# Patient Record
Sex: Male | Born: 1960
Health system: Southern US, Community
[De-identification: ages and names within clinical notes are randomized; demographics above are authoritative.]

## PROBLEM LIST (undated history)

## (undated) DIAGNOSIS — Z95820 Peripheral vascular angioplasty status with implants and grafts: Secondary | ICD-10-CM

## (undated) DIAGNOSIS — M069 Rheumatoid arthritis, unspecified: Secondary | ICD-10-CM

## (undated) DIAGNOSIS — E785 Hyperlipidemia, unspecified: Secondary | ICD-10-CM

## (undated) DIAGNOSIS — I314 Cardiac tamponade: Secondary | ICD-10-CM

## (undated) DIAGNOSIS — I313 Pericardial effusion (noninflammatory): Secondary | ICD-10-CM

## (undated) DIAGNOSIS — I2510 Atherosclerotic heart disease of native coronary artery without angina pectoris: Secondary | ICD-10-CM

## (undated) DIAGNOSIS — Z720 Tobacco use: Secondary | ICD-10-CM

## (undated) DIAGNOSIS — I2102 ST elevation (STEMI) myocardial infarction involving left anterior descending coronary artery: Secondary | ICD-10-CM

## (undated) DIAGNOSIS — I255 Ischemic cardiomyopathy: Secondary | ICD-10-CM

## (undated) HISTORY — DX: Rheumatoid arthritis, unspecified: M06.9

## (undated) HISTORY — PX: SHOULDER SURGERY: SHX246

---

## 2015-04-27 DIAGNOSIS — S46211A Strain of muscle, fascia and tendon of other parts of biceps, right arm, initial encounter: Secondary | ICD-10-CM | POA: Insufficient documentation

## 2015-06-12 ENCOUNTER — Encounter (HOSPITAL_COMMUNITY): Payer: Self-pay | Admitting: Family Medicine

## 2015-06-12 ENCOUNTER — Inpatient Hospital Stay (HOSPITAL_COMMUNITY)
Admission: EM | Admit: 2015-06-12 | Discharge: 2015-06-14 | DRG: 247 | Disposition: A | Discharge status: Home / Self Care | Payer: Medicaid Other | Attending: Cardiovascular Disease | Admitting: Cardiovascular Disease

## 2015-06-12 ENCOUNTER — Encounter (HOSPITAL_COMMUNITY)
Admission: EM | Disposition: A | Discharge status: Home / Self Care | Payer: Medicaid Other | Source: Home / Self Care | Attending: Cardiovascular Disease

## 2015-06-12 DIAGNOSIS — I2510 Atherosclerotic heart disease of native coronary artery without angina pectoris: Secondary | ICD-10-CM | POA: Diagnosis present

## 2015-06-12 DIAGNOSIS — I255 Ischemic cardiomyopathy: Secondary | ICD-10-CM | POA: Diagnosis present

## 2015-06-12 DIAGNOSIS — I219 Acute myocardial infarction, unspecified: Secondary | ICD-10-CM

## 2015-06-12 DIAGNOSIS — I2102 ST elevation (STEMI) myocardial infarction involving left anterior descending coronary artery: Principal | ICD-10-CM | POA: Diagnosis present

## 2015-06-12 DIAGNOSIS — Z791 Long term (current) use of non-steroidal anti-inflammatories (NSAID): Secondary | ICD-10-CM

## 2015-06-12 DIAGNOSIS — I213 ST elevation (STEMI) myocardial infarction of unspecified site: Secondary | ICD-10-CM

## 2015-06-12 DIAGNOSIS — E785 Hyperlipidemia, unspecified: Secondary | ICD-10-CM | POA: Diagnosis present

## 2015-06-12 DIAGNOSIS — Z7952 Long term (current) use of systemic steroids: Secondary | ICD-10-CM

## 2015-06-12 DIAGNOSIS — F17210 Nicotine dependence, cigarettes, uncomplicated: Secondary | ICD-10-CM | POA: Diagnosis present

## 2015-06-12 DIAGNOSIS — Z95820 Peripheral vascular angioplasty status with implants and grafts: Secondary | ICD-10-CM

## 2015-06-12 DIAGNOSIS — I2109 ST elevation (STEMI) myocardial infarction involving other coronary artery of anterior wall: Secondary | ICD-10-CM | POA: Diagnosis not present

## 2015-06-12 DIAGNOSIS — R079 Chest pain, unspecified: Secondary | ICD-10-CM | POA: Diagnosis not present

## 2015-06-12 HISTORY — DX: ST elevation (STEMI) myocardial infarction involving left anterior descending coronary artery: I21.02

## 2015-06-12 HISTORY — DX: Tobacco use: Z72.0

## 2015-06-12 HISTORY — PX: CARDIAC CATHETERIZATION: SHX172

## 2015-06-12 HISTORY — DX: Peripheral vascular angioplasty status with implants and grafts: Z95.820

## 2015-06-12 HISTORY — DX: Ischemic cardiomyopathy: I25.5

## 2015-06-12 HISTORY — DX: Hyperlipidemia, unspecified: E78.5

## 2015-06-12 HISTORY — DX: Atherosclerotic heart disease of native coronary artery without angina pectoris: I25.10

## 2015-06-12 LAB — COMPREHENSIVE METABOLIC PANEL
ALT: 17 U/L (ref 17–63)
ANION GAP: 13 (ref 5–15)
AST: 25 U/L (ref 15–41)
Albumin: 4.3 g/dL (ref 3.5–5.0)
Alkaline Phosphatase: 86 U/L (ref 38–126)
BILIRUBIN TOTAL: 0.3 mg/dL (ref 0.3–1.2)
BUN: 20 mg/dL (ref 6–20)
CALCIUM: 9.2 mg/dL (ref 8.9–10.3)
CO2: 23 mmol/L (ref 22–32)
CREATININE: 0.82 mg/dL (ref 0.61–1.24)
Chloride: 102 mmol/L (ref 101–111)
GFR calc non Af Amer: >60 mL/min (ref >60)
Glucose, Bld: 161 mg/dL — ABNORMAL HIGH (ref 65–99)
POTASSIUM: 3.5 mmol/L (ref 3.5–5.1)
Sodium: 138 mmol/L (ref 135–145)
TOTAL PROTEIN: 7 g/dL (ref 6.5–8.1)

## 2015-06-12 LAB — CBC
HCT: 47.4 % (ref 39.0–52.0)
Hemoglobin: 16.5 g/dL (ref 13.0–17.0)
MCH: 34.5 pg — AB (ref 26.0–34.0)
MCHC: 34.8 g/dL (ref 30.0–36.0)
MCV: 99.2 fL (ref 78.0–100.0)
PLATELETS: 255 10*3/uL (ref 150–400)
RBC: 4.78 MIL/uL (ref 4.22–5.81)
RDW: 12.6 % (ref 11.5–15.5)
WBC: 19.6 10*3/uL — ABNORMAL HIGH (ref 4.0–10.5)

## 2015-06-12 LAB — PROTIME-INR
INR: 1.08 (ref 0.00–1.49)
PROTHROMBIN TIME: 14.2 s (ref 11.6–15.2)

## 2015-06-12 LAB — I-STAT TROPONIN, ED: TROPONIN I, POC: 0.67 ng/mL — AB (ref 0.00–0.08)

## 2015-06-12 LAB — TROPONIN I

## 2015-06-12 LAB — APTT: APTT: 33 s (ref 24–37)

## 2015-06-12 SURGERY — LEFT HEART CATH AND CORONARY ANGIOGRAPHY
Anesthesia: LOCAL

## 2015-06-12 MED ORDER — MORPHINE SULFATE 2 MG/ML IJ SOLN: 2.0000 mg | INTRAMUSCULAR | Status: DC | PRN

## 2015-06-12 MED ORDER — IOHEXOL 350 MG/ML SOLN
INTRAVENOUS | Status: DC | PRN
  Administered 2015-06-12: 135 mL via INTRA_ARTERIAL

## 2015-06-12 MED ORDER — ASPIRIN 81 MG PO CHEW
CHEWABLE_TABLET | ORAL | Status: AC
  Administered 2015-06-12: 324 mg via ORAL
  Filled 2015-06-12: qty 4

## 2015-06-12 MED ORDER — BIVALIRUDIN 250 MG IV SOLR
INTRAVENOUS | Status: AC
  Filled 2015-06-12: qty 250

## 2015-06-12 MED ORDER — SODIUM CHLORIDE 0.9 % IJ SOLN
3.0000 mL | Freq: Two times a day (BID) | INTRAMUSCULAR | Status: DC
Start: 2015-06-12 — End: 2015-06-14
  Administered 2015-06-12 – 2015-06-13 (×3): 3 mL via INTRAVENOUS

## 2015-06-12 MED ORDER — MORPHINE SULFATE 4 MG/ML IJ SOLN
4.0000 mg | INTRAMUSCULAR | Status: DC | PRN
  Administered 2015-06-12: 4 mg via INTRAVENOUS

## 2015-06-12 MED ORDER — ONDANSETRON HCL 4 MG/2ML IJ SOLN: 4.0000 mg | Freq: Four times a day (QID) | INTRAMUSCULAR | Status: DC | PRN

## 2015-06-12 MED ORDER — FENTANYL CITRATE (PF) 100 MCG/2ML IJ SOLN
INTRAMUSCULAR | Status: DC | PRN
  Administered 2015-06-12: 50 ug via INTRAVENOUS

## 2015-06-12 MED ORDER — MORPHINE SULFATE 2 MG/ML IJ SOLN
INTRAMUSCULAR | Status: AC
  Filled 2015-06-12: qty 2

## 2015-06-12 MED ORDER — TICAGRELOR 90 MG PO TABS
ORAL_TABLET | ORAL | Status: AC
  Filled 2015-06-12: qty 2

## 2015-06-12 MED ORDER — SODIUM CHLORIDE 0.9 % IV SOLN: 250.0000 mL | INTRAVENOUS | Status: DC | PRN

## 2015-06-12 MED ORDER — VERAPAMIL HCL 2.5 MG/ML IV SOLN
INTRAVENOUS | Status: AC
  Filled 2015-06-12: qty 2

## 2015-06-12 MED ORDER — NITROGLYCERIN IN D5W 200-5 MCG/ML-% IV SOLN
5.0000 ug/min | INTRAVENOUS | Status: DC
  Administered 2015-06-12: 5 ug/min via INTRAVENOUS

## 2015-06-12 MED ORDER — RADIAL COCKTAIL (HEPARIN/VERAPAMIL/LIDOCAINE/NITRO)
Status: DC | PRN
  Administered 2015-06-12: 1 via INTRA_ARTERIAL

## 2015-06-12 MED ORDER — ACETAMINOPHEN 325 MG PO TABS: 650.0000 mg | ORAL_TABLET | ORAL | Status: DC | PRN

## 2015-06-12 MED ORDER — SODIUM CHLORIDE 0.9 % IV SOLN
250.0000 mg | INTRAVENOUS | Status: DC | PRN
  Administered 2015-06-12: 1.75 mg/kg/h via INTRAVENOUS
  Administered 2015-06-12: 250 mg

## 2015-06-12 MED ORDER — ASPIRIN 81 MG PO CHEW
81.0000 mg | CHEWABLE_TABLET | Freq: Every day | ORAL | Status: DC
  Administered 2015-06-13 – 2015-06-14 (×2): 81 mg via ORAL
  Filled 2015-06-12 (×2): qty 1

## 2015-06-12 MED ORDER — SODIUM CHLORIDE 0.9 % WEIGHT BASED INFUSION: 1.0000 mL/kg/h | INTRAVENOUS | Status: AC

## 2015-06-12 MED ORDER — NITROGLYCERIN IN D5W 200-5 MCG/ML-% IV SOLN
INTRAVENOUS | Status: AC
  Administered 2015-06-12: 5 ug/min via INTRAVENOUS
  Filled 2015-06-12: qty 250

## 2015-06-12 MED ORDER — TICAGRELOR 90 MG PO TABS
ORAL_TABLET | ORAL | Status: DC | PRN
  Administered 2015-06-12: 180 mg via ORAL

## 2015-06-12 MED ORDER — BIVALIRUDIN BOLUS VIA INFUSION - CUPID
INTRAVENOUS | Status: DC | PRN
  Administered 2015-06-12: 57.825 mg via INTRAVENOUS

## 2015-06-12 MED ORDER — HEPARIN SODIUM (PORCINE) 5000 UNIT/ML IJ SOLN
INTRAMUSCULAR | Status: AC
  Administered 2015-06-12: 4000 [IU] via INTRAVENOUS
  Filled 2015-06-12: qty 1

## 2015-06-12 MED ORDER — ATORVASTATIN CALCIUM 80 MG PO TABS
80.0000 mg | ORAL_TABLET | Freq: Every day | ORAL | Status: DC
Start: 2015-06-12 — End: 2015-06-14
  Administered 2015-06-13: 80 mg via ORAL
  Filled 2015-06-12 (×2): qty 1

## 2015-06-12 MED ORDER — ONDANSETRON HCL 4 MG/2ML IJ SOLN
4.0000 mg | Freq: Once | INTRAMUSCULAR | Status: AC
  Administered 2015-06-12: 4 mg via INTRAVENOUS

## 2015-06-12 MED ORDER — SODIUM CHLORIDE 0.9 % IV SOLN
INTRAVENOUS | Status: DC
  Administered 2015-06-12: 20 mL/h via INTRAVENOUS

## 2015-06-12 MED ORDER — FENTANYL CITRATE (PF) 100 MCG/2ML IJ SOLN
INTRAMUSCULAR | Status: AC
  Filled 2015-06-12: qty 4

## 2015-06-12 MED ORDER — SODIUM CHLORIDE 0.9 % IJ SOLN: 3.0000 mL | INTRAMUSCULAR | Status: DC | PRN

## 2015-06-12 MED ORDER — PNEUMOCOCCAL VAC POLYVALENT 25 MCG/0.5ML IJ INJ
0.5000 mL | INJECTION | INTRAMUSCULAR | Status: AC
  Administered 2015-06-13: 0.5 mL via INTRAMUSCULAR
  Filled 2015-06-12: qty 0.5

## 2015-06-12 MED ORDER — LIDOCAINE HCL (PF) 1 % IJ SOLN
INTRAMUSCULAR | Status: AC
  Filled 2015-06-12: qty 30

## 2015-06-12 MED ORDER — HEPARIN SODIUM (PORCINE) 5000 UNIT/ML IJ SOLN
4000.0000 [IU] | INTRAMUSCULAR | Status: AC
  Administered 2015-06-12: 4000 [IU] via INTRAVENOUS

## 2015-06-12 MED ORDER — SODIUM CHLORIDE 0.9 % IV SOLN
1.0000 mg/kg/h | INTRAVENOUS | Status: AC
  Filled 2015-06-12: qty 250

## 2015-06-12 MED ORDER — ASPIRIN 81 MG PO CHEW
324.0000 mg | CHEWABLE_TABLET | Freq: Once | ORAL | Status: AC
  Administered 2015-06-12: 324 mg via ORAL

## 2015-06-12 MED ORDER — ONDANSETRON HCL 4 MG/2ML IJ SOLN
INTRAMUSCULAR | Status: AC
  Filled 2015-06-12: qty 2

## 2015-06-12 MED ORDER — TICAGRELOR 90 MG PO TABS
90.0000 mg | ORAL_TABLET | Freq: Two times a day (BID) | ORAL | Status: DC
  Administered 2015-06-12 – 2015-06-14 (×4): 90 mg via ORAL
  Filled 2015-06-12 (×5): qty 1

## 2015-06-12 MED ORDER — HEPARIN (PORCINE) IN NACL 2-0.9 UNIT/ML-% IJ SOLN
INTRAMUSCULAR | Status: AC
  Filled 2015-06-12: qty 1000

## 2015-06-12 SURGICAL SUPPLY — 20 items
BALLN MINITREK RX 2.0X12 (BALLOONS) ×2
BALLN ~~LOC~~ EUPHORA RX 2.75X12 (BALLOONS) ×2
BALLOON MINITREK RX 2.0X12 (BALLOONS) ×1 IMPLANT
BALLOON ~~LOC~~ EUPHORA RX 2.75X12 (BALLOONS) ×1 IMPLANT
CATH INFINITI 5FR ANG PIGTAIL (CATHETERS) ×2 IMPLANT
CATH INFINITI 5FR MULTPACK ANG (CATHETERS) ×2 IMPLANT
CATH OPTITORQUE TIG 4.0 5F (CATHETERS) ×2 IMPLANT
CATH VISTA GUIDE 6FR XBLAD3.5 (CATHETERS) ×2 IMPLANT
DEVICE RAD COMP TR BAND LRG (VASCULAR PRODUCTS) ×2 IMPLANT
GLIDESHEATH SLEND A-KIT 6F 22G (SHEATH) ×2 IMPLANT
KIT ENCORE 26 ADVANTAGE (KITS) ×2 IMPLANT
KIT HEART LEFT (KITS) ×2 IMPLANT
PACK CARDIAC CATHETERIZATION (CUSTOM PROCEDURE TRAY) ×2 IMPLANT
STENT XIENCE ALPINE RX 2.5X15 (Permanent Stent) ×2 IMPLANT
SYR MEDRAD MARK V 150ML (SYRINGE) ×2 IMPLANT
TRANSDUCER W/STOPCOCK (MISCELLANEOUS) ×2 IMPLANT
TUBING CIL FLEX 10 FLL-RA (TUBING) ×2 IMPLANT
WIRE ASAHI PROWATER 180CM (WIRE) ×2 IMPLANT
WIRE HI TORQ VERSACORE-J 145CM (WIRE) ×2 IMPLANT
WIRE SAFE-T 1.5MM-J .035X260CM (WIRE) ×2 IMPLANT

## 2015-06-12 NOTE — H&P (Signed)
CARDIOLOGY ADMISSION NOTE  Patient ID: Devin Ellis MRN: [id number] DOB/AGE: 12-04-1960 54 y.o.  Admit date: 06/12/2015 Primary Physician   Dennie Fetters Primary Cardiologist   New Chief Complaint    Chest pain  HPI:  The patient presents with two hours of chest pain after moving a bed.  He and his wife are moving to a new apartment.  He has no past medical history other than tobacco.  He reports 10/10 chest and left arm pain.  He denies an associated N/V or diaphoresis.  He came to the ED and is noted to have ST elevation V2 - V4 of 4 mm with 5 - 10 mm inferior ST depression.  Treated with ASA and heparin.  Code STEMI activated.   Past Medical History  Diagnosis Date  . Tobacco abuse     Past Surgical History  Procedure Laterality Date  . Shoulder surgery Right     Torn bicep    No Known Allergies No current facility-administered medications on file prior to encounter.   MEDS:  Occasional pain med for arm   Social History   Social History  . Marital Status: N/A    Spouse Name: N/A  . Number of Children: 5  . Years of Education: N/A   Occupational History  . Not on file.   Social History Main Topics  . Smoking status: Current Every Day Smoker -- 1.50 packs/day for 30 years    Types: Cigarettes  . Smokeless tobacco: Not on file  . Alcohol Use: Not on file  . Drug Use: Not on file  . Sexual Activity: Not on file   Other Topics Concern  . Not on file   Social History Narrative   Married.  Five children total between him and his wife.  He works for [employer] in [employer].      FH:  No early CAD   ROS:  As stated in the HPI and negative for all other systems.  Physical Exam: Blood pressure 157/91, pulse 80, resp. rate 18, height 6\' 1"  (1.854 m), weight 170 lb (77.111 kg), SpO2 97 %.  GENERAL:  Well appearing HEENT:  Pupils equal round and reactive, fundi not visualized, oral mucosa unremarkable NECK:  No jugular venous distention, waveform within normal  limits, carotid upstroke brisk and symmetric, no bruits, no thyromegaly LYMPHATICS:  No cervical, inguinal adenopathy LUNGS:  Clear to auscultation bilaterally BACK:  No CVA tenderness CHEST:  Unremarkable HEART:  PMI not displaced or sustained,S1 and S2 within normal limits, no S3, no S4, no clicks, no rubs, no murmurs ABD:  Flat, positive bowel sounds normal in frequency in pitch, no bruits, no rebound, no guarding, no midline pulsatile mass, no hepatomegaly, no splenomegaly EXT:  2 plus pulses throughout, no edema, no cyanosis no clubbing SKIN:  No rashes no nodules NEURO:  Cranial nerves II through XII grossly intact, motor grossly intact throughout PSYCH:  Cognitively intact, oriented to person place and time    Labs: No results found for: BUN No results found for: CREATININE No results found for: NA, K, CL, CO2 No results found for: TROPONINI Lab Results  Component Value Date   WBC 19.6* 06/12/2015   HGB 16.5 06/12/2015   HCT 47.4 06/12/2015   MCV 99.2 06/12/2015   PLT 255 06/12/2015   No results found for: CHOL, HDL, LDLCALC, LDLDIRECT, TRIG, CHOLHDL No results found for: ALT, AST, GGT, ALKPHOS, BILITOT   EKG:  NSR, rate 88, axis WNL, intervals WNL,  acute anterior ST elevation MI with reciprocal inferior ST depression  ASSESSMENT AND PLAN:    Acute anterior MI:  The patient is to be taken urgently to the cath lab.  The patient understands that risks included but are not limited to stroke (1 in 1000), death (1 in 1000), kidney failure [usually temporary] (1 in 500), bleeding (1 in 200), allergic reaction [possibly serious] (1 in 200).  The patient understands and agrees to proceed.   Tobacco abuse:  He will be educated.  Risk reduction:  Check lipid profile and start statin.    SignedRollene Rotunda 06/12/2015, 4:36 PM

## 2015-06-12 NOTE — ED Notes (Signed)
[facility] contacted to call Code Stemi 

## 2015-06-12 NOTE — ED Notes (Signed)
Pt here for chest pain that started this am. Pt code stemi.

## 2015-06-12 NOTE — ED Notes (Signed)
Cardiologist at bedside.  

## 2015-06-12 NOTE — Progress Notes (Signed)
Chaplain offered support and orientation to [employer] process and facilitated storytelling.  Accompanied wife to waiting area and returned to escort wife's daughter ([redacted] weeks pregnant) to waiting area.  Patient and wife were in the middle of moving to another apartment within the same complex when patient's pain started about 4 hours ago.  Wife is anxious and teary. She reports concern about getting moved out of the old apartment in time so they don't get charged for two places, about financial and employment status of patient and over her children.  In addition to wife's daughter, wife has a son (32) who is very close to his stepfather (patient) and another son (73, assumed to be biological son of patient bc patient and wife have been married for 12 years).  This youngest son has not been told what is happening, but he is with his 49 year old step-brother who knows situation.  Youngest son is also very close to his father. Chaplain encouraged mom to communicate some basic information to younger son, for example that dad had to come to the hospital to be checked out and is getting taken care, because children will usually fill in information gaps with worst-case scenarios. Chaplain explained that our services are available at any time, day or night.  Please contact for f/u care as needed.   Theodoro Parma [phone]   06/12/15 1700  Clinical Encounter Type  Visited With Patient and family together  Visit Type Initial;Psychological support (STEMI)  Stress Factors  Patient Stress Factors Health changes  Family Stress Factors Lack of knowledge;Exhausted

## 2015-06-12 NOTE — ED Provider Notes (Signed)
CSN: [id number]     Arrival date & time 06/12/15  1555 History   First MD Initiated Contact with Patient 06/12/15 1627     Chief Complaint  Patient presents with  . Code STEMI     HPI  Patient presents for evaluation she complained of chest pain. Patient presents via triage and was brought emergently to room after triage with complaint of chest pain. Symptoms started 2 hours ago while at home at rest. Pressure in his anterior chest rating to his left arm associated shortness of breath. No syncope. No nausea. No cough fever or other recent illnesses.  History of heart disease. Denies history of hypertension or diabetes. No family history of heart disease. [name] a 1.5 pack per day smoker for over 30 years.  Past Medical History  Diagnosis Date  . Tobacco abuse    Past Surgical History  Procedure Laterality Date  . Shoulder surgery Right     Torn bicep   History reviewed. No pertinent family history. Social History  Substance Use Topics  . Smoking status: Current Every Day Smoker -- 1.50 packs/day for 30 years    Types: Cigarettes  . Smokeless tobacco: None  . Alcohol Use: None    Review of Systems  Constitutional: Negative for fever, chills, diaphoresis, appetite change and fatigue.  HENT: Negative for mouth sores, sore throat and trouble swallowing.   Eyes: Negative for visual disturbance.  Respiratory: Positive for chest tightness and shortness of breath. Negative for cough and wheezing.   Cardiovascular: Positive for chest pain.  Gastrointestinal: Positive for nausea. Negative for vomiting, abdominal pain, diarrhea and abdominal distention.  Endocrine: Negative for polydipsia, polyphagia and polyuria.  Genitourinary: Negative for dysuria, frequency and hematuria.  Musculoskeletal: Negative for gait problem.  Skin: Negative for color change, pallor and rash.  Neurological: Negative for dizziness, syncope, light-headedness and headaches.  Hematological: Does not  bruise/bleed easily.  Psychiatric/Behavioral: Negative for behavioral problems and confusion.      Allergies  Review of patient's allergies indicates no known allergies.  Home Medications   Prior to Admission medications   Medication Sig Start Date End Date Taking? Authorizing Provider  meloxicam (MOBIC) 15 MG tablet Take 15 mg by mouth at bedtime.  04/27/15  Yes Historical Provider, MD  naproxen sodium (ANAPROX) 220 MG tablet Take 440 mg by mouth 3 (three) times daily as needed (for pain).   Yes Historical Provider, MD  predniSONE (DELTASONE) 10 MG tablet Take 5 mg by mouth every other day. 04/27/15  Yes Historical Provider, MD   BP 137/89 mmHg  Pulse 80  Temp(Src) 98.6 F (37 C) (Oral)  Resp 17  Ht 6' (1.829 m)  Wt 169 lb 15.6 oz (77.1 kg)  BMI 23.05 kg/m2  SpO2 98% Physical Exam  Constitutional: [name] oriented to person, place, and time. He appears well-developed and well-nourished. No distress.  Appears uncomfortable. Appears acutely distressed. [name] diaphoretic.  HENT:  Head: Normocephalic.  Eyes: Conjunctivae are normal. Pupils are equal, round, and reactive to light. No scleral icterus.  Neck: Normal range of motion. Neck supple. No thyromegaly present.  Cardiovascular: Normal rate, regular rhythm, S1 normal and S2 normal.  Exam reveals no gallop and no friction rub.   No murmur heard. No murmurs. Sinus rhythm. Regular.  Pulmonary/Chest: Effort normal and breath sounds normal. No respiratory distress. He has no wheezes. He has no rales.  Clear bilateral breath sounds.  Abdominal: Soft. Bowel sounds are normal. He exhibits no distension. There  is no tenderness. There is no rebound.  Musculoskeletal: Normal range of motion.  Neurological: [name] alert and oriented to person, place, and time.  Skin: Skin is warm and dry. No rash noted.  Psychiatric: He has a normal mood and affect. His behavior is normal.    ED Course  Procedures (including critical care time) Labs  Review Labs Reviewed  CBC - Abnormal; Notable for the following:    WBC 19.6 (*)    MCH 34.5 (*)    All other components within normal limits  COMPREHENSIVE METABOLIC PANEL - Abnormal; Notable for the following:    Glucose, Bld 161 (*)    All other components within normal limits  TROPONIN I - Abnormal; Notable for the following:    Troponin I >65.00 (*)    All other components within normal limits  I-STAT TROPOININ, ED - Abnormal; Notable for the following:    Troponin i, poc 0.67 (*)    All other components within normal limits  MRSA PCR SCREENING  APTT  PROTIME-INR  BASIC METABOLIC PANEL  CBC  TROPONIN I  TROPONIN I    Imaging Review No results found. Suan Halter, [employer], personally reviewed and evaluated these images and lab results as part of my medical decision-making. EKG: normal EKG, normal sinus rhythm, unchanged from previous tracings, ST elevation in anterior leads greater than 37mm.   MDM   Final diagnoses:  ST elevation myocardial infarction (STEMI), unspecified artery    Immediately upon being shown the EKG when directly patient's room. [name] complaining of chest pain. Immediately requested that a code STEMI be placed. Dr. Gery Pray did return my call. However, Dr. Antoine Poche was immediately available in the emergency room, and has "a consultation while awaiting Dr. Benay Spice arrival.   After my initial evaluation and history the patient was given heparin bolus. Was given morphine and Zofran IV. Was given aspirin by mouth.  CRITICAL CARE Performed by: Rolland Porter JOSEPH   Total critical care time: 30 minutes.  Initial evaluation. Activation of code STEMI. Heparin bolus.  Critical care time was exclusive of separately billable procedures and treating other patients.  Critical care was necessary to treat or prevent imminent or life-threatening deterioration.  Critical care was time spent personally by me on the following activities: development of treatment  plan with patient and/or surrogate as well as nursing, discussions with consultants, evaluation of patient's response to treatment, examination of patient, obtaining history from patient or surrogate, ordering and performing treatments and interventions, ordering and review of laboratory studies, ordering and review of radiographic studies, pulse oximetry and re-evaluation of patient's condition.    Rolland Porter, MD 06/13/15 [phone]

## 2015-06-13 ENCOUNTER — Inpatient Hospital Stay (HOSPITAL_COMMUNITY): Payer: Medicaid Other

## 2015-06-13 DIAGNOSIS — I219 Acute myocardial infarction, unspecified: Secondary | ICD-10-CM

## 2015-06-13 DIAGNOSIS — I213 ST elevation (STEMI) myocardial infarction of unspecified site: Secondary | ICD-10-CM

## 2015-06-13 LAB — CBC
HEMATOCRIT: 43.4 % (ref 39.0–52.0)
HEMOGLOBIN: 14.9 g/dL (ref 13.0–17.0)
MCH: 34.3 pg — ABNORMAL HIGH (ref 26.0–34.0)
MCHC: 34.3 g/dL (ref 30.0–36.0)
MCV: 99.8 fL (ref 78.0–100.0)
PLATELETS: 212 10*3/uL (ref 150–400)
RBC: 4.35 MIL/uL (ref 4.22–5.81)
RDW: 13 % (ref 11.5–15.5)
WBC: 14.1 10*3/uL — AB (ref 4.0–10.5)

## 2015-06-13 LAB — BASIC METABOLIC PANEL
Anion gap: 8 (ref 5–15)
BUN: 16 mg/dL (ref 6–20)
CALCIUM: 8.8 mg/dL — AB (ref 8.9–10.3)
CO2: 23 mmol/L (ref 22–32)
CREATININE: 0.61 mg/dL (ref 0.61–1.24)
Chloride: 106 mmol/L (ref 101–111)
GFR calc Af Amer: >60 mL/min (ref >60)
GFR calc non Af Amer: >60 mL/min (ref >60)
Glucose, Bld: 115 mg/dL — ABNORMAL HIGH (ref 65–99)
POTASSIUM: 4 mmol/L (ref 3.5–5.1)
Sodium: 137 mmol/L (ref 135–145)

## 2015-06-13 LAB — MRSA PCR SCREENING: MRSA by PCR: NEGATIVE

## 2015-06-13 LAB — TROPONIN I: TROPONIN I: 26.93 ng/mL — AB (ref <0.031)

## 2015-06-13 MED ORDER — LISINOPRIL 2.5 MG PO TABS
2.5000 mg | ORAL_TABLET | Freq: Every day | ORAL | Status: DC
  Administered 2015-06-13 – 2015-06-14 (×2): 2.5 mg via ORAL
  Filled 2015-06-13 (×2): qty 1

## 2015-06-13 MED ORDER — CARVEDILOL 3.125 MG PO TABS
3.1250 mg | ORAL_TABLET | Freq: Two times a day (BID) | ORAL | Status: DC
  Administered 2015-06-13 – 2015-06-14 (×2): 3.125 mg via ORAL
  Filled 2015-06-13 (×2): qty 1

## 2015-06-13 NOTE — Progress Notes (Signed)
SUBJECTIVE:  No further chest pain.  Breathing OK.     PHYSICAL EXAM Filed Vitals:   06/13/15 0500 06/13/15 0600 06/13/15 0700 06/13/15 0800  BP: 141/90 132/92 136/90 146/77  Pulse: 82 87 93 97  Temp:    98.6 F (37 C)  TempSrc:    Oral  Resp: 19 14 18 21   Height:      Weight:      SpO2: 95% 95% 97% 96%   General:  No distress Lungs:  Clear Heart:  RRR Abdomen:  Positive bowel sounds, no rebound no guarding Extremities:  No edema, right wrist without bruising.  LABS: Lab Results  Component Value Date   TROPONINI >65.00* 06/13/2015   Results for orders placed or performed during the hospital encounter of 06/12/15 (from the past 24 hour(s))  APTT     Status: None   Collection Time: 06/12/15  4:14 PM  Result Value Ref Range   aPTT 33 24 - 37 seconds  CBC     Status: Abnormal   Collection Time: 06/12/15  4:14 PM  Result Value Ref Range   WBC 19.6 (H) 4.0 - 10.5 K/uL   RBC 4.78 4.22 - 5.81 MIL/uL   Hemoglobin 16.5 13.0 - 17.0 g/dL   HCT 06/14/15 76.1 - 95.0 %   MCV 99.2 78.0 - 100.0 fL   MCH 34.5 (H) 26.0 - 34.0 pg   MCHC 34.8 30.0 - 36.0 g/dL   RDW 93.2 67.1 - 24.5 %   Platelets 255 150 - 400 K/uL  Comprehensive metabolic panel     Status: Abnormal   Collection Time: 06/12/15  4:14 PM  Result Value Ref Range   Sodium 138 135 - 145 mmol/L   Potassium 3.5 3.5 - 5.1 mmol/L   Chloride 102 101 - 111 mmol/L   CO2 23 22 - 32 mmol/L   Glucose, Bld 161 (H) 65 - 99 mg/dL   BUN 20 6 - 20 mg/dL   Creatinine, Ser 06/14/15 0.61 - 1.24 mg/dL   Calcium 9.2 8.9 - 9.83 mg/dL   Total Protein 7.0 6.5 - 8.1 g/dL   Albumin 4.3 3.5 - 5.0 g/dL   AST 25 15 - 41 U/L   ALT 17 17 - 63 U/L   Alkaline Phosphatase 86 38 - 126 U/L   Total Bilirubin 0.3 0.3 - 1.2 mg/dL   GFR calc non Af Amer >60 >60 mL/min   GFR calc Af Amer >60 >60 mL/min   Anion gap 13 5 - 15  Protime-INR     Status: None   Collection Time: 06/12/15  4:14 PM  Result Value Ref Range   Prothrombin Time 14.2 11.6 - 15.2  seconds   INR 1.08 0.00 - 1.49  I-Stat Troponin, ED (not at [facility], [facility])     Status: Abnormal   Collection Time: 06/12/15  4:19 PM  Result Value Ref Range   Troponin i, poc 0.67 (HH) 0.00 - 0.08 ng/mL   Comment NOTIFIED PHYSICIAN    Comment 3          Troponin I (serum)     Status: Abnormal   Collection Time: 06/12/15  7:44 PM  Result Value Ref Range   Troponin I >65.00 (HH) <0.031 ng/mL  MRSA PCR Screening     Status: None   Collection Time: 06/12/15  7:55 PM  Result Value Ref Range   MRSA by PCR NEGATIVE NEGATIVE  Troponin I (q 6hr x 3)     Status:  Abnormal   Collection Time: 06/13/15  1:00 AM  Result Value Ref Range   Troponin I >65.00 (HH) <0.031 ng/mL  CBC     Status: Abnormal   Collection Time: 06/13/15  8:25 AM  Result Value Ref Range   WBC 14.1 (H) 4.0 - 10.5 K/uL   RBC 4.35 4.22 - 5.81 MIL/uL   Hemoglobin 14.9 13.0 - 17.0 g/dL   HCT 10.2 72.5 - 36.6 %   MCV 99.8 78.0 - 100.0 fL   MCH 34.3 (H) 26.0 - 34.0 pg   MCHC 34.3 30.0 - 36.0 g/dL   RDW 44.0 34.7 - 42.5 %   Platelets 212 150 - 400 K/uL    Intake/Output Summary (Last 24 hours) at 06/13/15 9563 Last data filed at 06/13/15 0854  Gross per 24 hour  Intake 820.28 ml  Output    800 ml  Net  20.28 ml    EKG:    NSR, rate 84, axis WNL, resolution of ST elevation, lateral and septal Q waves.  06/13/2015  ASSESSMENT AND PLAN:  ACUTE ANTERIOR MI:  PCI/DES:  Resolution of ST changes and pain.   Cardiac rehab to see.   Start low dose beta blocker and low dose ACE inhibitor.    ISCHEMIC CARDIOMYOPATHY:  Meds as above.  Discussed with the patient.    TOBACCO ABUSE:  Educated.  He thinks that he is done smoking.  RISK REDUCTION:  Needs lipid profile.   Devin Ellis 06/13/2015 9:22 AM

## 2015-06-13 NOTE — Progress Notes (Signed)
  Echocardiogram 2D Echocardiogram has been performed.  Delcie Roch 06/13/2015, 2:07 PM

## 2015-06-13 NOTE — Progress Notes (Signed)
CRITICAL VALUE ALERT  Critical value received: trop. >65  Date of notification:  06/12/15  Time of notification: 2000  Critical value read back:yes Nurse who received alert:  Paulita Fujita   MD notified (1st page):  Sullivan Lone  Time of first page:2000  MD notified (2nd page):  Time of second page:  Responding MD:    Time MD responded:  2000

## 2015-06-14 ENCOUNTER — Encounter (HOSPITAL_COMMUNITY): Payer: Self-pay | Admitting: Cardiovascular Disease

## 2015-06-14 ENCOUNTER — Telehealth: Payer: Self-pay | Admitting: Physician Assistant

## 2015-06-14 ENCOUNTER — Other Ambulatory Visit: Payer: Self-pay | Admitting: Cardiology

## 2015-06-14 DIAGNOSIS — E785 Hyperlipidemia, unspecified: Secondary | ICD-10-CM

## 2015-06-14 DIAGNOSIS — I2102 ST elevation (STEMI) myocardial infarction involving left anterior descending coronary artery: Secondary | ICD-10-CM

## 2015-06-14 DIAGNOSIS — Z720 Tobacco use: Secondary | ICD-10-CM

## 2015-06-14 DIAGNOSIS — I10 Essential (primary) hypertension: Secondary | ICD-10-CM

## 2015-06-14 DIAGNOSIS — Z95820 Peripheral vascular angioplasty status with implants and grafts: Secondary | ICD-10-CM

## 2015-06-14 DIAGNOSIS — F17210 Nicotine dependence, cigarettes, uncomplicated: Secondary | ICD-10-CM | POA: Diagnosis present

## 2015-06-14 DIAGNOSIS — R82998 Other abnormal findings in urine: Secondary | ICD-10-CM

## 2015-06-14 DIAGNOSIS — I2510 Atherosclerotic heart disease of native coronary artery without angina pectoris: Secondary | ICD-10-CM

## 2015-06-14 DIAGNOSIS — I255 Ischemic cardiomyopathy: Secondary | ICD-10-CM

## 2015-06-14 HISTORY — DX: Ischemic cardiomyopathy: I25.5

## 2015-06-14 HISTORY — DX: Hyperlipidemia, unspecified: E78.5

## 2015-06-14 HISTORY — DX: Peripheral vascular angioplasty status with implants and grafts: Z95.820

## 2015-06-14 HISTORY — DX: Atherosclerotic heart disease of native coronary artery without angina pectoris: I25.10

## 2015-06-14 LAB — LIPID PANEL
CHOL/HDL RATIO: 3 ratio
Cholesterol: 125 mg/dL (ref 0–200)
HDL: 42 mg/dL (ref >40)
LDL Cholesterol: 59 mg/dL (ref 0–99)
Triglycerides: 119 mg/dL (ref <150)
VLDL: 24 mg/dL (ref 0–40)

## 2015-06-14 LAB — POCT ACTIVATED CLOTTING TIME: ACTIVATED CLOTTING TIME: 361 s

## 2015-06-14 MED ORDER — ATORVASTATIN CALCIUM 10 MG PO TABS: 10.0000 mg | ORAL_TABLET | Freq: Every day | ORAL | Status: DC

## 2015-06-14 MED ORDER — TICAGRELOR 90 MG PO TABS: 90.0000 mg | ORAL_TABLET | Freq: Two times a day (BID) | ORAL | Status: DC

## 2015-06-14 MED ORDER — ASPIRIN 81 MG PO CHEW: 81.0000 mg | CHEWABLE_TABLET | Freq: Every day | ORAL | Status: DC

## 2015-06-14 MED ORDER — ACETAMINOPHEN 325 MG PO TABS: 650.0000 mg | ORAL_TABLET | ORAL | Status: AC | PRN

## 2015-06-14 MED ORDER — LISINOPRIL 2.5 MG PO TABS: 2.5000 mg | ORAL_TABLET | Freq: Every day | ORAL | Status: DC

## 2015-06-14 MED ORDER — NITROGLYCERIN 0.4 MG SL SUBL: 0.4000 mg | SUBLINGUAL_TABLET | SUBLINGUAL | Status: DC | PRN

## 2015-06-14 MED ORDER — CARVEDILOL 3.125 MG PO TABS: 3.1250 mg | ORAL_TABLET | Freq: Two times a day (BID) | ORAL | Status: DC

## 2015-06-14 MED FILL — Lidocaine HCl Local Preservative Free (PF) Inj 1%: INTRAMUSCULAR | Qty: 30 | Status: AC

## 2015-06-14 NOTE — Progress Notes (Signed)
UR Completed Ishmel Acevedo Graves-Bigelow, RN,BSN [phone]  

## 2015-06-14 NOTE — Telephone Encounter (Signed)
New message      TCM appt on 06-22-15 with Wilburt Finlay per Vernona Rieger

## 2015-06-14 NOTE — Discharge Instructions (Signed)
Call [facility] at [phone] if any bleeding, swelling or drainage at cath site.  May shower, no tub baths for 48 hours for groin sticks. No lifting over 5 pounds for 7 days.  No Driving for 7 days  Take 1 NTG, under your tongue, while sitting.  If no relief of pain may repeat NTG, one tab every 5 minutes up to 3 tablets total over 15 minutes.  If no relief CALL 911.  If you have dizziness/lightheadness  while taking NTG, stop taking and call 911.        No work until seen back in the office.  Heart Healthy Diet.  DO NOT stop Brilinta and asprin.   Stopping could cause a heart attack.     Bring meds with you to appointment.  Call if any questions or concerns.   Our office will arrange a chest x ray the day you come to the office.

## 2015-06-14 NOTE — Care Management Note (Addendum)
Case Management Note  Patient Details  Name: Devin Ellis MRN: [id number] Date of Birth: May 07, 1961  Subjective/Objective: Pt admitted for cp-Stemi- post cath.                    Action/Plan: No needs from CM at this time. Pt has Medicaid and medications will cost $3.00.   Gala Lewandowsky, RN 06/14/2015, 8:47 AM

## 2015-06-14 NOTE — Discharge Summary (Signed)
Physician Discharge Summary       Patient ID: Devin Ellis MRN: [id number] DOB/AGE: 1960-12-15 54 y.o.  Admit date: 06/12/2015 Discharge date: 06/14/2015 Primary Cardiologist:Dr. Gwenlyn Found   Discharge Diagnoses:  Principal Problem:   ST elevation (STEMI) myocardial infarction involving left anterior descending coronary artery Active Problems:   Hyperlipidemia LDL goal <70   Tobacco abuse   S/P angioplasty with stent, to LAD with Xience DES 06/12/15    Cardiomyopathy, ischemic, EF by Echo 40-45%   CAD in native artery, residual 60 LCX   Discharged Condition: good  Procedures: emergent cardiac cath 06/12/15 by Dr. Adora Fridge followed by  PCI with Xience DES to 100% LAD lesion.   Hospital Course:   Mr. Pingree is a 54 year old thin-appearing married Caucasian male without prior cardiac history. He does have a history of tobacco abuse. He developed substernal chest pain at approximately 2:00 this afternoon. He ultimately was brought to [facility] emergency room where an EKG showed anterolateral ST segment elevation V2 - V4 of 4 mm with 5 - 10 mm inferior ST depression-. Code STEMI was activated and he was taken to the cath lab urgently for intervention.  He has 60% LCX treating medically but 100% LAD undergoing PCI and Xience DES.  Pk troponin > 65.    Pt is on BB, ASA, Brilinta, statin and ACE.  His EF at cath 25-35% by echo 40-45%.  [name] on ACE.  Meds to be titrated up as outpt as BP allows.  Also on BB.  On statin, now at 10 mg- decreased due to low LDL.  [name] on ASA and brilinta and given 30 day free card.  On day of discharge he was seen and evaluated by Dr. Johnsie Cancel and found stable for discharge.  He walked with cardiac rehab as well. We recommended phase 2 cardiac rehab.   Continued medical therapy for his 60% LCX stenosis.  Also discussed importance of stopping tobacco.   He has short burst of NSVT on early AM of 06/13/15.  None noted since. On BB.  Will need outpt CXR.      Consults: cardiology  Significant Diagnostic Studies:  BMP Latest Ref Rng 06/13/2015 06/12/2015  Glucose 65 - 99 mg/dL 115(H) 161(H)  BUN 6 - 20 mg/dL 16 20  Creatinine 0.61 - 1.24 mg/dL 0.61 0.82  Sodium 135 - 145 mmol/L 137 138  Potassium 3.5 - 5.1 mmol/L 4.0 3.5  Chloride 101 - 111 mmol/L 106 102  CO2 22 - 32 mmol/L 23 23  Calcium 8.9 - 10.3 mg/dL 8.8(L) 9.2   CBC Latest Ref Rng 06/13/2015 06/12/2015  WBC 4.0 - 10.5 K/uL 14.1(H) 19.6(H)  Hemoglobin 13.0 - 17.0 g/dL 14.9 16.5  Hematocrit 39.0 - 52.0 % 43.4 47.4  Platelets 150 - 400 K/uL 212 255   Tropnin pk > 65 with decline to 26.9 by discharge  Lipid Panel     Component Value Date/Time   CHOL 125 06/14/2015 0406   TRIG 119 06/14/2015 0406   HDL 42 06/14/2015 0406   CHOLHDL 3.0 06/14/2015 0406   VLDL 24 06/14/2015 0406   LDLCALC 59 06/14/2015 0406   EKG prior to discharge: Normal sinus rhythm Septal infarct , age undetermined Lateral infarct , age undetermined Abnormal ECG   ECHO: Study Conclusions  - Left ventricle: The cavity size was normal. Wall thickness was normal. Systolic function was mildly to moderately reduced. The estimated ejection fraction was in the range of 40% to 45%. There is  severe hypokinesis of the mid-apicalanteroseptal, anterior, anterolateral, inferior, and apical myocardium. Doppler parameters are consistent with abnormal left ventricular relaxation (grade 1 diastolic dysfunction). - Mitral valve: There was trivial regurgitation. - Right atrium: Central venous pressure (est): 8 mm Hg. - Tricuspid valve: There was physiologic regurgitation. - Pulmonary arteries: Systolic pressure could not be accurately estimated. - Pericardium, extracardiac: A small pericardial effusion was identified anterior to the heart.  Impressions:  - Normal LV wall thickness with LVEF 40-45%, severe hypokinesis of the mid to apical anterolateral, inferoapical, and  periapical myocardium. No obvious LV mural thormbus noted. Grade 1 diastolic dysfunction. Trivial mitral regurgitation. Small anterior pericardial effusion. Echodensity noted base of right atrium likely trabeculation of wall versus Eustachian valve.  CARDIAC CATH:  Prox Cx to Mid Cx lesion, 60% stenosed.  Mid LAD to Dist LAD lesion, 100% stenosed. There is a 0% residual stenosis post intervention. There is moderate to severe left ventricular systolic dysfunction Pt had an acute anterior wall myocardial infarction treated with PCI and stent using a drug-eluting stent. The procedure was performed temporally. He received weight based bivalirudin and dual and Therapy including Brilenta. Radial cocktail was administered. A total of 135 mL of contrast was administered to the patient. The ACT was measured at 361. Lesion was crossed with a prolonged or guidewire, dilated with a 2 mm balloon and stented with a 2.5 x 15 mm long Xience drug-eluting stent. The stent was postdilated with a 2.75 x 12 mm balloon at 14-16 atm (2.8 mm) resulting in reduction of a total occlusion to 0% residual with TIMI-3 flow. Ejection fraction was measured at approximately 35% with anteroapical akinesia. The door to balloon time was measured at 28 minutes.    Discharge Exam: Blood pressure 111/68, pulse 116, temperature 99.2 F (37.3 C), temperature source Oral, resp. rate 16, height 6' (1.829 m), weight 169 lb 3.2 oz (76.749 kg), SpO2 97 %.  Disposition: HOME      Discharge Instructions    Amb Referral to Cardiac Rehabilitation    Complete by:  As directed   Congestive Heart Failure: If diagnosis is Heart Failure, patient MUST meet each of the CMS criteria: 1. Left Ventricular Ejection Fraction </= 35% 2. NYHA class II-IV symptoms despite being on optimal heart failure therapy for at least 6 weeks. 3. Stable = have not had a recent (<6 weeks) or planned (<6 months) major cardiovascular hospitalization or  procedure  Program Details: - Physician supervised classes - 1-3 classes per week over a 12-18 week period, generally for a total of 36 sessions  Physician Certification: I certify that the above Cardiac Rehabilitation treatment is medically necessary and is medically approved by me for treatment of this patient. The patient is willing and cooperative, able to ambulate and medically stable to participate in exercise rehabilitation. The participant's progress and Individualized Treatment Plan will be reviewed by the Medical Director, Cardiac Rehab staff and as indicated by the Referring/Ordering Physician.  Diagnosis:   Myocardial Infarction PCI              Medication List    STOP taking these medications        meloxicam 15 MG tablet  Commonly known as:  MOBIC     naproxen sodium 220 MG tablet  Commonly known as:  ANAPROX      TAKE these medications        acetaminophen 325 MG tablet  Commonly known as:  TYLENOL  Take 2 tablets (650 mg total) by  mouth every 4 (four) hours as needed for headache or mild pain.     aspirin 81 MG chewable tablet  Chew 1 tablet (81 mg total) by mouth daily.     atorvastatin 10 MG tablet  Commonly known as:  LIPITOR  Take 1 tablet (10 mg total) by mouth daily.     carvedilol 3.125 MG tablet  Commonly known as:  COREG  Take 1 tablet (3.125 mg total) by mouth 2 (two) times daily with a meal.     lisinopril 2.5 MG tablet  Commonly known as:  PRINIVIL,ZESTRIL  Take 1 tablet (2.5 mg total) by mouth daily.     nitroGLYCERIN 0.4 MG SL tablet  Commonly known as:  NITROSTAT  Place 1 tablet (0.4 mg total) under the tongue every 5 (five) minutes as needed for chest pain.     predniSONE 10 MG tablet  Commonly known as:  DELTASONE  Take 5 mg by mouth every other day.     ticagrelor 90 MG Tabs tablet  Commonly known as:  BRILINTA  Take 1 tablet (90 mg total) by mouth 2 (two) times daily.       Follow-up Information    Follow up with HAGER,  BRYAN, PA-C On 06/22/2015.   Specialties:  Physician Assistant, Radiology, Interventional Cardiology   Why:  at 8:30 am- first visit   Contact information:   [address] [place] [address] [phone]       Follow up with Quay Burow, MD On 08/03/2015.   Specialties:  Cardiology, Radiology   Why:  at 9:30 AM   Contact information:   [address] [place] [address] [phone]        Discharge Instructions: Call [facility] at [phone] if any bleeding, swelling or drainage at cath site.  May shower, no tub baths for 48 hours for groin sticks. No lifting over 5 pounds for 7 days.  No Driving for 7 days  Take 1 NTG, under your tongue, while sitting.  If no relief of pain may repeat NTG, one tab every 5 minutes up to 3 tablets total over 15 minutes.  If no relief CALL 911.  If you have dizziness/lightheadness  while taking NTG, stop taking and call 911.        No work until seen back in the office.  Heart Healthy Diet.  DO NOT stop Brilinta and asprin.   Stopping could cause a heart attack.     Bring meds with you to appointment.  Call if any questions or concerns.  Our office will arrange a chest x ray the day you come to the office  Signed: [id number] R Nurse Practitioner-Certified [facility]: [facility] 06/14/2015, 10:16 AM  Time spent on discharge : > 30  minutes.

## 2015-06-14 NOTE — Progress Notes (Signed)
Patient ID: Devin Ellis, male   DOB: May 22, 1961, 54 y.o.   MRN: [id number]    SUBJECTIVE:  No complaints ready to go home    PHYSICAL EXAM Filed Vitals:   06/13/15 1200 06/13/15 1519 06/13/15 2100 06/14/15 0420  BP: 129/82 130/83 115/79 108/69  Pulse:  106 100 106  Temp: 97.4 F (36.3 C) 98.1 F (36.7 C) 97.7 F (36.5 C) 99.2 F (37.3 C)  TempSrc: Oral Oral Oral Oral  Resp: 18 16 18 16   Height:      Weight:    76.749 kg (169 lb 3.2 oz)  SpO2: 98% 98% 100% 97%   General:  No distress Lungs:  Clear Heart:  RRR Abdomen:  Positive bowel sounds, no rebound no guarding Extremities:  No edema, right wrist without bruising. Right radial cath sight A with good pulse   LABS: Lab Results  Component Value Date   TROPONINI 26.93* 06/13/2015   Results for orders placed or performed during the hospital encounter of 06/12/15 (from the past 24 hour(s))  Basic metabolic panel     Status: Abnormal   Collection Time: 06/13/15  8:25 AM  Result Value Ref Range   Sodium 137 135 - 145 mmol/L   Potassium 4.0 3.5 - 5.1 mmol/L   Chloride 106 101 - 111 mmol/L   CO2 23 22 - 32 mmol/L   Glucose, Bld 115 (H) 65 - 99 mg/dL   BUN 16 6 - 20 mg/dL   Creatinine, Ser 06/15/15 0.61 - 1.24 mg/dL   Calcium 8.8 (L) 8.9 - 10.3 mg/dL   GFR calc non Af Amer >60 >60 mL/min   GFR calc Af Amer >60 >60 mL/min   Anion gap 8 5 - 15  CBC     Status: Abnormal   Collection Time: 06/13/15  8:25 AM  Result Value Ref Range   WBC 14.1 (H) 4.0 - 10.5 K/uL   RBC 4.35 4.22 - 5.81 MIL/uL   Hemoglobin 14.9 13.0 - 17.0 g/dL   HCT 06/15/15 93.2 - 35.5 %   MCV 99.8 78.0 - 100.0 fL   MCH 34.3 (H) 26.0 - 34.0 pg   MCHC 34.3 30.0 - 36.0 g/dL   RDW 73.2 20.2 - 54.2 %   Platelets 212 150 - 400 K/uL  Troponin I (q 6hr x 3)     Status: Abnormal   Collection Time: 06/13/15  8:25 AM  Result Value Ref Range   Troponin I 26.93 (HH) <0.031 ng/mL  Lipid panel     Status: None   Collection Time: 06/14/15  4:06 AM  Result Value Ref  Range   Cholesterol 125 0 - 200 mg/dL   Triglycerides 06/16/15 237 mg/dL   HDL 42 <628 mg/dL   Total CHOL/HDL Ratio 3.0 RATIO   VLDL 24 0 - 40 mg/dL   LDL Cholesterol 59 0 - 99 mg/dL     Current facility-administered medications:  .  0.9 %  sodium chloride infusion, 250 mL, Intravenous, PRN, >31, MD .  acetaminophen (TYLENOL) tablet 650 mg, 650 mg, Oral, Q4H PRN, Runell Gess, MD .  aspirin chewable tablet 81 mg, 81 mg, Oral, Daily, Runell Gess, MD, 81 mg at 06/13/15 0919 .  atorvastatin (LIPITOR) tablet 80 mg, 80 mg, Oral, q1800, 06/15/15, MD, 80 mg at 06/13/15 1749 .  carvedilol (COREG) tablet 3.125 mg, 3.125 mg, Oral, BID WC, 06/15/15, MD, 3.125 mg at 06/13/15 1749 .  lisinopril (PRINIVIL,ZESTRIL) tablet 2.5 mg, 2.5  mg, Oral, Daily, Rollene Rotunda, MD, 2.5 mg at 06/13/15 0951 .  morphine 2 MG/ML injection 2 mg, 2 mg, Intravenous, Q1H PRN, Runell Gess, MD .  ondansetron [facility]) injection 4 mg, 4 mg, Intravenous, Q6H PRN, Runell Gess, MD .  sodium chloride 0.9 % injection 3 mL, 3 mL, Intravenous, Q12H, Runell Gess, MD, 3 mL at 06/13/15 2145 .  sodium chloride 0.9 % injection 3 mL, 3 mL, Intravenous, PRN, Runell Gess, MD .  ticagrelor [facility]) tablet 90 mg, 90 mg, Oral, BID, Runell Gess, MD, 90 mg at 06/13/15 2144  Intake/Output Summary (Last 24 hours) at 06/14/15 0814 Last data filed at 06/13/15 2145  Gross per 24 hour  Intake    963 ml  Output      0 ml  Net    963 ml    EKG:    NSR, rate 84, axis WNL, resolution of ST elevation, lateral and septal Q waves.  06/14/2015  ASSESSMENT AND PLAN:  ACUTE ANTERIOR MI:  PCI/DES:  Resolution of ST changes and pain.      On ACE and beta blocker   ISCHEMIC CARDIOMYOPATHY:  Meds as above.  Discussed with the patient.   EF 40-45% by echo   TOBACCO ABUSE:  Seems willing to stop smoking  Discussed risk of antother MI with 60% residual circumflex disease   RISK REDUCTION:  LDL 56   Will d/c on low dose lipitor instead of 80 mg  Lipitor 10 mg daily   Outpatient f/u with Dr Nydia Bouton [facility] 06/14/2015 8:14 AM

## 2015-06-14 NOTE — Progress Notes (Addendum)
CARDIAC REHAB PHASE I   PRE:  Rate/Rhythm: 108 ST  BP:  Sitting: 113/67        SaO2: 96 RA  MODE:  Ambulation: 550 ft   POST:  Rate/Rhythm: 124 sT  BP:  Sitting: 115/77         SaO2: 97 RA  Pt ambulated 550 ft on RA, independent, steady gait, tolerated well.  Pt denies cp, dizziness, DOE, declined rest stop. Pt HR elevated after walk, denies any symptoms. Completed MI/stent education.  Reviewed risk factors, tobacco cessation, anti-platelet therapy, stent card, activity restrictions, ntg, exercise, heart healthy diet, portion control, sodium restrictions and phase 2 cardiac rehab. Pt verbalized understanding. Pt agrees to phase 2 cardiac rehab. Will send referral to [facility]. Pt very receptive to education, states he has been under a lot of stress recently with his job, moving and his wife's daughter moving in, but he is ready to quit smoking and ready to make changes in his diet. Emotional support given to pt. Pt to chair after walk, call bell within reach.  [phone]   Joylene Grapes, RN, BSN 06/14/2015 9:22 AM

## 2015-06-15 NOTE — Telephone Encounter (Signed)
DC 8/15

## 2015-06-16 ENCOUNTER — Other Ambulatory Visit (INDEPENDENT_AMBULATORY_CARE_PROVIDER_SITE_OTHER): Payer: Medicaid Other | Admitting: *Deleted

## 2015-06-16 DIAGNOSIS — R82998 Other abnormal findings in urine: Secondary | ICD-10-CM

## 2015-06-16 DIAGNOSIS — R8299 Other abnormal findings in urine: Secondary | ICD-10-CM | POA: Diagnosis not present

## 2015-06-16 LAB — URINALYSIS
Bilirubin Urine: NEGATIVE
Hgb urine dipstick: NEGATIVE
Ketones, ur: NEGATIVE
LEUKOCYTES UA: NEGATIVE
NITRITE: NEGATIVE
PH: 6 (ref 5.0–8.0)
SPECIFIC GRAVITY, URINE: 1.025 (ref 1.000–1.030)
Urine Glucose: NEGATIVE
Urobilinogen, UA: 4 — AB (ref 0.0–1.0)

## 2015-06-16 NOTE — Addendum Note (Signed)
Addended by: Tonita Phoenix on: 06/16/2015 12:52 PM   Modules accepted: Orders

## 2015-06-16 NOTE — Addendum Note (Signed)
Addended by: Martine Bleecker K on: 06/16/2015 12:55 PM   Modules accepted: Orders  

## 2015-06-16 NOTE — Telephone Encounter (Signed)
New message     Pt has blood in urine Please call to discuss

## 2015-06-16 NOTE — Telephone Encounter (Signed)
Pt released from hospital 8/15 following STEMI w/ PCI intervention, Put on brilinta as well as other new meds - no history prior.  He notes darkening of urine last night and this morning, was informed to watch for this as a possible SE.  Notes no other symptoms, denies weakness, fatigue, dizziness.  Instructed if new/concerning symptoms to seek emergency evaluation- I would forward to [employer] to address current concerns, see what is advised. Pt voiced understanding.  Routed to Dr. [place] to address.

## 2015-06-16 NOTE — Addendum Note (Signed)
Addended by: Tonita Phoenix on: 06/16/2015 12:55 PM   Modules accepted: Orders

## 2015-06-16 NOTE — Addendum Note (Signed)
Addended by: Ladell Heads on: 06/16/2015 12:10 PM   Modules accepted: Orders

## 2015-06-16 NOTE — Addendum Note (Signed)
Addended by: BOWDEN, ROBIN K on: 06/16/2015 12:52 PM   Modules accepted: Orders  

## 2015-06-16 NOTE — Addendum Note (Signed)
Addended by: Ladell Heads on: 06/16/2015 11:43 AM   Modules accepted: Orders

## 2015-06-16 NOTE — Telephone Encounter (Signed)
Notification from [facility] - UA resulted - please advise.

## 2015-06-16 NOTE — Telephone Encounter (Signed)
Called pt, preference for lab location discussed, pt will get done at [facility]. UA ordered.

## 2015-06-16 NOTE — Telephone Encounter (Signed)
I would check a Urinalysis on him.  Eufelia Veno [place] MD, [facility]

## 2015-06-17 NOTE — Telephone Encounter (Signed)
Per Dr. Elvis Coil note on UA results - results OK, reassure patient.  Called patient, mobile went to VM - no message box set up. Home line also attempted, this is not valid number.

## 2015-06-17 NOTE — Telephone Encounter (Signed)
Spoke to patient. Advised UA results from yesterday OK. Asked how he was doing, if any problems. He reports no issues, his problem of dark urine resolved on its own.  Advised to continue Brilinta, other meds at recommended doses, and to call if further problems. Patient voiced understanding.

## 2015-06-18 LAB — URINE CULTURE
Colony Count: NO GROWTH
ORGANISM ID, BACTERIA: NO GROWTH

## 2015-06-19 ENCOUNTER — Telehealth: Payer: Self-pay | Admitting: Physician Assistant

## 2015-06-19 NOTE — Telephone Encounter (Signed)
Patient's wife called the after hr cardiology service as patient has been having N/V since yesterday morning.   She states he vomited both yesterday morning and this morning. He recently discharged after anterolateral MI. He denies any chest pain when asked by his wife. I reviewed his medication list, only medication which can cause N/V is prednisone which he is no longer taking. And the fact that his nausea/vomiting started 3 days after discharge make medication related side effect less likely.   I have advised her to keep him hydrated and if symptom persists or he develops any fever, he will need to be seen by Urgent Care. Note, he recently had urinalysis which was normal without any sign of UTI. His EF by cath was 25-35%, however by echo on 06/13/2015 was 40-45%. S/p stent to mid to distal LAD, still has 60% prox to mid LCx lesion, however again, he currently has no chest pain.   Ramond Dial PA Pager: [phone]

## 2015-06-22 ENCOUNTER — Ambulatory Visit (INDEPENDENT_AMBULATORY_CARE_PROVIDER_SITE_OTHER): Payer: Medicaid Other | Admitting: Physician Assistant

## 2015-06-22 ENCOUNTER — Telehealth: Payer: Self-pay

## 2015-06-22 ENCOUNTER — Encounter: Payer: Self-pay | Admitting: Physician Assistant

## 2015-06-22 VITALS — BP 102/68 | HR 101 | Ht 73.0 in | Wt 189.0 lb

## 2015-06-22 DIAGNOSIS — I2510 Atherosclerotic heart disease of native coronary artery without angina pectoris: Secondary | ICD-10-CM | POA: Diagnosis not present

## 2015-06-22 DIAGNOSIS — I5041 Acute combined systolic (congestive) and diastolic (congestive) heart failure: Secondary | ICD-10-CM | POA: Diagnosis not present

## 2015-06-22 DIAGNOSIS — Z9889 Other specified postprocedural states: Secondary | ICD-10-CM | POA: Diagnosis not present

## 2015-06-22 DIAGNOSIS — I5031 Acute diastolic (congestive) heart failure: Secondary | ICD-10-CM | POA: Diagnosis not present

## 2015-06-22 DIAGNOSIS — I255 Ischemic cardiomyopathy: Secondary | ICD-10-CM

## 2015-06-22 DIAGNOSIS — Z95820 Peripheral vascular angioplasty status with implants and grafts: Secondary | ICD-10-CM

## 2015-06-22 DIAGNOSIS — Z720 Tobacco use: Secondary | ICD-10-CM

## 2015-06-22 LAB — BRAIN NATRIURETIC PEPTIDE: PRO B NATRI PEPTIDE: 86 pg/mL (ref 0.0–100.0)

## 2015-06-22 MED ORDER — FUROSEMIDE 40 MG PO TABS: 40.0000 mg | ORAL_TABLET | Freq: Every day | ORAL | Status: DC

## 2015-06-22 MED ORDER — POTASSIUM CHLORIDE CRYS ER 20 MEQ PO TBCR: 20.0000 meq | EXTENDED_RELEASE_TABLET | Freq: Every day | ORAL | Status: DC

## 2015-06-22 MED ORDER — FUROSEMIDE 40 MG PO TABS: ORAL_TABLET | ORAL | Status: DC

## 2015-06-22 MED ORDER — POTASSIUM CHLORIDE ER 10 MEQ PO TBCR: EXTENDED_RELEASE_TABLET | ORAL | Status: DC

## 2015-06-22 NOTE — Assessment & Plan Note (Signed)
No complaints of angina. He has taken aspirin and Brilinta as directed. Is also on a low dose of Coreg which cannot be titrated at this time

## 2015-06-22 NOTE — Assessment & Plan Note (Signed)
[name] on 2.5 mg of lisinopril. No room for titration at this time.

## 2015-06-22 NOTE — Assessment & Plan Note (Signed)
Patient is clearly in acute heart failure. Weight is up 20 pounds. His neck veins are distended up to his ears when he sitting straight up. He has 1+ lower extremity edema and abdominal swelling. We'll start him on Lasix 40 mg daily and 10 MG Q potassium. We'll check a BNP and chest x-ray today. Check a basic metabolic panel next week at his next office visit.  We discussed low sodium diet daily weight management.  His weight gets down to about 172 pounds he is to stop taking the Lasix and potassium and use it as needed 3 pounds in 24 hours or 5 pounds in a week. He starts getting dizzy or lightheaded he's to stop taking the Lasix.

## 2015-06-22 NOTE — Telephone Encounter (Signed)
Faxed orders to cardiac rehab  

## 2015-06-22 NOTE — Patient Instructions (Addendum)
Medication Instructions:   START TAKING LASIX 40 MG ONCE A DAY    START TAKING POTASSIUM 10 MEQ ONCE A DAY  EVERY TIME YOU TAKE A LASIX    ONCE YOUR WEIGHT GETS DOWN TO 170 TO 172 LBS STOP TAKING LASIX AND POTASSIUM    AND ONLY TAKE AS NEEDED IF YOU GAIN 3LBS IN 24 HOURS OR UP TO 5LBS IN A WEEK    Labwork:  BNP TODAY  BMET NEXT WEEK  ON THE SAME DAY AS FOLLOW UP APPT  06/29/15   Testing/Procedures:  A chest x-ray takes a picture of the organs and structures inside the chest, including the heart, lungs, and blood vessels. [facility] [address] This test can show several things, including, whether the heart is enlarges; whether fluid is building up in the lungs; and whether pacemaker / defibrillator leads are still in place.    Follow-Up:   IN ONE WEEK WITH AN  APP ON FLEX SCHEDULE 06/29/15   Any Other Special Instructions Will Be Listed Below (If Applicable).

## 2015-06-22 NOTE — Progress Notes (Signed)
Patient ID: Devin Ellis, male   DOB: 07-27-1961, 54 y.o.   MRN: [id number]    Date:  06/22/2015   ID:  Devin Ellis, DOB Sep 20, 1961, MRN [id number]  PCP:  Horald Pollen., PA-C  Primary Cardiologist:  Cheron Schaumann chief complaint on file.    History of Present Illness: Devin Ellis is a 54 y.o. male  Devin Ellis is a 54 year old thin-appearing married Caucasian male without prior cardiac history. He does have a history of tobacco abuse. He developed substernal chest pain at approximately 2:00 this afternoon. He ultimately was brought to [facility] emergency room where an EKG showed anterolateral ST segment elevation V2 - V4 of 4 mm with 5 - 10 mm inferior ST depression-. Code STEMI was activated and he was taken to the cath lab urgently for intervention. He has 60% LCX treating medically but 100% LAD undergoing PCI and Xience DES. Pk troponin > 65.   Pt is on BB, ASA, Brilinta, statin and ACE. His EF at cath 25-35% by echo 40-45%. [name] on ACE.  Also on BB. On statin, now at 10 mg- decreased due to low LDL. We recommended phase 2 cardiac rehab. Continued medical therapy for his 60% LCX stenosis. Also discussed importance of stopping tobacco. He has short burst of NSVT on early AM of 06/13/15. None noted since.  Patient is for posthospital follow-up. [name] reporting shortness of breath with activity and when laying down. He says when he sits up he breathes better. He denies any lower extremity edema. He does not weigh himself on a daily basis but on our scale is 20 pounds more than he was on the 15th of the month.  He also reports some nausea particularly after taking the evening dose of Brilinta.  He denies  vomiting, fever, chest pain,  dizziness, cough, congestion, abdominal pain, hematochezia, melena, lower extremity edema, claudication.  Wt Readings from Last 3 Encounters:  06/22/15 189 lb (85.73 kg)  06/14/15 169 lb 3.2 oz (76.749 kg)     Past Medical History    Diagnosis Date  . Tobacco abuse   . Hyperlipidemia LDL goal <70 06/14/2015  . S/P angioplasty with stent, to LAD with Xience DES 06/12/15  06/14/2015  . ST elevation (STEMI) myocardial infarction involving left anterior descending coronary artery 06/12/2015  . Cardiomyopathy, ischemic, EF by Echo 40-45% 06/14/2015  . CAD in native artery, residual 60 LCX 06/14/2015    Current Outpatient Prescriptions  Medication Sig Dispense Refill  . acetaminophen (TYLENOL) 325 MG tablet Take 2 tablets (650 mg total) by mouth every 4 (four) hours as needed for headache or mild pain.    [name] aspirin 81 MG chewable tablet Chew 1 tablet (81 mg total) by mouth daily.    [name] atorvastatin (LIPITOR) 10 MG tablet Take 1 tablet (10 mg total) by mouth daily. 30 tablet 6  . carvedilol (COREG) 3.125 MG tablet Take 1 tablet (3.125 mg total) by mouth 2 (two) times daily with a meal. 60 tablet 6  . lisinopril (PRINIVIL,ZESTRIL) 2.5 MG tablet Take 1 tablet (2.5 mg total) by mouth daily. 30 tablet 6  . nitroGLYCERIN (NITROSTAT) 0.4 MG SL tablet Place 1 tablet (0.4 mg total) under the tongue every 5 (five) minutes as needed for chest pain. 25 tablet 4  . ticagrelor (BRILINTA) 90 MG TABS tablet Take 1 tablet (90 mg total) by mouth 2 (two) times daily. 60 tablet 11  . furosemide (LASIX) 40 MG tablet Take 1 tablet (40  mg total) by mouth daily. 30 tablet 6  . potassium chloride SA (KLOR-CON M20) 20 MEQ tablet Take 1 tablet (20 mEq total) by mouth daily. 30 tablet 6   No current facility-administered medications for this visit.    Allergies:   No Known Allergies  Social History:  The patient  reports that he has been smoking Cigarettes.  He has a 45 pack-year smoking history. He does not have any smokeless tobacco history on file.   Family history:  No family history on file.  ROS:  Please see the history of present illness.  All other systems reviewed and negative.   PHYSICAL EXAM: VS:  BP 102/68 mmHg  Pulse 101  Ht 6\' 1"   (1.854 m)  Wt 189 lb (85.73 kg)  BMI 24.94 kg/m2 Well nourished, well developed, in no acute distress HEENT: Pupils are equal round react to light accommodation extraocular movements are intact.  Neck:+JVD up to his earsNo cervical lymphadenopathy. Cardiac: Regular rate and rhythm without murmurs rubs or gallops. Lungs:  Decreased breath sounds on the right.  No wheeze or rales Abd: soft, nontender, positive bowel sounds all quadrants, positive distention  Ext: 1+ lower extremity edema.  2+ radial and dorsalis pedis pulses. Skin: warm and dry Neuro:  Grossly normal  EKG: Sinus tachycardia rate 101 bpm lateral and septal Q waves    ASSESSMENT AND PLAN:  Problem List Items Addressed This Visit    Tobacco abuse (Chronic)   S/P angioplasty with stent, to LAD with Xience DES 06/12/15     No complaints of angina. He has taken aspirin and Brilinta as directed. Is also on a low dose of Coreg which cannot be titrated at this time      Cardiomyopathy, ischemic, EF by Echo 40-45%    [name] on 2.5 mg of lisinopril. No room for titration at this time.      Relevant Medications   furosemide (LASIX) 40 MG tablet   CAD in native artery, residual 60 LCX (Chronic)   Relevant Medications   furosemide (LASIX) 40 MG tablet   Acute combined systolic and diastolic heart failure    Patient is clearly in acute heart failure. Weight is up 20 pounds. His neck veins are distended up to his ears when he sitting straight up. He has 1+ lower extremity edema and abdominal swelling. We'll start him on Lasix 40 mg daily and 10 MG Q potassium. We'll check a BNP and chest x-ray today. Check a basic metabolic panel next week at his next office visit.  We discussed low sodium diet daily weight management.  His weight gets down to about 172 pounds he is to stop taking the Lasix and potassium and use it as needed 3 pounds in 24 hours or 5 pounds in a week. He starts getting dizzy or lightheaded he's to stop taking the  Lasix.      Relevant Medications   furosemide (LASIX) 40 MG tablet    Other Visit Diagnoses    Acute diastolic heart failure    -  Primary    Relevant Medications    furosemide (LASIX) 40 MG tablet    Other Relevant Orders    EKG 12-Lead    B Nat Peptide    DG Chest 2 View    Basic Metabolic Panel (BMET)

## 2015-06-24 ENCOUNTER — Telehealth: Payer: Self-pay | Admitting: Cardiovascular Disease

## 2015-06-24 NOTE — Telephone Encounter (Signed)
New message      Pt c/o swelling: STAT is pt has developed SOB within 24 hours  1. How long have you been experiencing swelling? Since his last ov on tuesday 2. Where is the swelling located? Ankles/legs 3.  Are you currently taking a "fluid pill"? Lasix 40mg  4.  Are you currently SOB? no  5.  Have you traveled recently? No Wife states pt has gained 11 lbs since last tuesday

## 2015-06-24 NOTE — Telephone Encounter (Signed)
Office VS 8/23 Tuesday weight = 189 Today home weight = 201  Started on lasix 40 mg daily and potassium 8/23 Tuesday Wife sates a lot of edema legs and ankles  No shortness of breath.  Sleeping poorly  Advised wife that she should take him to the ED for evaluation and to have management of excessive fluid that has become worse since his OV on Tuesday 8/23.  BNP was done 8/23 results = 86 (ref. 0-100) Chest xray ordered for 8/23.  Not done.  Patient has scheduled for 8/26

## 2015-06-25 ENCOUNTER — Emergency Department (HOSPITAL_COMMUNITY): Payer: Medicaid Other

## 2015-06-25 ENCOUNTER — Encounter (HOSPITAL_COMMUNITY): Payer: Self-pay | Admitting: *Deleted

## 2015-06-25 ENCOUNTER — Inpatient Hospital Stay (HOSPITAL_COMMUNITY)
Admission: EM | Admit: 2015-06-25 | Discharge: 2015-06-29 | DRG: 270 | Disposition: A | Discharge status: Home / Self Care | Payer: Medicaid Other | Attending: Thoracic Surgery (Cardiothoracic Vascular Surgery) | Admitting: Thoracic Surgery (Cardiothoracic Vascular Surgery)

## 2015-06-25 ENCOUNTER — Ambulatory Visit
Admission: RE | Admit: 2015-06-25 | Discharge: 2015-06-25 | Disposition: A | Discharge status: Home / Self Care | Payer: Medicaid Other | Source: Ambulatory Visit | Attending: Physician Assistant | Admitting: Physician Assistant

## 2015-06-25 DIAGNOSIS — I5031 Acute diastolic (congestive) heart failure: Secondary | ICD-10-CM

## 2015-06-25 DIAGNOSIS — I509 Heart failure, unspecified: Secondary | ICD-10-CM

## 2015-06-25 DIAGNOSIS — I2510 Atherosclerotic heart disease of native coronary artery without angina pectoris: Secondary | ICD-10-CM | POA: Diagnosis present

## 2015-06-25 DIAGNOSIS — I241 Dressler's syndrome: Secondary | ICD-10-CM | POA: Diagnosis present

## 2015-06-25 DIAGNOSIS — I313 Pericardial effusion (noninflammatory): Secondary | ICD-10-CM | POA: Diagnosis present

## 2015-06-25 DIAGNOSIS — Z9689 Presence of other specified functional implants: Secondary | ICD-10-CM

## 2015-06-25 DIAGNOSIS — R188 Other ascites: Secondary | ICD-10-CM | POA: Diagnosis present

## 2015-06-25 DIAGNOSIS — I314 Cardiac tamponade: Secondary | ICD-10-CM | POA: Diagnosis present

## 2015-06-25 DIAGNOSIS — I252 Old myocardial infarction: Secondary | ICD-10-CM

## 2015-06-25 DIAGNOSIS — J939 Pneumothorax, unspecified: Secondary | ICD-10-CM

## 2015-06-25 DIAGNOSIS — E785 Hyperlipidemia, unspecified: Secondary | ICD-10-CM | POA: Diagnosis present

## 2015-06-25 DIAGNOSIS — I255 Ischemic cardiomyopathy: Secondary | ICD-10-CM | POA: Diagnosis present

## 2015-06-25 DIAGNOSIS — E876 Hypokalemia: Secondary | ICD-10-CM | POA: Diagnosis not present

## 2015-06-25 DIAGNOSIS — D62 Acute posthemorrhagic anemia: Secondary | ICD-10-CM | POA: Diagnosis not present

## 2015-06-25 DIAGNOSIS — I2102 ST elevation (STEMI) myocardial infarction involving left anterior descending coronary artery: Secondary | ICD-10-CM | POA: Diagnosis not present

## 2015-06-25 DIAGNOSIS — I472 Ventricular tachycardia: Secondary | ICD-10-CM | POA: Diagnosis not present

## 2015-06-25 DIAGNOSIS — I5043 Acute on chronic combined systolic (congestive) and diastolic (congestive) heart failure: Secondary | ICD-10-CM | POA: Diagnosis not present

## 2015-06-25 DIAGNOSIS — Z955 Presence of coronary angioplasty implant and graft: Secondary | ICD-10-CM | POA: Diagnosis not present

## 2015-06-25 DIAGNOSIS — Z720 Tobacco use: Secondary | ICD-10-CM | POA: Diagnosis not present

## 2015-06-25 DIAGNOSIS — F17210 Nicotine dependence, cigarettes, uncomplicated: Secondary | ICD-10-CM | POA: Diagnosis present

## 2015-06-25 DIAGNOSIS — S1121XA Laceration without foreign body of pharynx and cervical esophagus, initial encounter: Secondary | ICD-10-CM | POA: Diagnosis not present

## 2015-06-25 DIAGNOSIS — Y838 Other surgical procedures as the cause of abnormal reaction of the patient, or of later complication, without mention of misadventure at the time of the procedure: Secondary | ICD-10-CM | POA: Diagnosis present

## 2015-06-25 DIAGNOSIS — R0602 Shortness of breath: Secondary | ICD-10-CM | POA: Diagnosis not present

## 2015-06-25 DIAGNOSIS — I10 Essential (primary) hypertension: Secondary | ICD-10-CM | POA: Diagnosis present

## 2015-06-25 DIAGNOSIS — Z95820 Peripheral vascular angioplasty status with implants and grafts: Secondary | ICD-10-CM

## 2015-06-25 DIAGNOSIS — Z9889 Other specified postprocedural states: Secondary | ICD-10-CM | POA: Diagnosis not present

## 2015-06-25 DIAGNOSIS — I3139 Other pericardial effusion (noninflammatory): Secondary | ICD-10-CM | POA: Diagnosis present

## 2015-06-25 DIAGNOSIS — I340 Nonrheumatic mitral (valve) insufficiency: Secondary | ICD-10-CM | POA: Diagnosis present

## 2015-06-25 DIAGNOSIS — R7989 Other specified abnormal findings of blood chemistry: Secondary | ICD-10-CM

## 2015-06-25 DIAGNOSIS — I5042 Chronic combined systolic (congestive) and diastolic (congestive) heart failure: Secondary | ICD-10-CM | POA: Diagnosis present

## 2015-06-25 HISTORY — DX: Cardiac tamponade: I31.4

## 2015-06-25 HISTORY — DX: Pericardial effusion (noninflammatory): I31.3

## 2015-06-25 LAB — CBC
HCT: 33.9 % — ABNORMAL LOW (ref 39.0–52.0)
HEMOGLOBIN: 11.6 g/dL — AB (ref 13.0–17.0)
MCH: 34.3 pg — AB (ref 26.0–34.0)
MCHC: 34.2 g/dL (ref 30.0–36.0)
MCV: 100.3 fL — AB (ref 78.0–100.0)
PLATELETS: 284 10*3/uL (ref 150–400)
RBC: 3.38 MIL/uL — AB (ref 4.22–5.81)
RDW: 13.7 % (ref 11.5–15.5)
WBC: 12.6 10*3/uL — AB (ref 4.0–10.5)

## 2015-06-25 LAB — I-STAT TROPONIN, ED: TROPONIN I, POC: 0.05 ng/mL (ref 0.00–0.08)

## 2015-06-25 LAB — BASIC METABOLIC PANEL
ANION GAP: 8 (ref 5–15)
BUN: 12 mg/dL (ref 6–20)
CHLORIDE: 95 mmol/L — AB (ref 101–111)
CO2: 27 mmol/L (ref 22–32)
Calcium: 8.4 mg/dL — ABNORMAL LOW (ref 8.9–10.3)
Creatinine, Ser: 0.74 mg/dL (ref 0.61–1.24)
GFR calc non Af Amer: >60 mL/min (ref >60)
Glucose, Bld: 144 mg/dL — ABNORMAL HIGH (ref 65–99)
Potassium: 4 mmol/L (ref 3.5–5.1)
SODIUM: 130 mmol/L — AB (ref 135–145)

## 2015-06-25 LAB — BRAIN NATRIURETIC PEPTIDE: B NATRIURETIC PEPTIDE 5: 141.5 pg/mL — AB (ref 0.0–100.0)

## 2015-06-25 LAB — D-DIMER, QUANTITATIVE (NOT AT ARMC): D DIMER QUANT: 3.09 ug{FEU}/mL — AB (ref 0.00–0.48)

## 2015-06-25 MED ORDER — FUROSEMIDE 10 MG/ML IJ SOLN
40.0000 mg | Freq: Once | INTRAMUSCULAR | Status: AC
  Administered 2015-06-25: 40 mg via INTRAVENOUS
  Filled 2015-06-25: qty 4

## 2015-06-25 MED ORDER — ONDANSETRON HCL 4 MG/2ML IJ SOLN: 4.0000 mg | Freq: Four times a day (QID) | INTRAMUSCULAR | Status: DC | PRN

## 2015-06-25 MED ORDER — HYDROCODONE-ACETAMINOPHEN 5-325 MG PO TABS: 1.0000 | ORAL_TABLET | Freq: Four times a day (QID) | ORAL | Status: DC | PRN

## 2015-06-25 MED ORDER — SODIUM CHLORIDE 0.9 % IJ SOLN: 3.0000 mL | INTRAMUSCULAR | Status: DC | PRN

## 2015-06-25 MED ORDER — ATORVASTATIN CALCIUM 10 MG PO TABS
10.0000 mg | ORAL_TABLET | Freq: Every day | ORAL | Status: DC
  Administered 2015-06-26 – 2015-06-29 (×4): 10 mg via ORAL
  Filled 2015-06-25 (×6): qty 1

## 2015-06-25 MED ORDER — TICAGRELOR 90 MG PO TABS
90.0000 mg | ORAL_TABLET | Freq: Two times a day (BID) | ORAL | Status: DC
  Administered 2015-06-25 – 2015-06-26 (×2): 90 mg via ORAL
  Filled 2015-06-25 (×2): qty 1

## 2015-06-25 MED ORDER — IOHEXOL 350 MG/ML SOLN
80.0000 mL | Freq: Once | INTRAVENOUS | Status: AC | PRN
  Administered 2015-06-25: 100 mL via INTRAVENOUS

## 2015-06-25 MED ORDER — SODIUM CHLORIDE 0.9 % IV SOLN: 250.0000 mL | INTRAVENOUS | Status: DC | PRN

## 2015-06-25 MED ORDER — HEPARIN SODIUM (PORCINE) 5000 UNIT/ML IJ SOLN
5000.0000 [IU] | Freq: Three times a day (TID) | INTRAMUSCULAR | Status: DC
  Administered 2015-06-25 – 2015-06-26 (×3): 5000 [IU] via SUBCUTANEOUS
  Filled 2015-06-25 (×4): qty 1

## 2015-06-25 MED ORDER — SODIUM CHLORIDE 0.9 % IJ SOLN
3.0000 mL | Freq: Two times a day (BID) | INTRAMUSCULAR | Status: DC
  Administered 2015-06-26: 3 mL via INTRAVENOUS

## 2015-06-25 MED ORDER — ASPIRIN 81 MG PO CHEW
81.0000 mg | CHEWABLE_TABLET | Freq: Every day | ORAL | Status: DC
  Administered 2015-06-25 – 2015-06-29 (×5): 81 mg via ORAL
  Filled 2015-06-25 (×5): qty 1

## 2015-06-25 MED ORDER — CARVEDILOL 3.125 MG PO TABS
3.1250 mg | ORAL_TABLET | Freq: Two times a day (BID) | ORAL | Status: DC
  Administered 2015-06-26 – 2015-06-29 (×6): 3.125 mg via ORAL
  Filled 2015-06-25 (×11): qty 1

## 2015-06-25 MED ORDER — LISINOPRIL 2.5 MG PO TABS
2.5000 mg | ORAL_TABLET | Freq: Every day | ORAL | Status: DC
  Administered 2015-06-26 – 2015-06-28 (×3): 2.5 mg via ORAL
  Filled 2015-06-25 (×5): qty 1

## 2015-06-25 MED ORDER — POTASSIUM CHLORIDE CRYS ER 20 MEQ PO TBCR
20.0000 meq | EXTENDED_RELEASE_TABLET | Freq: Once | ORAL | Status: AC
  Administered 2015-06-25: 20 meq via ORAL
  Filled 2015-06-25: qty 1

## 2015-06-25 MED ORDER — TORSEMIDE 20 MG PO TABS
20.0000 mg | ORAL_TABLET | Freq: Every day | ORAL | Status: DC
  Administered 2015-06-26: 20 mg via ORAL
  Filled 2015-06-25: qty 1

## 2015-06-25 MED ORDER — ONDANSETRON HCL 4 MG/2ML IJ SOLN: 4.0000 mg | Freq: Once | INTRAMUSCULAR | Status: DC

## 2015-06-25 MED ORDER — ACETAMINOPHEN 325 MG PO TABS: 650.0000 mg | ORAL_TABLET | ORAL | Status: DC | PRN

## 2015-06-25 NOTE — ED Notes (Signed)
Pt reports increased leg swelling and SOB. Pt reports this has been increasing even with Lasix use. Pt denies chest pain. Dr. Rubin Payor given EKG.

## 2015-06-25 NOTE — Telephone Encounter (Signed)
Pt c/o swelling: STAT is pt has developed SOB within 24 hours  1. How long have you been experiencing swelling? 8/23  2. Where is the swelling located? feet  3.  Are you currently taking a "fluid pill"? yes  4.  Are you currently SOB? yes  5.  Have you traveled recently?no

## 2015-06-25 NOTE — H&P (Signed)
[facility] History and Physical  [name] DJT:[id number] DOB: September 09, 1961 DOA: 06/25/2015  Referring physician: EDP PCP: Horald Pollen., PA-C   Chief Complaint: SOB, edema   HPI: Devin Ellis is a 54 y.o. male who had a STEMI 13 days ago.  Developed CHF over the past 1 week.  Seen by cards on the 23rd, weight up to 189 lbs from 169 lbs.  Put on lasix 40mg  daily.  Since then weight now increased to 193 lbs today.  Patients symptoms have continued to worsen.  He presents to the ED.  Review of Systems: Systems reviewed.  As above, otherwise negative  Past Medical History  Diagnosis Date  . Tobacco abuse   . Hyperlipidemia LDL goal <70 06/14/2015  . S/P angioplasty with stent, to LAD with Xience DES 06/12/15  06/14/2015  . ST elevation (STEMI) myocardial infarction involving left anterior descending coronary artery 06/12/2015  . Cardiomyopathy, ischemic, EF by Echo 40-45% 06/14/2015  . CAD in native artery, residual 60 LCX 06/14/2015   Past Surgical History  Procedure Laterality Date  . Shoulder surgery Right     Torn bicep  . Cardiac catheterization N/A 06/12/2015    Procedure: Left Heart Cath and Coronary Angiography;  Surgeon: Runell Gess, MD;  Location: [facility] INVASIVE CV LAB;  Service: Cardiovascular;  Laterality: N/A;  . Cardiac catheterization N/A 06/12/2015    Procedure: Coronary Stent Intervention;  Surgeon: Runell Gess, MD;  Location: [facility] INVASIVE CV LAB;  Service: Cardiovascular;  Laterality: N/A;   Social History:  reports that he has been smoking Cigarettes.  He has a 45 pack-year smoking history. He does not have any smokeless tobacco history on file. His alcohol and drug histories are not on file.  No Known Allergies  No family history on file.   Prior to Admission medications   Medication Sig Start Date End Date Taking? Authorizing Provider  acetaminophen (TYLENOL) 325 MG tablet Take 2 tablets (650 mg total) by mouth every 4 (four) hours as needed for  headache or mild pain. 06/14/15  Yes Leone Brand, NP  aspirin 81 MG chewable tablet Chew 1 tablet (81 mg total) by mouth daily. 06/14/15  Yes Leone Brand, NP  atorvastatin (LIPITOR) 10 MG tablet Take 1 tablet (10 mg total) by mouth daily. 06/14/15  Yes Leone Brand, NP  carvedilol (COREG) 3.125 MG tablet Take 1 tablet (3.125 mg total) by mouth 2 (two) times daily with a meal. 06/14/15  Yes Leone Brand, NP  furosemide (LASIX) 40 MG tablet TAKE ONE TABLET DAILY  WHEN TAKING POTASSIUM ONLY  AS NEEDED FOR FLUID RETENTION 06/22/15  Yes Dwana Melena, PA-C  HYDROcodone-acetaminophen (NORCO/VICODIN) 5-325 MG per tablet Take 1 tablet by mouth every 6 (six) hours as needed for moderate pain.   Yes Historical Provider, MD  lisinopril (PRINIVIL,ZESTRIL) 2.5 MG tablet Take 1 tablet (2.5 mg total) by mouth daily. Patient taking differently: Take 2.5 mg by mouth at bedtime.  06/14/15  Yes Leone Brand, NP  nitroGLYCERIN (NITROSTAT) 0.4 MG SL tablet Place 1 tablet (0.4 mg total) under the tongue every 5 (five) minutes as needed for chest pain. 06/14/15  Yes Leone Brand, NP  potassium chloride (K-DUR) 10 MEQ tablet TAKE ONE TABLET DAILY WHEN TAKING LASIX ONLY AS NEEDED  FOR FLUID RETENTION 06/22/15  Yes Dwana Melena, PA-C  ticagrelor (BRILINTA) 90 MG TABS tablet Take 1 tablet (90 mg total) by mouth 2 (two) times daily. 06/14/15  Yes Vernona Rieger  Ebony Hail, NP   Physical Exam: Filed Vitals:   06/25/15 1945  BP: 104/72  Pulse: 103  Temp:   Resp: 15    BP 104/72 mmHg  Pulse 103  Temp(Src) 98.3 F (36.8 C) (Oral)  Resp 15  Ht 6\' 1"  (1.854 m)  Wt 87.726 kg (193 lb 6.4 oz)  BMI 25.52 kg/m2  SpO2 96%  General Appearance:    Alert, oriented, no distress, appears stated age  Head:    Normocephalic, atraumatic  Eyes:    PERRL, EOMI, sclera non-icteric        Nose:   Nares without drainage or epistaxis. Mucosa, turbinates normal  Throat:   Moist mucous membranes. Oropharynx without erythema or exudate.   Neck:   Supple. No carotid bruits.  No thyromegaly.  No lymphadenopathy.   Back:     No CVA tenderness, no spinal tenderness  Lungs:     Clear to auscultation bilaterally, without wheezes, rhonchi or rales  Chest wall:    No tenderness to palpitation  Heart:    Regular rate and rhythm without murmurs, gallops, rubs  Abdomen:     Soft, non-tender, nondistended, normal bowel sounds, no organomegaly  Genitalia:    deferred  Rectal:    deferred  Extremities:   No clubbing, cyanosis or edema.  Pulses:   2+ and symmetric all extremities  Skin:   Skin color, texture, turgor normal, no rashes or lesions  Lymph nodes:   Cervical, supraclavicular, and axillary nodes normal  Neurologic:   CNII-XII intact. Normal strength, sensation and reflexes      throughout    Labs on Admission:  Basic Metabolic Panel:  Recent Labs Lab 06/25/15 1401  NA 130*  K 4.0  CL 95*  CO2 27  GLUCOSE 144*  BUN 12  CREATININE 0.74  CALCIUM 8.4*   Liver Function Tests: No results for input(s): AST, ALT, ALKPHOS, BILITOT, PROT, ALBUMIN in the last 168 hours. No results for input(s): LIPASE, AMYLASE in the last 168 hours. No results for input(s): AMMONIA in the last 168 hours. CBC:  Recent Labs Lab 06/25/15 1401  WBC 12.6*  HGB 11.6*  HCT 33.9*  MCV 100.3*  PLT 284   Cardiac Enzymes: No results for input(s): CKTOTAL, CKMB, CKMBINDEX, TROPONINI in the last 168 hours.  BNP (last 3 results)  Recent Labs  06/22/15 0934  PROBNP 86.0   CBG: No results for input(s): GLUCAP in the last 168 hours.  Radiological Exams on Admission: Dg Chest 2 View  06/25/2015   CLINICAL DATA:  Chest pain and shortness of breath with increased leg swelling  EXAM: CHEST - 2 VIEW  COMPARISON:  06/25/2015  FINDINGS: Cardiac shadow is stable from the film earlier in the day. Small bilateral pleural effusions are again noted. No significant vascular congestion is noted. Persistent density is noted in the medial aspect of the  right lung apex. This may be related to a confluence of vascular parenchymal shadows although the possibility focal infiltrate cannot be excluded.  IMPRESSION: Stable appearance of the chest when compared with the prior exam.   Electronically Signed   By: 06/27/2015 M.D.   On: 06/25/2015 14:13   Dg Chest 2 View  06/25/2015   CLINICAL DATA:  Acute heart failure  EXAM: CHEST  2 VIEW  COMPARISON:  None.  FINDINGS: Cardiomediastinal silhouette is unremarkable. Bilateral small pleural effusion. There is right base medially infiltrate highly suspicious for pneumonia. Streaky atelectasis or infiltrate right upper lobe paratracheal.  No convincing pulmonary edema.  IMPRESSION: Bilateral small pleural effusion. There is right base medially infiltrate highly suspicious for pneumonia. Streaky atelectasis or infiltrate right upper lobe paratracheal. No convincing pulmonary edema.   Electronically Signed   By: Natasha Mead M.D.   On: 06/25/2015 09:48   Ct Angio Chest Pe W/cm &/or Wo Cm  06/25/2015   CLINICAL DATA:  Positive D-dimer.  Pt reports increased leg swelling and SOB. Pt reports this has been increasing even with Lasix use. Pt denies chest pain.  EXAM: CT ANGIOGRAPHY CHEST WITH CONTRAST  TECHNIQUE: Multidetector CT imaging of the chest was performed using the standard protocol during bolus administration of intravenous contrast. Multiplanar CT image reconstructions and MIPs were obtained to evaluate the vascular anatomy.  CONTRAST:  OMNIPAQUE IOHEXOL 350 MG/ML SOLN  COMPARISON:  Prior chest radiograph  FINDINGS: Angiographic study: No evidence of a pulmonary embolus. Great vessels are normal in caliber.  Thoracic inlet: No mass or adenopathy. Visualized thyroid is unremarkable.  Mediastinum and hila: Heart normal in size. There is a moderate-sized pericardial effusion there are moderate coronary artery calcifications. Several mildly prominent mediastinal lymph nodes are noted, the largest a right peritracheal,  azygos level node measuring 12 mm in short axis, and a right subcarinal lymph node measuring 12 mm in short axis. There is also increased soft tissue along the hilar without a discrete enlarged lymph node.  Lungs and pleural: Moderate right and small left pleural effusions. There is dependent lower lobe atelectasis, right greater than left. Mild interstitial thickening is noted and there are changes of mild centrilobular and paraseptal emphysema in the upper lobes. No focal consolidation is seen to suggest pneumonia. No pneumothorax.  Limited upper abdomen: Trace amount of ascites adjacent to the liver.  Musculoskeletal:  No osteoblastic or osteolytic lesions.  Review of the MIP images confirms the above findings.  IMPRESSION: 1. No evidence of a pulmonary embolism. 2. Moderate pericardial effusion. There are also moderate right and small left pleural effusions and a trace amount of ascites. 3. Mild interstitial thickening. This may be chronic. Mild interstitial edema is possible. There is no evidence of airspace edema or pneumonia. 4. Lower lobe atelectasis adjacent to the pleural effusions, right greater than left. 5. Moderate coronary artery calcifications. 6. Mild emphysema.   Electronically Signed   By: Amie Portland M.D.   On: 06/25/2015 18:05    EKG: Independently reviewed.  Assessment/Plan Active Problems:   Acute on chronic combined systolic and diastolic heart failure   1. Acute on chronic combined systolic and diastolic CHF - EF 40-45% post STEMI, grade 1 diastolic dysfunction 1. Continue betablocker and ACEi 2. Got lasix 40mg  IV in ED (750 cc out so far in 3.5 hours) 3. Patient is only willing to stay over night, so will try and accelerate his treatment with another dose of IV lasix 40mg  at 10pm tonight (patient says he isnt sleeping anyhow) 1. Would like to have more time to try and diurese him more slowly, but patient is adamant about getting out of here tomorrow AM wether he is ready to  go or not. 2. Likely will end up still being AMA discharge at that time. 4. Heart failure pathway 5. Switching PO med from lasix to torsemide to see if this has better effect (ordered for tomorrow AM). 6. Daily BMP 1. Giving 20 meq K.dur with 2nd lasix dose tonight, potassium is 4.0 before starting lasix in ED 2. May or may not need to change  home dose of potassium PO with torsemide change (i cant really tell this from a single overnight stay)    Code Status: Full  Family Communication: Family at bedside Disposition Plan: Admit to inpatient, likely will leave AMA tomorrow   Time spent: 70 min  Demont Linford M. [facility] Pager [phone]  If 7AM-7PM, please contact the day team taking care of the patient [email or web address] Password [facility] 06/25/2015, 7:54 PM

## 2015-06-25 NOTE — ED Notes (Signed)
Attempted to call report

## 2015-06-25 NOTE — ED Provider Notes (Signed)
I saw and evaluated the patient, reviewed the resident's note and I agree with the findings and plan.   EKG Interpretation   Date/Time:  Friday June 25 2015 13:27:47 EDT Ventricular Rate:  108 PR Interval:  118 QRS Duration: 96 QT Interval:  308 QTC Calculation: 412 R Axis:   112 Text Interpretation:  Sinus tachycardia Abnormal ECG Confirmed by  Rubin Payor  MD, Harrold Donath [phone]) on 06/25/2015 1:34:31 PM Also confirmed by  Kandis Mannan ([id number])  on 06/25/2015 3:22:51 PM      Patient is a 54 year old male with CHF recently sorted on Lasix last week. This is in the setting of just having a STEMI 2 weeks ago. Patient's been unable to diuresis at home. Patient has bilateral pitting edema to the knee. We will admit for diuresis.  Dezmin Kittelson Randall An, MD 06/25/15 [phone]

## 2015-06-25 NOTE — ED Provider Notes (Signed)
Is just cardiology is is telemetry dischargedCSN: [id number]     Arrival date & time 06/25/15  1317 History   First MD Initiated Contact with Patient 06/25/15 1502     Chief Complaint  Patient presents with  . Leg Swelling  . Shortness of Breath   Patient is a 54 y.o. male presenting with shortness of breath. The history is provided by the patient. No language interpreter was used.  Shortness of Breath Severity:  Moderate Onset quality:  Gradual Timing:  Constant Progression:  Worsening Chronicity:  New Context: activity   Context: not animal exposure, not emotional upset, not fumes, not known allergens, not occupational exposure, not pollens, not smoke exposure, not strong odors, not URI and not weather changes   Relieved by:  Sitting up and position changes Exacerbated by: Lying down. Ineffective treatments: Po diuretics. Associated symptoms: cough (dry) and PND   Associated symptoms: no abdominal pain, no chest pain, no diaphoresis, no fever, no hemoptysis, no sputum production, no swollen glands, no vomiting and no wheezing   Risk factors: prolonged immobilization (recent admission) and recent surgery (cardiac stent placement)   Risk factors: no recent alcohol use, no family hx of DVT, no hx of cancer, no hx of PE/DVT, no obesity, no oral contraceptive use and no tobacco use     Past Medical History  Diagnosis Date  . Tobacco abuse   . Hyperlipidemia LDL goal <70 06/14/2015  . S/P angioplasty with stent, to LAD with Xience DES 06/12/15  06/14/2015  . ST elevation (STEMI) myocardial infarction involving left anterior descending coronary artery 06/12/2015  . Cardiomyopathy, ischemic, EF by Echo 40-45% 06/14/2015  . CAD in native artery, residual 60 LCX 06/14/2015   Past Surgical History  Procedure Laterality Date  . Shoulder surgery Right     Torn bicep  . Cardiac catheterization N/A 06/12/2015    Procedure: Left Heart Cath and Coronary Angiography;  Surgeon: Runell Gess, MD;   Location: [facility] INVASIVE CV LAB;  Service: Cardiovascular;  Laterality: N/A;  . Cardiac catheterization N/A 06/12/2015    Procedure: Coronary Stent Intervention;  Surgeon: Runell Gess, MD;  Location: [place] INVASIVE CV LAB;  Service: Cardiovascular;  Laterality: N/A;   No family history on file. Social History  Substance Use Topics  . Smoking status: Current Every Day Smoker -- 1.50 packs/day for 30 years    Types: Cigarettes  . Smokeless tobacco: None  . Alcohol Use: None    Review of Systems  Constitutional: Negative for fever and diaphoresis.  Respiratory: Positive for cough (dry) and shortness of breath. Negative for hemoptysis, sputum production and wheezing.        Orthopnea  Cardiovascular: Positive for leg swelling and PND. Negative for chest pain and palpitations.  Gastrointestinal: Negative for vomiting and abdominal pain.  Genitourinary: Negative for dysuria, urgency, frequency, flank pain and decreased urine volume.  All other systems reviewed and are negative.   Allergies  Review of patient's allergies indicates no known allergies.  Home Medications   Prior to Admission medications   Medication Sig Start Date End Date Taking? Authorizing Provider  acetaminophen (TYLENOL) 325 MG tablet Take 2 tablets (650 mg total) by mouth every 4 (four) hours as needed for headache or mild pain. 06/14/15  Yes Leone Brand, NP  aspirin 81 MG chewable tablet Chew 1 tablet (81 mg total) by mouth daily. 06/14/15  Yes Leone Brand, NP  atorvastatin (LIPITOR) 10 MG tablet Take 1 tablet (10 mg total) by  mouth daily. 06/14/15  Yes Leone Brand, NP  carvedilol (COREG) 3.125 MG tablet Take 1 tablet (3.125 mg total) by mouth 2 (two) times daily with a meal. 06/14/15  Yes Leone Brand, NP  furosemide (LASIX) 40 MG tablet TAKE ONE TABLET DAILY  WHEN TAKING POTASSIUM ONLY  AS NEEDED FOR FLUID RETENTION 06/22/15  Yes Dwana Melena, PA-C  HYDROcodone-acetaminophen (NORCO/VICODIN) 5-325 MG per tablet  Take 1 tablet by mouth every 6 (six) hours as needed for moderate pain.   Yes Historical Provider, MD  lisinopril (PRINIVIL,ZESTRIL) 2.5 MG tablet Take 1 tablet (2.5 mg total) by mouth daily. Patient taking differently: Take 2.5 mg by mouth at bedtime.  06/14/15  Yes Leone Brand, NP  nitroGLYCERIN (NITROSTAT) 0.4 MG SL tablet Place 1 tablet (0.4 mg total) under the tongue every 5 (five) minutes as needed for chest pain. 06/14/15  Yes Leone Brand, NP  potassium chloride (K-DUR) 10 MEQ tablet TAKE ONE TABLET DAILY WHEN TAKING LASIX ONLY AS NEEDED  FOR FLUID RETENTION 06/22/15  Yes Dwana Melena, PA-C  ticagrelor (BRILINTA) 90 MG TABS tablet Take 1 tablet (90 mg total) by mouth 2 (two) times daily. 06/14/15  Yes Leone Brand, NP   BP 106/66 mmHg  Pulse 95  Temp(Src) 98.1 F (36.7 C) (Oral)  Resp 18  Ht 6\' 1"  (1.854 m)  Wt 197 lb 1.6 oz (89.404 kg)  BMI 26.01 kg/m2  SpO2 100%   Physical Exam  Constitutional: [name] oriented to person, place, and time. No distress.  HENT:  Head: Normocephalic and atraumatic.  Eyes: Conjunctivae are normal. Pupils are equal, round, and reactive to light.  Neck: Normal range of motion. Neck supple. JVD present.  Cardiovascular: Tachycardia present.   Pulmonary/Chest: He has rales. He exhibits no tenderness.  Oxygen saturation 94-97% on RA, bibasilar rale on exam  Abdominal: Soft. Bowel sounds are normal. There is no tenderness. There is no rebound and no guarding.  Musculoskeletal: Normal range of motion. He exhibits edema (2+ pitting to bilateral LE).  Neurological: [name] alert and oriented to person, place, and time.  Skin: [name] not diaphoretic.    ED Course  Procedures  Labs Review Labs Reviewed  BASIC METABOLIC PANEL - Abnormal; Notable for the following:    Sodium 130 (*)    Chloride 95 (*)    Glucose, Bld 144 (*)    Calcium 8.4 (*)    All other components within normal limits  CBC - Abnormal; Notable for the following:    WBC 12.6 (*)     RBC 3.38 (*)    Hemoglobin 11.6 (*)    HCT 33.9 (*)    MCV 100.3 (*)    MCH 34.3 (*)    All other components within normal limits  D-DIMER, QUANTITATIVE (NOT AT [facility]) - Abnormal; Notable for the following:    D-Dimer, Quant 3.09 (*)    All other components within normal limits  BRAIN NATRIURETIC PEPTIDE - Abnormal; Notable for the following:    B Natriuretic Peptide 141.5 (*)    All other components within normal limits  BASIC METABOLIC PANEL  I-STAT TROPOININ, ED    Imaging Review Dg Chest 2 View  06/25/2015   CLINICAL DATA:  Chest pain and shortness of breath with increased leg swelling  EXAM: CHEST - 2 VIEW  COMPARISON:  06/25/2015  FINDINGS: Cardiac shadow is stable from the film earlier in the day. Small bilateral pleural effusions are again noted.  No significant vascular congestion is noted. Persistent density is noted in the medial aspect of the right lung apex. This may be related to a confluence of vascular parenchymal shadows although the possibility focal infiltrate cannot be excluded.  IMPRESSION: Stable appearance of the chest when compared with the prior exam.   Electronically Signed   By: Alcide Clever M.D.   On: 06/25/2015 14:13   Dg Chest 2 View  06/25/2015   CLINICAL DATA:  Acute heart failure  EXAM: CHEST  2 VIEW  COMPARISON:  None.  FINDINGS: Cardiomediastinal silhouette is unremarkable. Bilateral small pleural effusion. There is right base medially infiltrate highly suspicious for pneumonia. Streaky atelectasis or infiltrate right upper lobe paratracheal. No convincing pulmonary edema.  IMPRESSION: Bilateral small pleural effusion. There is right base medially infiltrate highly suspicious for pneumonia. Streaky atelectasis or infiltrate right upper lobe paratracheal. No convincing pulmonary edema.   Electronically Signed   By: Natasha Mead M.D.   On: 06/25/2015 09:48   Ct Angio Chest Pe W/cm &/or Wo Cm  06/25/2015   CLINICAL DATA:  Positive D-dimer.  Pt reports increased  leg swelling and SOB. Pt reports this has been increasing even with Lasix use. Pt denies chest pain.  EXAM: CT ANGIOGRAPHY CHEST WITH CONTRAST  TECHNIQUE: Multidetector CT imaging of the chest was performed using the standard protocol during bolus administration of intravenous contrast. Multiplanar CT image reconstructions and MIPs were obtained to evaluate the vascular anatomy.  CONTRAST:  OMNIPAQUE IOHEXOL 350 MG/ML SOLN  COMPARISON:  Prior chest radiograph  FINDINGS: Angiographic study: No evidence of a pulmonary embolus. Great vessels are normal in caliber.  Thoracic inlet: No mass or adenopathy. Visualized thyroid is unremarkable.  Mediastinum and hila: Heart normal in size. There is a moderate-sized pericardial effusion there are moderate coronary artery calcifications. Several mildly prominent mediastinal lymph nodes are noted, the largest a right peritracheal, azygos level node measuring 12 mm in short axis, and a right subcarinal lymph node measuring 12 mm in short axis. There is also increased soft tissue along the hilar without a discrete enlarged lymph node.  Lungs and pleural: Moderate right and small left pleural effusions. There is dependent lower lobe atelectasis, right greater than left. Mild interstitial thickening is noted and there are changes of mild centrilobular and paraseptal emphysema in the upper lobes. No focal consolidation is seen to suggest pneumonia. No pneumothorax.  Limited upper abdomen: Trace amount of ascites adjacent to the liver.  Musculoskeletal:  No osteoblastic or osteolytic lesions.  Review of the MIP images confirms the above findings.  IMPRESSION: 1. No evidence of a pulmonary embolism. 2. Moderate pericardial effusion. There are also moderate right and small left pleural effusions and a trace amount of ascites. 3. Mild interstitial thickening. This may be chronic. Mild interstitial edema is possible. There is no evidence of airspace edema or pneumonia. 4. Lower lobe  atelectasis adjacent to the pleural effusions, right greater than left. 5. Moderate coronary artery calcifications. 6. Mild emphysema.   Electronically Signed   By: Amie Portland M.D.   On: 06/25/2015 18:05   I have personally reviewed and evaluated these images and lab results as part of my medical decision-making.   EKG Interpretation   Date/Time:  Friday June 25 2015 13:27:47 EDT Ventricular Rate:  108 PR Interval:  118 QRS Duration: 96 QT Interval:  308 QTC Calculation: 412 R Axis:   112 Text Interpretation:  Sinus tachycardia Abnormal ECG Confirmed by  Rubin Payor  MD,  NATHAN [phone]) on 06/25/2015 1:34:31 PM Also confirmed by  Kandis Mannan ([id number])  on 06/25/2015 3:22:51 PM      MDM  Mr. Paris Lore is a 54 year old gentleman with above-stated history of present illness past medical history and physical exam. Patient presenting with SOB worsening over the past 3 days with associated orthopnea. Patient denies chest pain, diaphoresis, or nausea. Patient was seen in the hospital 2 weeks ago with left-sided chest pain radiating down left arm was diagnosed with a STEMI at that time with subsequent admission. Stent placed to LAD. Patient was medically maximized and discharged home roughly 1 week ago and has been asymptomatic until onset of SOB [facility]. Patient seen at [facility] at time of onset of shortness of breath and was started on 40 mg by mouth Lasix once a day which he has been taking as prescribed. Patient's symptoms have progressed despite this.  Physical exam of both notable for a middle-aged gentleman lying in bed in no acute distress. Patient is maintaining oxygen saturations at 93-94% on room air with mild tachypnea. Patient is tachycardic to the 100s. Afebrile. Hemostatically stable. Abdomen is benign. On exam reveals reveals to bibasilar lung fields. There is notable 2+ pitting edema to level of the proximal calves. Remainder of examination is unremarkable.  Chest x-ray showing  bilateral pleural effusions and interstitial edema. WBC 12.7. Cr 0.7. BNP 141. EKG is notable for mild ST depression which appears to be resolving from previous EKG. First troponin is 0.05. D-dimer is 3.02. CTA chest was ordered to rule out PE but notable for mild to moderate pericardial effusion as well as bilateral pleural effusions with interstitial markings concerning for CHF exacerbation.  Patient was admitted to the hospitalist service for further IV access and management of his CHF exacerbation. Patient understands and agrees with the plan and has no further questions or concerns at this time.  Patient care discussed with him on by my attending Dr. Corlis Leak  Clinical Impression: 1. CHF exacerbation 2. Hx of CAD w/ recent STEMI s/p stent placement 3. SOB  Angelina Ok, MD 06/25/15 8938  Courteney Randall An, MD 06/25/15 1017

## 2015-06-25 NOTE — Telephone Encounter (Signed)
Pt having SOB on exertion and swelling in his feet is worse from 2 days ago when in the office.  There is pitting and pt having a hard time bending over wife thinks that there could be some swelling in abdomen.  Pt was sitting there state he has no c/o of chest pain. Pt has been taking Lasix 40 mg PO daily, but it has not seem to make a difference.  Reviewed with Flex, recommeded that pt go to the Emergency Room to reviewed IV diuretics.  Pt and wife made aware agreed with plan no additional questions at this time.

## 2015-06-25 NOTE — ED Notes (Signed)
Pt's wife administering pt's home dose of carvedilol and atorvistatin. EDP is aware.

## 2015-06-26 ENCOUNTER — Encounter (HOSPITAL_COMMUNITY): Payer: Self-pay | Admitting: Certified Registered"

## 2015-06-26 ENCOUNTER — Inpatient Hospital Stay (HOSPITAL_COMMUNITY): Payer: Medicaid Other

## 2015-06-26 ENCOUNTER — Inpatient Hospital Stay (HOSPITAL_COMMUNITY): Payer: Medicaid Other | Admitting: Anesthesiology

## 2015-06-26 ENCOUNTER — Encounter (HOSPITAL_COMMUNITY)
Admission: EM | Disposition: A | Discharge status: Home / Self Care | Payer: Self-pay | Source: Home / Self Care | Attending: Thoracic Surgery (Cardiothoracic Vascular Surgery)

## 2015-06-26 DIAGNOSIS — I314 Cardiac tamponade: Secondary | ICD-10-CM

## 2015-06-26 DIAGNOSIS — I313 Pericardial effusion (noninflammatory): Secondary | ICD-10-CM | POA: Diagnosis present

## 2015-06-26 DIAGNOSIS — I255 Ischemic cardiomyopathy: Secondary | ICD-10-CM

## 2015-06-26 DIAGNOSIS — I5043 Acute on chronic combined systolic (congestive) and diastolic (congestive) heart failure: Principal | ICD-10-CM

## 2015-06-26 DIAGNOSIS — Z9889 Other specified postprocedural states: Secondary | ICD-10-CM

## 2015-06-26 DIAGNOSIS — Z720 Tobacco use: Secondary | ICD-10-CM

## 2015-06-26 DIAGNOSIS — I3139 Other pericardial effusion (noninflammatory): Secondary | ICD-10-CM

## 2015-06-26 HISTORY — PX: SUBXYPHOID PERICARDIAL WINDOW: SHX5075

## 2015-06-26 HISTORY — DX: Other pericardial effusion (noninflammatory): I31.39

## 2015-06-26 HISTORY — DX: Pericardial effusion (noninflammatory): I31.3

## 2015-06-26 HISTORY — DX: Cardiac tamponade: I31.4

## 2015-06-26 LAB — ABO/RH: ABO/RH(D): O NEG

## 2015-06-26 LAB — CBC
HCT: 35.8 % — ABNORMAL LOW (ref 39.0–52.0)
Hemoglobin: 12 g/dL — ABNORMAL LOW (ref 13.0–17.0)
MCH: 34 pg (ref 26.0–34.0)
MCHC: 33.5 g/dL (ref 30.0–36.0)
MCV: 101.4 fL — ABNORMAL HIGH (ref 78.0–100.0)
PLATELETS: 300 10*3/uL (ref 150–400)
RBC: 3.53 MIL/uL — ABNORMAL LOW (ref 4.22–5.81)
RDW: 13.9 % (ref 11.5–15.5)
WBC: 12.5 10*3/uL — AB (ref 4.0–10.5)

## 2015-06-26 LAB — PROTEIN, BODY FLUID

## 2015-06-26 LAB — TYPE AND SCREEN
ABO/RH(D): O NEG
ANTIBODY SCREEN: NEGATIVE

## 2015-06-26 LAB — BASIC METABOLIC PANEL
Anion gap: 11 (ref 5–15)
BUN: 15 mg/dL (ref 6–20)
CALCIUM: 8.4 mg/dL — AB (ref 8.9–10.3)
CO2: 26 mmol/L (ref 22–32)
CREATININE: 0.74 mg/dL (ref 0.61–1.24)
Chloride: 92 mmol/L — ABNORMAL LOW (ref 101–111)
GFR calc Af Amer: >60 mL/min (ref >60)
Glucose, Bld: 87 mg/dL (ref 65–99)
Potassium: 3.4 mmol/L — ABNORMAL LOW (ref 3.5–5.1)
SODIUM: 129 mmol/L — AB (ref 135–145)

## 2015-06-26 LAB — GRAM STAIN

## 2015-06-26 LAB — BODY FLUID CELL COUNT WITH DIFFERENTIAL
LYMPHS FL: 85 %
MONOCYTE-MACROPHAGE-SEROUS FLUID: 7 % — AB (ref 50–90)
Neutrophil Count, Fluid: 8 % (ref 0–25)
WBC FLUID: 5403 uL — AB (ref 0–1000)

## 2015-06-26 LAB — APTT: APTT: 34 s (ref 24–37)

## 2015-06-26 LAB — PROTIME-INR
INR: 1.33 (ref 0.00–1.49)
Prothrombin Time: 16.6 seconds — ABNORMAL HIGH (ref 11.6–15.2)

## 2015-06-26 LAB — PROTEIN, TOTAL: Total Protein: 5.6 g/dL — ABNORMAL LOW (ref 6.5–8.1)

## 2015-06-26 LAB — GLUCOSE, SEROUS FLUID: Glucose, Fluid: 82 mg/dL

## 2015-06-26 LAB — GLUCOSE, RANDOM: Glucose, Bld: 112 mg/dL — ABNORMAL HIGH (ref 65–99)

## 2015-06-26 SURGERY — CREATION, PERICARDIAL WINDOW, SUBXIPHOID APPROACH
Anesthesia: General | Site: Chest

## 2015-06-26 MED ORDER — KCL IN DEXTROSE-NACL 20-5-0.45 MEQ/L-%-% IV SOLN
INTRAVENOUS | Status: DC
  Administered 2015-06-26: 20:00:00 via INTRAVENOUS
  Filled 2015-06-26 (×2): qty 1000

## 2015-06-26 MED ORDER — ETOMIDATE 2 MG/ML IV SOLN
INTRAVENOUS | Status: DC | PRN
  Administered 2015-06-26: 20 mg via INTRAVENOUS

## 2015-06-26 MED ORDER — SUCCINYLCHOLINE CHLORIDE 20 MG/ML IJ SOLN
INTRAMUSCULAR | Status: AC
  Filled 2015-06-26: qty 1

## 2015-06-26 MED ORDER — SODIUM CHLORIDE 0.9 % IJ SOLN
INTRAMUSCULAR | Status: AC
  Filled 2015-06-26: qty 10

## 2015-06-26 MED ORDER — POTASSIUM CHLORIDE CRYS ER 20 MEQ PO TBCR
40.0000 meq | EXTENDED_RELEASE_TABLET | Freq: Once | ORAL | Status: AC
  Administered 2015-06-26: 40 meq via ORAL
  Filled 2015-06-26: qty 2

## 2015-06-26 MED ORDER — PROMETHAZINE HCL 25 MG/ML IJ SOLN: 6.2500 mg | INTRAMUSCULAR | Status: DC | PRN

## 2015-06-26 MED ORDER — SUFENTANIL CITRATE 50 MCG/ML IV SOLN
INTRAVENOUS | Status: DC | PRN
  Administered 2015-06-26 (×2): 10 ug via INTRAVENOUS
  Administered 2015-06-26: 20 ug via INTRAVENOUS
  Administered 2015-06-26: 10 ug via INTRAVENOUS

## 2015-06-26 MED ORDER — PROPOFOL 10 MG/ML IV BOLUS
INTRAVENOUS | Status: AC
  Filled 2015-06-26: qty 20

## 2015-06-26 MED ORDER — CEFUROXIME SODIUM 1.5 G IJ SOLR
1.5000 g | Freq: Two times a day (BID) | INTRAMUSCULAR | Status: AC
  Administered 2015-06-27 (×2): 1.5 g via INTRAVENOUS
  Filled 2015-06-26 (×4): qty 1.5

## 2015-06-26 MED ORDER — MIDAZOLAM HCL 2 MG/2ML IJ SOLN: 0.5000 mg | Freq: Once | INTRAMUSCULAR | Status: DC | PRN

## 2015-06-26 MED ORDER — ACETAMINOPHEN 160 MG/5ML PO SOLN
1000.0000 mg | Freq: Four times a day (QID) | ORAL | Status: DC
  Filled 2015-06-26: qty 40

## 2015-06-26 MED ORDER — ETOMIDATE 2 MG/ML IV SOLN
INTRAVENOUS | Status: AC
  Filled 2015-06-26: qty 10

## 2015-06-26 MED ORDER — FUROSEMIDE 10 MG/ML IJ SOLN: 40.0000 mg | Freq: Two times a day (BID) | INTRAMUSCULAR | Status: DC

## 2015-06-26 MED ORDER — FUROSEMIDE 10 MG/ML IJ SOLN
INTRAMUSCULAR | Status: AC
  Filled 2015-06-26: qty 4

## 2015-06-26 MED ORDER — DEXAMETHASONE SODIUM PHOSPHATE 4 MG/ML IJ SOLN
INTRAMUSCULAR | Status: AC
  Filled 2015-06-26: qty 1

## 2015-06-26 MED ORDER — TORSEMIDE 20 MG PO TABS: 20.0000 mg | ORAL_TABLET | Freq: Two times a day (BID) | ORAL | Status: DC

## 2015-06-26 MED ORDER — POTASSIUM CHLORIDE CRYS ER 20 MEQ PO TBCR: 40.0000 meq | EXTENDED_RELEASE_TABLET | Freq: Every day | ORAL | Status: DC

## 2015-06-26 MED ORDER — GLYCOPYRROLATE 0.2 MG/ML IJ SOLN
INTRAMUSCULAR | Status: AC
  Filled 2015-06-26: qty 2

## 2015-06-26 MED ORDER — HYDROMORPHONE HCL 1 MG/ML IJ SOLN
INTRAMUSCULAR | Status: AC
  Filled 2015-06-26: qty 1

## 2015-06-26 MED ORDER — MORPHINE SULFATE (PF) 2 MG/ML IV SOLN
2.0000 mg | INTRAVENOUS | Status: DC | PRN
  Administered 2015-06-26 (×2): 2 mg via INTRAVENOUS
  Filled 2015-06-26 (×2): qty 1

## 2015-06-26 MED ORDER — ONDANSETRON HCL 4 MG/2ML IJ SOLN
INTRAMUSCULAR | Status: AC
  Filled 2015-06-26: qty 2

## 2015-06-26 MED ORDER — DEXAMETHASONE SODIUM PHOSPHATE 4 MG/ML IJ SOLN
INTRAMUSCULAR | Status: DC | PRN
  Administered 2015-06-26: 4 mg via INTRAVENOUS

## 2015-06-26 MED ORDER — NEOSTIGMINE METHYLSULFATE 10 MG/10ML IV SOLN
INTRAVENOUS | Status: DC | PRN
  Administered 2015-06-26: 3 mg via INTRAVENOUS

## 2015-06-26 MED ORDER — MEPERIDINE HCL 25 MG/ML IJ SOLN: 6.2500 mg | INTRAMUSCULAR | Status: DC | PRN

## 2015-06-26 MED ORDER — NEOSTIGMINE METHYLSULFATE 10 MG/10ML IV SOLN
INTRAVENOUS | Status: AC
  Filled 2015-06-26: qty 1

## 2015-06-26 MED ORDER — ACETAMINOPHEN 500 MG PO TABS
1000.0000 mg | ORAL_TABLET | Freq: Four times a day (QID) | ORAL | Status: DC
  Administered 2015-06-26 – 2015-06-29 (×11): 1000 mg via ORAL
  Filled 2015-06-26 (×19): qty 2

## 2015-06-26 MED ORDER — MIDAZOLAM HCL 5 MG/5ML IJ SOLN
INTRAMUSCULAR | Status: DC | PRN
  Administered 2015-06-26: 2 mg via INTRAVENOUS

## 2015-06-26 MED ORDER — SUFENTANIL CITRATE 50 MCG/ML IV SOLN
INTRAVENOUS | Status: AC
  Filled 2015-06-26: qty 1

## 2015-06-26 MED ORDER — FUROSEMIDE 10 MG/ML IJ SOLN
20.0000 mg | Freq: Once | INTRAMUSCULAR | Status: AC
  Administered 2015-06-26: 20 mg via INTRAVENOUS

## 2015-06-26 MED ORDER — GLYCOPYRROLATE 0.2 MG/ML IJ SOLN
INTRAMUSCULAR | Status: DC | PRN
  Administered 2015-06-26: 0.4 mg via INTRAVENOUS

## 2015-06-26 MED ORDER — 0.9 % SODIUM CHLORIDE (POUR BTL) OPTIME
TOPICAL | Status: DC | PRN
  Administered 2015-06-26: 3000 mL

## 2015-06-26 MED ORDER — ROCURONIUM BROMIDE 50 MG/5ML IV SOLN
INTRAVENOUS | Status: AC
  Filled 2015-06-26: qty 1

## 2015-06-26 MED ORDER — CEFAZOLIN SODIUM-DEXTROSE 2-3 GM-% IV SOLR
INTRAVENOUS | Status: DC | PRN
  Administered 2015-06-26: 2 g via INTRAVENOUS

## 2015-06-26 MED ORDER — ROCURONIUM BROMIDE 100 MG/10ML IV SOLN
INTRAVENOUS | Status: DC | PRN
  Administered 2015-06-26: 30 mg via INTRAVENOUS

## 2015-06-26 MED ORDER — LIDOCAINE HCL (CARDIAC) 20 MG/ML IV SOLN
INTRAVENOUS | Status: AC
  Filled 2015-06-26: qty 5

## 2015-06-26 MED ORDER — PERFLUTREN LIPID MICROSPHERE
1.0000 mL | INTRAVENOUS | Status: DC | PRN
  Filled 2015-06-26: qty 10

## 2015-06-26 MED ORDER — SENNOSIDES-DOCUSATE SODIUM 8.6-50 MG PO TABS
1.0000 | ORAL_TABLET | Freq: Every day | ORAL | Status: DC
  Administered 2015-06-26 – 2015-06-28 (×3): 1 via ORAL
  Filled 2015-06-26 (×5): qty 1

## 2015-06-26 MED ORDER — FUROSEMIDE 10 MG/ML IJ SOLN: 80.0000 mg | Freq: Two times a day (BID) | INTRAMUSCULAR | Status: DC

## 2015-06-26 MED ORDER — ONDANSETRON HCL 4 MG/2ML IJ SOLN
INTRAMUSCULAR | Status: DC | PRN
  Administered 2015-06-26: 4 mg via INTRAVENOUS

## 2015-06-26 MED ORDER — HYDROMORPHONE HCL 1 MG/ML IJ SOLN
0.2500 mg | INTRAMUSCULAR | Status: DC | PRN
  Administered 2015-06-26 (×2): 0.5 mg via INTRAVENOUS

## 2015-06-26 MED ORDER — SUCCINYLCHOLINE CHLORIDE 20 MG/ML IJ SOLN
INTRAMUSCULAR | Status: DC | PRN
  Administered 2015-06-26: 120 mg via INTRAVENOUS

## 2015-06-26 MED ORDER — BISACODYL 5 MG PO TBEC
10.0000 mg | DELAYED_RELEASE_TABLET | Freq: Every day | ORAL | Status: DC
  Administered 2015-06-26 – 2015-06-28 (×3): 10 mg via ORAL
  Filled 2015-06-26 (×4): qty 2

## 2015-06-26 MED ORDER — SODIUM CHLORIDE 0.9 % IV SOLN: INTRAVENOUS | Status: DC

## 2015-06-26 MED ORDER — LACTATED RINGERS IV SOLN
INTRAVENOUS | Status: DC | PRN
  Administered 2015-06-26: 17:00:00 via INTRAVENOUS

## 2015-06-26 MED ORDER — POTASSIUM CHLORIDE 10 MEQ/50ML IV SOLN: 10.0000 meq | Freq: Every day | INTRAVENOUS | Status: DC | PRN

## 2015-06-26 MED ORDER — MIDAZOLAM HCL 2 MG/2ML IJ SOLN
INTRAMUSCULAR | Status: AC
  Filled 2015-06-26: qty 4

## 2015-06-26 SURGICAL SUPPLY — 59 items
ATTRACTOMAT 16X20 MAGNETIC DRP (DRAPES) IMPLANT
BENZOIN TINCTURE PRP APPL 2/3 (GAUZE/BANDAGES/DRESSINGS) IMPLANT
CANISTER SUCTION 2500CC (MISCELLANEOUS) ×2 IMPLANT
CATH THORACIC 28FR (CATHETERS) IMPLANT
CATH THORACIC 28FR RT ANG (CATHETERS) IMPLANT
CATH THORACIC 36FR (CATHETERS) IMPLANT
CATH THORACIC 36FR RT ANG (CATHETERS) IMPLANT
CONN ST 1/4X3/8  BEN (MISCELLANEOUS) ×1
CONN ST 1/4X3/8 BEN (MISCELLANEOUS) ×1 IMPLANT
CONT SPEC 4OZ CLIKSEAL STRL BL (MISCELLANEOUS) ×2 IMPLANT
COVER SURGICAL LIGHT HANDLE (MISCELLANEOUS) IMPLANT
DERMABOND ADVANCED (GAUZE/BANDAGES/DRESSINGS) ×1
DERMABOND ADVANCED .7 DNX12 (GAUZE/BANDAGES/DRESSINGS) ×1 IMPLANT
DRAIN CHANNEL 28F RND 3/8 FF (WOUND CARE) ×2 IMPLANT
DRAPE CARDIOVASCULAR INCISE (DRAPES) ×1
DRAPE LAPAROSCOPIC ABDOMINAL (DRAPES) IMPLANT
DRAPE SLUSH/WARMER DISC (DRAPES) ×2 IMPLANT
DRAPE SRG 135X102X78XABS (DRAPES) ×1 IMPLANT
ELECT REM PT RETURN 9FT ADLT (ELECTROSURGICAL) ×4
ELECTRODE REM PT RTRN 9FT ADLT (ELECTROSURGICAL) ×2 IMPLANT
GAUZE SPONGE 4X4 12PLY STRL (GAUZE/BANDAGES/DRESSINGS) ×2 IMPLANT
GLOVE BIO SURGEON STRL SZ 6.5 (GLOVE) ×2 IMPLANT
GLOVE BIOGEL PI IND STRL 6 (GLOVE) ×2 IMPLANT
GLOVE BIOGEL PI IND STRL 7.0 (GLOVE) ×3 IMPLANT
GLOVE BIOGEL PI INDICATOR 6 (GLOVE) ×2
GLOVE BIOGEL PI INDICATOR 7.0 (GLOVE) ×3
GLOVE ORTHO TXT STRL SZ7.5 (GLOVE) ×4 IMPLANT
GOWN STRL REUS W/ TWL LRG LVL3 (GOWN DISPOSABLE) ×3 IMPLANT
GOWN STRL REUS W/ TWL XL LVL3 (GOWN DISPOSABLE) IMPLANT
GOWN STRL REUS W/TWL LRG LVL3 (GOWN DISPOSABLE) ×3
GOWN STRL REUS W/TWL XL LVL3 (GOWN DISPOSABLE)
HEMOSTAT POWDER SURGIFOAM 1G (HEMOSTASIS) IMPLANT
KIT BASIN OR (CUSTOM PROCEDURE TRAY) ×2 IMPLANT
KIT ROOM TURNOVER OR (KITS) ×2 IMPLANT
NS IRRIG 1000ML POUR BTL (IV SOLUTION) ×6 IMPLANT
PACK CHEST (CUSTOM PROCEDURE TRAY) IMPLANT
PACK OPEN HEART (CUSTOM PROCEDURE TRAY) ×2 IMPLANT
PAD ARMBOARD 7.5X6 YLW CONV (MISCELLANEOUS) ×4 IMPLANT
PAD ELECT DEFIB RADIOL ZOLL (MISCELLANEOUS) ×2 IMPLANT
SPONGE GAUZE 4X4 12PLY STER LF (GAUZE/BANDAGES/DRESSINGS) ×2 IMPLANT
STRIP CLOSURE SKIN 1/2X4 (GAUZE/BANDAGES/DRESSINGS) IMPLANT
SUT MNCRL AB 3-0 PS2 18 (SUTURE) ×2 IMPLANT
SUT PDS AB 1 CTX 36 (SUTURE) ×2 IMPLANT
SUT SILK 1 TIES 10X30 (SUTURE) ×2 IMPLANT
SUT VIC AB 1 CTX 18 (SUTURE) ×2 IMPLANT
SUT VIC AB 1 CTX 36 (SUTURE) ×1
SUT VIC AB 1 CTX36XBRD ANBCTR (SUTURE) ×1 IMPLANT
SUT VIC AB 2-0 CT1 36 (SUTURE) ×2 IMPLANT
SUT VIC AB 3-0 SH 8-18 (SUTURE) ×6 IMPLANT
SWAB COLLECTION DEVICE MRSA (MISCELLANEOUS) IMPLANT
SYR 50ML SLIP (SYRINGE) IMPLANT
SYSTEM SAHARA CHEST DRAIN ATS (WOUND CARE) ×2 IMPLANT
TAPE CLOTH SURG 4X10 WHT LF (GAUZE/BANDAGES/DRESSINGS) ×2 IMPLANT
TOWEL OR 17X24 6PK STRL BLUE (TOWEL DISPOSABLE) ×2 IMPLANT
TOWEL OR 17X26 10 PK STRL BLUE (TOWEL DISPOSABLE) ×2 IMPLANT
TRAP SPECIMEN MUCOUS 40CC (MISCELLANEOUS) ×4 IMPLANT
TRAY FOLEY CATH 14FRSI W/METER (CATHETERS) ×2 IMPLANT
TUBE ANAEROBIC SPECIMEN COL (MISCELLANEOUS) IMPLANT
WATER STERILE IRR 1000ML POUR (IV SOLUTION) ×4 IMPLANT

## 2015-06-26 NOTE — Progress Notes (Signed)
  Echocardiogram 2D Echocardiogram has been performed.  Albaraa Swingle M 06/26/2015, 4:02 PM 

## 2015-06-26 NOTE — Transfer of Care (Signed)
Immediate Anesthesia Transfer of Care Note  Patient: Devin Ellis  Procedure(s) Performed: Procedure(s): SUBXYPHOID PERICARDIAL WINDOW (N/A)  Patient Location: PACU  Anesthesia Type:General  Level of Consciousness: awake, alert , oriented and patient cooperative  Airway & Oxygen Therapy: Patient Spontanous Breathing and Patient connected to nasal cannula oxygen  Post-op Assessment: Report given to RN, Post -op Vital signs reviewed and stable and Patient moving all extremities X 4  Post vital signs: Reviewed and stable  Last Vitals:  Filed Vitals:   06/26/15 1844  BP:   Pulse:   Temp: 36.5 C  Resp:     Complications: No apparent anesthesia complications

## 2015-06-26 NOTE — Progress Notes (Signed)
  Echocardiogram Echocardiogram Transesophageal has been performed.  Delcie Roch 06/26/2015, 6:21 PM

## 2015-06-26 NOTE — Progress Notes (Signed)
[facility] BRIEF POST-OP PROGRESS NOTE  Day of Surgery  S/P Procedure(s) (LRB): SUBXYPHOID PERICARDIAL WINDOW (N/A)   Awake and alert in PACU Reports breathing already much improved No hemoptysis NSR w/ stable BP Minimal chest tube output pCXR with mild CHF  Plan: Routine care.  One dose of lasix.  Keep head of bed elevated.  Purcell Nails 06/26/2015 7:11 PM

## 2015-06-26 NOTE — Progress Notes (Signed)
  Echocardiogram 2D Echocardiogram has been performed.  Devin Ellis 06/26/2015, 4:02 PM

## 2015-06-26 NOTE — Anesthesia Postprocedure Evaluation (Signed)
  Anesthesia Post-op Note  Patient: Devin Ellis  Procedure(s) Performed: Procedure(s): SUBXYPHOID PERICARDIAL WINDOW (N/A)  Patient Location: PACU  Anesthesia Type:General  Level of Consciousness: awake, alert , oriented and patient cooperative  Airway and Oxygen Therapy: Patient Spontanous Breathing and Patient connected to nasal cannula oxygen  Post-op Pain: none  Post-op Assessment: Post-op Vital signs reviewed, Patient's Cardiovascular Status Stable, Respiratory Function Stable, Patent Airway, No signs of Nausea or vomiting and Pain level controlled              Post-op Vital Signs: Reviewed and stable  Last Vitals:  Filed Vitals:   06/26/15 2000  BP: 131/79  Pulse: 94  Temp:   Resp: 23    Complications: soft tissue trauma to posterior oropharynx during TEE placement, bleeding resolved.  Dr. Cornelius Moras to follow.

## 2015-06-26 NOTE — Progress Notes (Signed)
[facility] PROGRESS NOTE  Devin Ellis RUE:[id number] DOB: 1961/05/07 DOA: 06/25/2015 PCP: Horald Pollen., PA-C  54 year old male with recent hospitalization for STEMI (anterolateral ST segment elevation in V2-V4, and inferior ST depression) with peak troponin >65, on 06/12/2015. Patient underwent emergent cardiac cath which showed 60% circumflex disease and 100% LAD which was treated with PCI and Xience DES. 2-D echo done showed EF of 40-45% with EF of 25-35% on cath. Patient placed on beta blocker, statin, ACE inhibitor and Brilinta and was recommended phase II cardiac rehabilitation. He was recommended for medical management for his 60% left circumflex stenosis. Also counseled on tobacco cessation. Patient was seen in the office on 8/23 for post hospital follow-up and reported shortness of breath with exertion and some orthopnea. He had gained almost 20 pounds and was started on 40 mg Lasix. He did not resolve the symptoms and patient presented to the hospital. He was found to have gained almost 25 pounds, with bilateral leg edema . D-dimer was elevated and a CT angiogram of the chest done was negative for PE showed moderate pericardial effusion, moderate right and small left pleural effusion with trace ascites. Patient admitted to telemetry.  Assessment/plan Acute on chronic systolic CHF Received total 2 dose of IV Lasix 40 mg upon admission. Has lost about 2 pounds. Not in distress clinically.  Patient initially wished to leave AMA but now sized to stay back for another day. I will place him on IV Lasix 80 mg twice daily. Replenish low potassium. -Continue aspirin, Coreg, lisinopril and statin. -Cardiology following.  Recent ST EMI with DES to LAD Continue aspirin, Coreg lisinopril, statin. Continue Brillinta.   Tobacco abuse Counseled strongly on cessation.  Code Status: Code Family Communication: None at bedside Disposition Plan: Patient initially planned to leave AMA but  decides to stay.   Consultants:  cardiology  Procedures:  CT angios chest.  Antibiotics:  None  HPI/Subjective: reports his breathing to be slightly better. Admission H&P reviewed.  Objective: Filed Vitals:   06/26/15 0902  BP: 102/74  Pulse: 72  Temp:   Resp:     Intake/Output Summary (Last 24 hours) at 06/26/15 1305 Last data filed at 06/26/15 0910  Gross per 24 hour  Intake    720 ml  Output   1900 ml  Net  -1180 ml   Filed Weights   06/25/15 1337 06/25/15 2139 06/26/15 0554  Weight: 87.726 kg (193 lb 6.4 oz) 89.404 kg (197 lb 1.6 oz) 86.637 kg (191 lb)    Exam:  General: Middle aged male in no distress HEENT: No pallor, most oral mucosa, JVD+ Cardiovascular: Normal S1 and S2, no murmurs rub or gallop Respiratory: Fine bibasilar crackles, rhonchi or wheeze GI: Soft, nondistended, nontender, bowel sounds present Musculoskeletal: warm , 1+ pitting edema CNS: Alert and oriented  Data Reviewed: Basic Metabolic Panel:  Recent Labs Lab 06/25/15 1401 06/26/15 0300  NA 130* 129*  K 4.0 3.4*  CL 95* 92*  CO2 27 26  GLUCOSE 144* 87  BUN 12 15  CREATININE 0.74 0.74  CALCIUM 8.4* 8.4*   Liver Function Tests: No results for input(s): AST, ALT, ALKPHOS, BILITOT, PROT, ALBUMIN in the last 168 hours. No results for input(s): LIPASE, AMYLASE in the last 168 hours. No results for input(s): AMMONIA in the last 168 hours. CBC:  Recent Labs Lab 06/25/15 1401  WBC 12.6*  HGB 11.6*  HCT 33.9*  MCV 100.3*  PLT 284   Cardiac Enzymes: No results for input(s): CKTOTAL,  CKMB, CKMBINDEX, TROPONINI in the last 168 hours. BNP (last 3 results)  Recent Labs  06/25/15 1538  BNP 141.5*    ProBNP (last 3 results)  Recent Labs  06/22/15 0934  PROBNP 86.0    CBG: No results for input(s): GLUCAP in the last 168 hours.  No results found for this or any previous visit (from the past 240 hour(s)).   Studies: Dg Chest 2 View  06/25/2015   CLINICAL  DATA:  Chest pain and shortness of breath with increased leg swelling  EXAM: CHEST - 2 VIEW  COMPARISON:  06/25/2015  FINDINGS: Cardiac shadow is stable from the film earlier in the day. Small bilateral pleural effusions are again noted. No significant vascular congestion is noted. Persistent density is noted in the medial aspect of the right lung apex. This may be related to a confluence of vascular parenchymal shadows although the possibility focal infiltrate cannot be excluded.  IMPRESSION: Stable appearance of the chest when compared with the prior exam.   Electronically Signed   By: Alcide Clever M.D.   On: 06/25/2015 14:13   Dg Chest 2 View  06/25/2015   CLINICAL DATA:  Acute heart failure  EXAM: CHEST  2 VIEW  COMPARISON:  None.  FINDINGS: Cardiomediastinal silhouette is unremarkable. Bilateral small pleural effusion. There is right base medially infiltrate highly suspicious for pneumonia. Streaky atelectasis or infiltrate right upper lobe paratracheal. No convincing pulmonary edema.  IMPRESSION: Bilateral small pleural effusion. There is right base medially infiltrate highly suspicious for pneumonia. Streaky atelectasis or infiltrate right upper lobe paratracheal. No convincing pulmonary edema.   Electronically Signed   By: Natasha Mead M.D.   On: 06/25/2015 09:48   Ct Angio Chest Pe W/cm &/or Wo Cm  06/25/2015   CLINICAL DATA:  Positive D-dimer.  Pt reports increased leg swelling and SOB. Pt reports this has been increasing even with Lasix use. Pt denies chest pain.  EXAM: CT ANGIOGRAPHY CHEST WITH CONTRAST  TECHNIQUE: Multidetector CT imaging of the chest was performed using the standard protocol during bolus administration of intravenous contrast. Multiplanar CT image reconstructions and MIPs were obtained to evaluate the vascular anatomy.  CONTRAST:  OMNIPAQUE IOHEXOL 350 MG/ML SOLN  COMPARISON:  Prior chest radiograph  FINDINGS: Angiographic study: No evidence of a pulmonary embolus. Great  vessels are normal in caliber.  Thoracic inlet: No mass or adenopathy. Visualized thyroid is unremarkable.  Mediastinum and hila: Heart normal in size. There is a moderate-sized pericardial effusion there are moderate coronary artery calcifications. Several mildly prominent mediastinal lymph nodes are noted, the largest a right peritracheal, azygos level node measuring 12 mm in short axis, and a right subcarinal lymph node measuring 12 mm in short axis. There is also increased soft tissue along the hilar without a discrete enlarged lymph node.  Lungs and pleural: Moderate right and small left pleural effusions. There is dependent lower lobe atelectasis, right greater than left. Mild interstitial thickening is noted and there are changes of mild centrilobular and paraseptal emphysema in the upper lobes. No focal consolidation is seen to suggest pneumonia. No pneumothorax.  Limited upper abdomen: Trace amount of ascites adjacent to the liver.  Musculoskeletal:  No osteoblastic or osteolytic lesions.  Review of the MIP images confirms the above findings.  IMPRESSION: 1. No evidence of a pulmonary embolism. 2. Moderate pericardial effusion. There are also moderate right and small left pleural effusions and a trace amount of ascites. 3. Mild interstitial thickening. This may be chronic.  Mild interstitial edema is possible. There is no evidence of airspace edema or pneumonia. 4. Lower lobe atelectasis adjacent to the pleural effusions, right greater than left. 5. Moderate coronary artery calcifications. 6. Mild emphysema.   Electronically Signed   By: Amie Portland M.D.   On: 06/25/2015 18:05    Scheduled Meds: . aspirin  81 mg Oral Daily  . atorvastatin  10 mg Oral Daily  . carvedilol  3.125 mg Oral BID WC  . heparin  5,000 Units Subcutaneous 3 times per day  . lisinopril  2.5 mg Oral QHS  . potassium chloride  40 mEq Oral Once  . sodium chloride  3 mL Intravenous Q12H  . ticagrelor  90 mg Oral BID  .  torsemide  20 mg Oral Daily   Continuous Infusions:      Time spent: 25 minutes    Drayven Marchena  [facility] Pager [phone]. If 7PM-7AM, please contact night-coverage at [email or web address], password [facility] 06/26/2015, 1:05 PM  LOS: 1 day

## 2015-06-26 NOTE — Progress Notes (Addendum)
Echocardiogram reviewed.  The patient has an EF of around 35-40% with hypokinesis of the distal anterior wall and apex.  There is a large pericardial effusion with tamponade.  Right ventricular function is normal with early collapse.  I spoke to Dr. Katrinka Blazing who is an interventional cardiologist who felt that since he is on BRILINTA that surgical removal may be safer.  I spoke with the cardiac surgeon Dr. Cornelius Moras who will see the patient to consider pericardial window.  We'll make the patient nothing by mouth as he just ate 2 hours ago and stopped intravenous diuresis.  Begin IV fluids.  BRILINTA was evidently stopped today and should be restarted as soon as possible in light of the recent stent.  Darden Palmer MD [facility]

## 2015-06-26 NOTE — Consult Note (Addendum)
Cardiology Consult Note  Admit date: 06/25/2015 Name: Devin Ellis 54 y.o.  male DOB:  [birth year] MRN:  [id number]  Today's date:  06/26/2015  Referring Physician:    [facility]  Reason for Consultation:    Congestive heart failure   IMPRESSIONS: 1.  Worsening congestive heart failure just 2 weeks following acute anterior infarction-one would the concerned about the development of either an aneurysm or pseudoaneurysm with rupture into the pericardial space, he has no murmur that would suggest a VSD  with new onset of congestive heart failure he is at high risk of sudden death or other cardiac complications this early after intervention. 2.  Coronary artery disease with recent anterior infarction treated with drug-eluting stent 3.  Tobacco abuse 4.  Hyperlipidemia 5.  Acute systolic congestive heart failure on chronic  RECOMMENDATION: 1.  He will need to be hospitalized and received intravenous diuresis.  Low-dose ACE inhibitor, spironolactone 2.  Repeat echocardiogram to evaluate LV function 3.  Likely if his ejection fraction is low will need to have a LifeVest prior to discharge as he is at high risk of sudden death with his current clinical presentation 4.  I had an extensive discussion with the patient as he was talking about wanting to leave against the advice due to social situations with his young children and his wife.  There have evidently been significant home stresses in terms of employment.  I strongly advised him to stay in the hospital because of his high risk of sudden death and his decompensated heart failure.  Long conversation had with him about the importance of doing this for his wife and children.  HISTORY: This 54 year old male has previously been in good health.  He had a biceps tendon rupture and June of this year and had surgery.  He has smoked and moved here from [place] about a year ago for better weather.  He has been working part-time at [employer].   He was in his usual state of health until he presented on August 13 2 hours of chest pain while moving furniture in an apartment.  He was found to have ST elevation anteriorly and underwent emergent catheterization with a short door to balloon time.  He was about 2-1/2-3 hours following onset of pain prior to the artery being opened.  He was discharged after a couple of days and reportedly had a discharge echo of 40-45% with anterior hypokinesis.  It was lower at cath.  He began to develop worsening dyspnea and edema and when seen in hospital follow-up on the 23rd he had gained 20 pounds and had some nausea.  He had worsening orthopnea and PND and some lower extremity edema.  He was treated with furosemide and had somewhat low blood pressure.  He presented to the emergency room for worsening shortness of breath last evening.  A CT angiogram did not show any evidence of pulmonary emboli but had a moderate pericardial effusion.  He had small bilateral effusions.  BNP was mildly elevated.  He has not had worsening chest pain or recurrent chest pain since he was admitted.  His admission he has been wanting to leave the hospital stating that he needs to get home to his family to care for his son who is 1.  Past Medical History  Diagnosis Date  . Tobacco abuse   . Hyperlipidemia LDL goal <70 06/14/2015  . S/P angioplasty with stent, to LAD with Xience DES 06/12/15  06/14/2015  . ST elevation (STEMI) myocardial  infarction involving left anterior descending coronary artery 06/12/2015  . Cardiomyopathy, ischemic, EF by Echo 40-45% 06/14/2015  . CAD in native artery, residual 60 LCX 06/14/2015     Past Surgical History  Procedure Laterality Date  . Shoulder surgery Right     Torn bicep  . Cardiac catheterization N/A 06/12/2015    Procedure: Left Heart Cath and Coronary Angiography;  Surgeon: Runell Gess, MD;  Location[facility] INVASIVE CV LAB;  Service: Cardiovascular;  Laterality: N/A;  . Cardiac catheterization  N/A 06/12/2015    Procedure: Coronary Stent Intervention;  Surgeon: Runell Gess, MD;  Location: [facility] INVASIVE CV LAB;  Service: Cardiovascular;  Laterality: N/A;    Allergies:  has No Known Allergies.   Medications: Prior to Admission medications   Medication Sig Start Date End Date Taking? Authorizing Provider  acetaminophen (TYLENOL) 325 MG tablet Take 2 tablets (650 mg total) by mouth every 4 (four) hours as needed for headache or mild pain. 06/14/15  Yes Leone Brand, NP  aspirin 81 MG chewable tablet Chew 1 tablet (81 mg total) by mouth daily. 06/14/15  Yes Leone Brand, NP  atorvastatin (LIPITOR) 10 MG tablet Take 1 tablet (10 mg total) by mouth daily. 06/14/15  Yes Leone Brand, NP  carvedilol (COREG) 3.125 MG tablet Take 1 tablet (3.125 mg total) by mouth 2 (two) times daily with a meal. 06/14/15  Yes Leone Brand, NP  furosemide (LASIX) 40 MG tablet TAKE ONE TABLET DAILY  WHEN TAKING POTASSIUM ONLY  AS NEEDED FOR FLUID RETENTION 06/22/15  Yes Dwana Melena, PA-C  HYDROcodone-acetaminophen (NORCO/VICODIN) 5-325 MG per tablet Take 1 tablet by mouth every 6 (six) hours as needed for moderate pain.   Yes Historical Provider, MD  lisinopril (PRINIVIL,ZESTRIL) 2.5 MG tablet Take 1 tablet (2.5 mg total) by mouth daily. Patient taking differently: Take 2.5 mg by mouth at bedtime.  06/14/15  Yes Leone Brand, NP  nitroGLYCERIN (NITROSTAT) 0.4 MG SL tablet Place 1 tablet (0.4 mg total) under the tongue every 5 (five) minutes as needed for chest pain. 06/14/15  Yes Leone Brand, NP  potassium chloride (K-DUR) 10 MEQ tablet TAKE ONE TABLET DAILY WHEN TAKING LASIX ONLY AS NEEDED  FOR FLUID RETENTION 06/22/15  Yes Dwana Melena, PA-C  ticagrelor (BRILINTA) 90 MG TABS tablet Take 1 tablet (90 mg total) by mouth 2 (two) times daily. 06/14/15  Yes Leone Brand, NP  torsemide (DEMADEX) 20 MG tablet Take 1 tablet (20 mg total) by mouth 2 (two) times daily. 06/26/15   Eddie North, MD    Family  History: No family status information on file.    Social History:   reports that he has been smoking Cigarettes.  He has a 45 pack-year smoking history. He does not have any smokeless tobacco history on file.   Social History   Social History Narrative   Married.  Five children total between him and his wife.  He works for [employer] in [employer].     has some social stresses at this time and states his son is starting school Monday and he has to leave the hospital.  Review of Systems: Recent biceps tendon rupture.  Shortness of breath as noted above.  No arthritis.  Other than as noted above remainder of the review of systems is unremarkable.  Physical Exam: BP 102/74 mmHg  Pulse 72  Temp(Src) 97.9 F (36.6 C) (Oral)  Resp 18  Ht 6\' 1"  (1.854  m)  Wt 86.637 kg (191 lb)  BMI 25.20 kg/m2  SpO2 95%  General appearance: Thin, distressed white male appearing older than stated age Head: Normocephalic, without obvious abnormality, atraumatic Eyes: conjunctivae/corneas clear. PERRL, EOM's intact. Fundi not examined  Neck: no adenopathy, no carotid bruit, supple, symmetrical, trachea midline and JVD is elevated sitting position Lungs: Reduced breath sounds at bases, mild rales Heart: regular rate and rhythm, S1, S2 normal, no murmur, click, rub or gallop Abdomen: soft, non-tender; bowel sounds normal; no masses,  no organomegaly Rectal: deferred Extremities: No deformity, 2+ peripheral edema Pulses: 2+ and symmetric Skin: Skin color, texture, turgor normal. No rashes or lesions Neurologic: Grossly normal  Labs: CBC  Recent Labs  06/25/15 1401  WBC 12.6*  RBC 3.38*  HGB 11.6*  HCT 33.9*  PLT 284  MCV 100.3*  MCH 34.3*  MCHC 34.2  RDW 13.7   CMP   Recent Labs  06/26/15 0300  NA 129*  K 3.4*  CL 92*  CO2 26  GLUCOSE 87  BUN 15  CREATININE 0.74  CALCIUM 8.4*  GFRNONAA >60  GFRAA >60   BNP (last 3 results)    Component Value Date/Time   BNP 141.5*  06/25/2015 1538     Radiology: Bilateral pleural effusions  EKG: Recent anterior infarction with ST elevation suggestive of aneurysm in the lateral leads  Signed:  W. Ashley Royalty MD [facility]   Cardiology Consultant  06/26/2015, 1:03 PM

## 2015-06-26 NOTE — Discharge Summary (Deleted)
Physician Discharge Summary  Devin Ellis NLG:[id number] DOB: 07/02/1961 DOA: 06/25/2015  PCP: Horald Pollen., PA-C  Admit date: 06/25/2015 Discharge date: 06/26/2015  Time spent: 25 minutes   Recommendations for Outpatient Follow-up:  Patient signed out AMA. Prescription provided for torsemide 20 mg twice a day. Patient should follow-up with cardiology as outpatient as scheduled.  Discharge Diagnoses:  principle problem   Acute on chronic combined systolic and diastolic heart failure  Active problem Recent STEMI with angioplasty to LAD with DES Ischemic cardiomyopathy Tobacco abuse Hyperlipidemia   Discharge Condition: Signed out AMA  Diet recommendation: Heart healthy  Filed Weights   06/25/15 1337 06/25/15 2139 06/26/15 0554  Weight: 87.726 kg (193 lb 6.4 oz) 89.404 kg (197 lb 1.6 oz) 86.637 kg (191 lb)    History of present illness:  54 year old male with recent hospitalization for STEMI (anterolateral ST segment elevation in V2-V4, and inferior ST depression) with peak troponin >65,  on 06/12/2015. Patient underwent emergent cardiac cath which showed 60% circumflex disease and 100% LAD which was treated with PCI and Xience DES. 2-D echo done showed EF of 40-45% with EF of 25-35% on cath. Patient placed on beta blocker, statin, ACE inhibitor and Brilinta and was recommended phase II cardiac rehabilitation. He was recommended for medical management for his 60% left circumflex stenosis. Also counseled on tobacco cessation. Patient was seen in the office on 8/23 for post hospital follow-up and reported shortness of breath with exertion and some orthopnea. He had gained almost 20 pounds and was started on 40 mg Lasix. He did not resolve the symptoms and patient presented to the hospital. He was found to have gained almost 25 pounds, with bilateral leg edema . D-dimer was elevated and a CT angiogram of the chest done was negative for PE showed moderate pericardial effusion, moderate  right and small left pleural effusion with trace ascites. Patient admitted to telemetry.  Hospital Course:  Acute on chronic systolic CHF Received total 2 dose of IV Lasix 40 mg upon admission. Has lost about 2 pounds. Not in distress clinically. Patient however does not wish to stay in the hospital for further diuresis stating that he wants to go home and stay with his family. -On repeated request and instruction patient is clear that he does not want to stay further. I have discussed in detail with him that about the health risk on leaving AMA, including worsened CHF symptoms and risk of death. -Seen by cardiology and recommends torsemide 20 mg twice a day. He should follow-up with cardiology in the office within one week. -Patient instructed to return to the ED if symptoms do not improve or worsen. Recommended to be compliant with his diet and medications. -continue potassium supplements.  Recent ST EMI with DES to LAD Continue aspirin, Coreg lisinopril, statin. Continue Brillinta.   Tobacco abuse Counseled strongly on cessation.  Procedures:  None  Consultations:  Cardiology  Discharge Exam: Filed Vitals:   06/26/15 0902  BP: 102/74  Pulse: 72  Temp:   Resp:     General: Middle aged male in no distress HEENT: No pallor, most oral mucosa, JVD+ Cardiovascular: Normal S1 and S2, no murmurs rub or gallop Respiratory: Fine bibasilar crackles, rhonchi or wheeze GI: Soft, nondistended, nontender, bowel sounds present Musculoskeletal: warm , 1+ pitting edema CNS: Alert and oriented  Discharge Instructions    Current Discharge Medication List    START taking these medications   Details  torsemide (DEMADEX) 20 MG tablet Take 1 tablet (20  mg total) by mouth 2 (two) times daily. Qty: 60 tablet, Refills: 0      CONTINUE these medications which have NOT CHANGED   Details  acetaminophen (TYLENOL) 325 MG tablet Take 2 tablets (650 mg total) by mouth every 4 (four) hours  as needed for headache or mild pain.    aspirin 81 MG chewable tablet Chew 1 tablet (81 mg total) by mouth daily.    atorvastatin (LIPITOR) 10 MG tablet Take 1 tablet (10 mg total) by mouth daily. Qty: 30 tablet, Refills: 6    carvedilol (COREG) 3.125 MG tablet Take 1 tablet (3.125 mg total) by mouth 2 (two) times daily with a meal. Qty: 60 tablet, Refills: 6    HYDROcodone-acetaminophen (NORCO/VICODIN) 5-325 MG per tablet Take 1 tablet by mouth every 6 (six) hours as needed for moderate pain.    lisinopril (PRINIVIL,ZESTRIL) 2.5 MG tablet Take 1 tablet (2.5 mg total) by mouth daily. Qty: 30 tablet, Refills: 6    nitroGLYCERIN (NITROSTAT) 0.4 MG SL tablet Place 1 tablet (0.4 mg total) under the tongue every 5 (five) minutes as needed for chest pain. Qty: 25 tablet, Refills: 4    potassium chloride (K-DUR) 10 MEQ tablet TAKE ONE TABLET DAILY WHEN TAKING LASIX ONLY AS NEEDED  FOR FLUID RETENTION Qty: 30 tablet, Refills: 6    ticagrelor (BRILINTA) 90 MG TABS tablet Take 1 tablet (90 mg total) by mouth 2 (two) times daily. Qty: 60 tablet, Refills: 11      STOP taking these medications     furosemide (LASIX) 40 MG tablet        No Known Allergies Follow-up Information    Follow up with MAIER,ANDREW C., PA-C. Schedule an appointment as soon as possible for a visit in 1 week.   Specialty:  Physician Assistant   Contact information:   [address] [place] [address] [phone]        The results of significant diagnostics from this hospitalization (including imaging, microbiology, ancillary and laboratory) are listed below for reference.    Significant Diagnostic Studies: Dg Chest 2 View  06/25/2015   CLINICAL DATA:  Chest pain and shortness of breath with increased leg swelling  EXAM: CHEST - 2 VIEW  COMPARISON:  06/25/2015  FINDINGS: Cardiac shadow is stable from the film earlier in the day. Small bilateral pleural effusions are again noted. No significant vascular  congestion is noted. Persistent density is noted in the medial aspect of the right lung apex. This may be related to a confluence of vascular parenchymal shadows although the possibility focal infiltrate cannot be excluded.  IMPRESSION: Stable appearance of the chest when compared with the prior exam.   Electronically Signed   By: Alcide Clever M.D.   On: 06/25/2015 14:13   Dg Chest 2 View  06/25/2015   CLINICAL DATA:  Acute heart failure  EXAM: CHEST  2 VIEW  COMPARISON:  None.  FINDINGS: Cardiomediastinal silhouette is unremarkable. Bilateral small pleural effusion. There is right base medially infiltrate highly suspicious for pneumonia. Streaky atelectasis or infiltrate right upper lobe paratracheal. No convincing pulmonary edema.  IMPRESSION: Bilateral small pleural effusion. There is right base medially infiltrate highly suspicious for pneumonia. Streaky atelectasis or infiltrate right upper lobe paratracheal. No convincing pulmonary edema.   Electronically Signed   By: Natasha Mead M.D.   On: 06/25/2015 09:48   Ct Angio Chest Pe W/cm &/or Wo Cm  06/25/2015   CLINICAL DATA:  Positive D-dimer.  Pt reports increased leg  swelling and SOB. Pt reports this has been increasing even with Lasix use. Pt denies chest pain.  EXAM: CT ANGIOGRAPHY CHEST WITH CONTRAST  TECHNIQUE: Multidetector CT imaging of the chest was performed using the standard protocol during bolus administration of intravenous contrast. Multiplanar CT image reconstructions and MIPs were obtained to evaluate the vascular anatomy.  CONTRAST:  OMNIPAQUE IOHEXOL 350 MG/ML SOLN  COMPARISON:  Prior chest radiograph  FINDINGS: Angiographic study: No evidence of a pulmonary embolus. Great vessels are normal in caliber.  Thoracic inlet: No mass or adenopathy. Visualized thyroid is unremarkable.  Mediastinum and hila: Heart normal in size. There is a moderate-sized pericardial effusion there are moderate coronary artery calcifications. Several mildly  prominent mediastinal lymph nodes are noted, the largest a right peritracheal, azygos level node measuring 12 mm in short axis, and a right subcarinal lymph node measuring 12 mm in short axis. There is also increased soft tissue along the hilar without a discrete enlarged lymph node.  Lungs and pleural: Moderate right and small left pleural effusions. There is dependent lower lobe atelectasis, right greater than left. Mild interstitial thickening is noted and there are changes of mild centrilobular and paraseptal emphysema in the upper lobes. No focal consolidation is seen to suggest pneumonia. No pneumothorax.  Limited upper abdomen: Trace amount of ascites adjacent to the liver.  Musculoskeletal:  No osteoblastic or osteolytic lesions.  Review of the MIP images confirms the above findings.  IMPRESSION: 1. No evidence of a pulmonary embolism. 2. Moderate pericardial effusion. There are also moderate right and small left pleural effusions and a trace amount of ascites. 3. Mild interstitial thickening. This may be chronic. Mild interstitial edema is possible. There is no evidence of airspace edema or pneumonia. 4. Lower lobe atelectasis adjacent to the pleural effusions, right greater than left. 5. Moderate coronary artery calcifications. 6. Mild emphysema.   Electronically Signed   By: Amie Portland M.D.   On: 06/25/2015 18:05    Microbiology: Recent Results (from the past 240 hour(s))  Urine culture     Status: None   Collection Time: 06/16/15 12:55 PM  Result Value Ref Range Status   Colony Count NO GROWTH  Final   Organism ID, Bacteria NO GROWTH  Final     Labs: Basic Metabolic Panel:  Recent Labs Lab 06/25/15 1401 06/26/15 0300  NA 130* 129*  K 4.0 3.4*  CL 95* 92*  CO2 27 26  GLUCOSE 144* 87  BUN 12 15  CREATININE 0.74 0.74  CALCIUM 8.4* 8.4*   Liver Function Tests: No results for input(s): AST, ALT, ALKPHOS, BILITOT, PROT, ALBUMIN in the last 168 hours. No results for input(s):  LIPASE, AMYLASE in the last 168 hours. No results for input(s): AMMONIA in the last 168 hours. CBC:  Recent Labs Lab 06/25/15 1401  WBC 12.6*  HGB 11.6*  HCT 33.9*  MCV 100.3*  PLT 284   Cardiac Enzymes: No results for input(s): CKTOTAL, CKMB, CKMBINDEX, TROPONINI in the last 168 hours. BNP: BNP (last 3 results)  Recent Labs  06/25/15 1538  BNP 141.5*    ProBNP (last 3 results)  Recent Labs  06/22/15 0934  PROBNP 86.0    CBG: No results for input(s): GLUCAP in the last 168 hours.     SignedEddie North  [facility] 06/26/2015, 12:48 PM

## 2015-06-26 NOTE — Anesthesia Procedure Notes (Addendum)
Anesthesia Procedure Note CVP: Timeout, sterile prep, drape, FBP R neck.  Trendelenburg position.  1% lido local, finder and trocar RIJ 1st pass with US guidance.  2 lumen placed over J wire. Biopatch and sterile dressing on.  Patient tolerated well.  VSS.  Jenita Seashore, MD  16:50-17:02 Procedure Name: Intubation Date/Time: 06/26/2015 5:31 PM Performed by: Claris Che Pre-anesthesia Checklist: Patient identified, Emergency Drugs available, Suction available, Patient being monitored and Timeout performed Patient Re-evaluated:Patient Re-evaluated prior to inductionOxygen Delivery Method: Circle system utilized Preoxygenation: Pre-oxygenation with 100% oxygen Intubation Type: IV induction, Rapid sequence and Cricoid Pressure applied Ventilation: Mask ventilation without difficulty Laryngoscope Size: Mac and 4 Grade View: Grade I Tube type: Oral Tube size: 8.0 mm Number of attempts: 1 Airway Equipment and Method: Stylet Placement Confirmation: ETT inserted through vocal cords under direct vision,  positive ETCO2 and breath sounds checked- equal and bilateral Secured at: 23 cm Tube secured with: Tape Dental Injury: Teeth and Oropharynx as per pre-operative assessment

## 2015-06-26 NOTE — Anesthesia Preprocedure Evaluation (Addendum)
Anesthesia Evaluation  Patient identified by MRN, date of birth, ID band Patient awake    Reviewed: Allergy & Precautions, NPO status , Patient's Chart, lab work & pertinent test resultsPreop documentation limited or incomplete due to emergent nature of procedure.  History of Anesthesia Complications Negative for: history of anesthetic complications  Airway Mallampati: I  TM Distance: >3 FB Neck ROM: Full    Dental  (+) Edentulous Upper, Edentulous Lower   Pulmonary Current Smoker,  breath sounds clear to auscultation        Cardiovascular hypertension, Pt. on medications and Pt. on home beta blockers - angina+ CAD, + Past MI (06/12/15) and + Cardiac Stents (LAD DES) Rhythm:Regular Rate:Tachycardia  06/26/15 ECHO: EF 35-40%, severe anterolat hypokinesis, large pericardial effusion, right atrial chamber collapse consistent with moderatetamponade physiology.    Neuro/Psych negative neurological ROS     GI/Hepatic negative GI ROS,   Endo/Other  negative endocrine ROS  Renal/GU negative Renal ROS     Musculoskeletal   Abdominal   Peds  Hematology negative hematology ROS (+)   Anesthesia Other Findings   Reproductive/Obstetrics                           Anesthesia Physical Anesthesia Plan  ASA: IV and emergent  Anesthesia Plan: General   Post-op Pain Management:    Induction: Intravenous and Rapid sequence  Airway Management Planned: Oral ETT  Additional Equipment: Arterial line, CVP, Ultrasound Guidance Line Placement and TEE  Intra-op Plan:   Post-operative Plan: Extubation in OR  Informed Consent: I have reviewed the patients History and Physical, chart, labs and discussed the procedure including the risks, benefits and alternatives for the proposed anesthesia with the patient or authorized representative who has indicated his/her understanding and acceptance.     Plan Discussed  with: CRNA and Surgeon  Anesthesia Plan Comments: (Plan routine monitors, A line, Central catheter, GETA with TEE)        Anesthesia Quick Evaluation

## 2015-06-26 NOTE — Consult Note (Signed)
[address]       [place] [address]             [phone]          CARDIOTHORACIC SURGERY CONSULTATION REPORT  PCP is Horald Pollen., PA-C Referring Provider is Othella Boyer, MD Primary Cardiologist is Runell Gess, MD   Reason for consultation:  Pericardial Tamponade  HPI:  Patient is a 54 year old male with no previous cardiac history who presented 06/12/2015 with an acute anterior wall ST segment elevation myocardial infarction. He was taken directly to the cardiac cath lab by Dr. Allyson Sabal where he was found to have acute occlusion of the mid left anterior descending coronary artery. He underwent PCI and stenting of the LAD using a drug-eluting stent. The procedure was uncomplicated.  Peak troponin was greater than 65. Echocardiogram performed  revealed ejection fraction estimated 40-45% with a small pericardial effusion.  He recovered uneventfully and was discharged from the hospital on aspirin, Brilinta, beta blocker, statin, and ACE inhibitor.  The patient states that he did well initially, but over the last 5-7 days he has developed progressive shortness breath, orthopnea, lower extremity edema and a 20 pound weight gain. He was seen in the office by Jones Skene on 06/22/2015.  He was started on oral Lasix.  Symptoms progressed, prompting the patient to present to the hospital for admission yesterday evening. An echocardiogram was performed this morning demonstrating large pericardial effusion with signs of early pericardial tamponade. Cardiothoracic surgical consultation was requested.  The patient is married and lives locally in [place] with his wife. He denies any symptoms of chest pain or chest tightness. He reports progressive shortness of breath and orthopnea over the last week. He denies any history of dizzy spells or syncope. He has not had palpitations. He denies fevers or chills. Appetite is fair. He denies abdominal discomfort or back pain.  Bowel function is normal. The remainder of the patient's review of systems is noncontributory.  Past Medical History  Diagnosis Date  . Tobacco abuse   . Hyperlipidemia LDL goal <70 06/14/2015  . S/P angioplasty with stent, to LAD with Xience DES 06/12/15  06/14/2015  . ST elevation (STEMI) myocardial infarction involving left anterior descending coronary artery 06/12/2015  . Cardiomyopathy, ischemic, EF by Echo 40-45% 06/14/2015  . CAD in native artery, residual 60 LCX 06/14/2015    Past Surgical History  Procedure Laterality Date  . Shoulder surgery Right     Torn bicep  . Cardiac catheterization N/A 06/12/2015    Procedure: Left Heart Cath and Coronary Angiography;  Surgeon: Runell Gess, MD;  Location: [facility] INVASIVE CV LAB;  Service: Cardiovascular;  Laterality: N/A;  . Cardiac catheterization N/A 06/12/2015    Procedure: Coronary Stent Intervention;  Surgeon: Runell Gess, MD;  Location: [facility] INVASIVE CV LAB;  Service: Cardiovascular;  Laterality: N/A;    No family history on file.  Social History   Social History  . Marital Status: Unknown    Spouse Name: N/A  . Number of Children: 5  . Years of Education: N/A   Occupational History  . Not on file.   Social History Main Topics  . Smoking status: Current Every Day Smoker -- 1.50 packs/day for 30 years    Types: Cigarettes  . Smokeless tobacco: Not on file  . Alcohol Use: Not on file  . Drug Use: Not on file  . Sexual Activity: Not on file   Other Topics  Concern  . Not on file   Social History Narrative   Married.  Five children total between him and his wife.  He works for [employer] in [employer].      Prior to Admission medications   Medication Sig Start Date End Date Taking? Authorizing Provider  acetaminophen (TYLENOL) 325 MG tablet Take 2 tablets (650 mg total) by mouth every 4 (four) hours as needed for headache or mild pain. 06/14/15  Yes Leone Brand, NP  aspirin 81 MG chewable tablet Chew 1 tablet  (81 mg total) by mouth daily. 06/14/15  Yes Leone Brand, NP  atorvastatin (LIPITOR) 10 MG tablet Take 1 tablet (10 mg total) by mouth daily. 06/14/15  Yes Leone Brand, NP  carvedilol (COREG) 3.125 MG tablet Take 1 tablet (3.125 mg total) by mouth 2 (two) times daily with a meal. 06/14/15  Yes Leone Brand, NP  furosemide (LASIX) 40 MG tablet TAKE ONE TABLET DAILY  WHEN TAKING POTASSIUM ONLY  AS NEEDED FOR FLUID RETENTION 06/22/15  Yes Dwana Melena, PA-C  HYDROcodone-acetaminophen (NORCO/VICODIN) 5-325 MG per tablet Take 1 tablet by mouth every 6 (six) hours as needed for moderate pain.   Yes Historical Provider, MD  lisinopril (PRINIVIL,ZESTRIL) 2.5 MG tablet Take 1 tablet (2.5 mg total) by mouth daily. Patient taking differently: Take 2.5 mg by mouth at bedtime.  06/14/15  Yes Leone Brand, NP  nitroGLYCERIN (NITROSTAT) 0.4 MG SL tablet Place 1 tablet (0.4 mg total) under the tongue every 5 (five) minutes as needed for chest pain. 06/14/15  Yes Leone Brand, NP  potassium chloride (K-DUR) 10 MEQ tablet TAKE ONE TABLET DAILY WHEN TAKING LASIX ONLY AS NEEDED  FOR FLUID RETENTION 06/22/15  Yes Dwana Melena, PA-C  ticagrelor (BRILINTA) 90 MG TABS tablet Take 1 tablet (90 mg total) by mouth 2 (two) times daily. 06/14/15  Yes Leone Brand, NP  torsemide (DEMADEX) 20 MG tablet Take 1 tablet (20 mg total) by mouth 2 (two) times daily. 06/26/15   Nishant Dhungel, MD    Current Facility-Administered Medications  Medication Dose Route Frequency Provider Last Rate Last Dose  . 0.9 %  sodium chloride infusion  250 mL Intravenous PRN Hillary Bow, DO      . 0.9 %  sodium chloride infusion   Intravenous Continuous Othella Boyer, MD      . acetaminophen (TYLENOL) tablet 650 mg  650 mg Oral Q4H PRN Hillary Bow, DO      . aspirin chewable tablet 81 mg  81 mg Oral Daily Hillary Bow, DO   81 mg at 06/26/15 1039  . atorvastatin (LIPITOR) tablet 10 mg  10 mg Oral Daily Hillary Bow, DO   10 mg  at 06/26/15 1039  . carvedilol (COREG) tablet 3.125 mg  3.125 mg Oral BID WC Hillary Bow, DO   3.125 mg at 06/26/15 2130  . HYDROcodone-acetaminophen (NORCO/VICODIN) 5-325 MG per tablet 1 tablet  1 tablet Oral Q6H PRN Hillary Bow, DO      . lisinopril (PRINIVIL,ZESTRIL) tablet 2.5 mg  2.5 mg Oral QHS Hillary Bow, DO   2.5 mg at 06/25/15 2200  . ondansetron (ZOFRAN) injection 4 mg  4 mg Intravenous Q6H PRN Hillary Bow, DO      . perflutren lipid microspheres (DEFINITY) IV suspension  1-10 mL Intravenous PRN Othella Boyer, MD      . sodium chloride 0.9 % injection  3 mL  3 mL Intravenous Q12H Hillary Bow, DO   3 mL at 06/26/15 1040  . sodium chloride 0.9 % injection 3 mL  3 mL Intravenous PRN Hillary Bow, DO        No Known Allergies    Review of Systems:  As per HPI.  Remainder non-contributory.    Physical Exam:   BP 95/64 mmHg  Pulse 95  Temp(Src) 97.9 F (36.6 C) (Oral)  Resp 18  Ht 6\' 1"  (1.854 m)  Wt 86.637 kg (191 lb)  BMI 25.20 kg/m2  SpO2 97%  General:  Comfortable but tachypneic at baseline  HEENT:  Unremarkable   Neck:   + JVD, no bruits, no adenopathy   Chest:   clear to auscultation, symmetrical breath sounds but diminished at bases, no wheezes, no rhonchi   CV:   Muffled heart sounds, RRR, no murmur   Abdomen:  soft, non-tender, no masses   Extremities:  warm, well-perfused, pulses diminished, + bilateral lower extremity edema  Rectal/GU  Deferred  Neuro:   Grossly non-focal and symmetrical throughout  Skin:   Clean and dry, no rashes, no breakdown  Diagnostic Tests:  CHEST - 2 VIEW  COMPARISON: 06/25/2015  FINDINGS: Cardiac shadow is stable from the film earlier in the day. Small bilateral pleural effusions are again noted. No significant vascular congestion is noted. Persistent density is noted in the medial aspect of the right lung apex. This may be related to a confluence of vascular parenchymal shadows although the  possibility focal infiltrate cannot be excluded.  IMPRESSION: Stable appearance of the chest when compared with the prior exam.   Electronically Signed  By: Alcide Clever M.D.  On: 06/25/2015 14:13   CT ANGIOGRAPHY CHEST WITH CONTRAST  TECHNIQUE: Multidetector CT imaging of the chest was performed using the standard protocol during bolus administration of intravenous contrast. Multiplanar CT image reconstructions and MIPs were obtained to evaluate the vascular anatomy.  CONTRAST: OMNIPAQUE IOHEXOL 350 MG/ML SOLN  COMPARISON: Prior chest radiograph  FINDINGS: Angiographic study: No evidence of a pulmonary embolus. Great vessels are normal in caliber.  Thoracic inlet: No mass or adenopathy. Visualized thyroid is unremarkable.  Mediastinum and hila: Heart normal in size. There is a moderate-sized pericardial effusion there are moderate coronary artery calcifications. Several mildly prominent mediastinal lymph nodes are noted, the largest a right peritracheal, azygos level node measuring 12 mm in short axis, and a right subcarinal lymph node measuring 12 mm in short axis. There is also increased soft tissue along the hilar without a discrete enlarged lymph node.  Lungs and pleural: Moderate right and small left pleural effusions. There is dependent lower lobe atelectasis, right greater than left. Mild interstitial thickening is noted and there are changes of mild centrilobular and paraseptal emphysema in the upper lobes. No focal consolidation is seen to suggest pneumonia. No pneumothorax.  Limited upper abdomen: Trace amount of ascites adjacent to the liver.  Musculoskeletal: No osteoblastic or osteolytic lesions.  Review of the MIP images confirms the above findings.  IMPRESSION: 1. No evidence of a pulmonary embolism. 2. Moderate pericardial effusion. There are also moderate right and small left pleural effusions and a trace amount of  ascites. 3. Mild interstitial thickening. This may be chronic. Mild interstitial edema is possible. There is no evidence of airspace edema or pneumonia. 4. Lower lobe atelectasis adjacent to the pleural effusions, right greater than left. 5. Moderate coronary artery calcifications. 6. Mild emphysema.   Electronically Signed  By: Amie Portland M.D.  On: 06/25/2015 18:05    Transthoracic Echocardiography  Patient:  Sun, Wilensky MR #:    [id number] Study Date: 06/26/2015 Gender:   M Age:    54 Height:   185.4 cm Weight:   5.4 kg BSA:    0.48 m^2 Pt. Status: Room:    [address]  [place] [id number] ADMITTING  Hillary Bow PERFORMING  [facility], Inpatient ORDERING   Ellwood Handler Madelyn Brunner SONOGRAPHER Leta Jungling, RDCS  cc:  ------------------------------------------------------------------- LV EF: 35% -  40%  ------------------------------------------------------------------- Indications:   CHF - 428.0.  ------------------------------------------------------------------- History:  PMH:  Coronary artery disease. PMH:  Myocardial infarction. Risk factors: Current tobacco use. Dyslipidemia.  ------------------------------------------------------------------- Study Conclusions  - Left ventricle: The cavity size was normal. Systolic function was moderately reduced. The estimated ejection fraction was in the range of 35% to 40%. Severe hypokinesis of the anterolateral and apical myocardium. - Aortic valve: There was mild regurgitation. - Pulmonary arteries: Systolic pressure was mildly increased. PA peak pressure: 35 mm Hg (S). - Pericardium, extracardiac: A large pericardial effusion was identified circumferential to the heart. There was right atrial chamber collapse. Features were consistent with moderate tamponade physiology.  Transthoracic echocardiography.  M-mode, limited 2D, limited spectral Doppler, and color Doppler. Birthdate: Patient birthdate: [birth year]. Age: Patient is 54 yr old. Sex: Gender: male.  BMI: 1.6 kg/m^2. Blood pressure:   95/64 Patient status: Inpatient. Study date: Study date: 06/26/2015. Study time: 02:15 PM. Location: Bedside.  -------------------------------------------------------------------  ------------------------------------------------------------------- Left ventricle: The cavity size was normal. Systolic function was moderately reduced. The estimated ejection fraction was in the range of 35% to 40%. Regional wall motion abnormalities:  Severe hypokinesis of the anterolateral and apical myocardium.  ------------------------------------------------------------------- Aortic valve:  Trileaflet; normal thickness leaflets. Mobility was not restricted. Doppler: Transvalvular velocity was within the normal range. There was no stenosis. There was mild regurgitation.  ------------------------------------------------------------------- Aorta: Aortic root: The aortic root was normal in size.  ------------------------------------------------------------------- Mitral valve:  Structurally normal valve.  Mobility was not restricted. Doppler: Transvalvular velocity was within the normal range. There was no evidence for stenosis. There was no regurgitation.  ------------------------------------------------------------------- Left atrium: The atrium was normal in size.  ------------------------------------------------------------------- Right ventricle: The cavity size was normal. Wall thickness was normal. Systolic function was normal.  ------------------------------------------------------------------- Pulmonic valve:  Not well visualized. Doppler: Transvalvular velocity was within the normal range. There was no evidence  for stenosis.  ------------------------------------------------------------------- Tricuspid valve:  Structurally normal valve.  Doppler: Transvalvular velocity was within the normal range. There was mild regurgitation.  ------------------------------------------------------------------- Pulmonary artery:  The main pulmonary artery was normal-sized. Systolic pressure was mildly increased.  ------------------------------------------------------------------- Right atrium: The atrium was normal in size.  ------------------------------------------------------------------- Pericardium: A large pericardial effusion was identified circumferential to the heart. Doppler: There was right atrial chamber collapse. Features were consistent with moderate tamponade physiology.  ------------------------------------------------------------------- Systemic veins: Inferior vena cava: The vessel was dilated. Respirophasic changes in dimension were absent.  ------------------------------------------------------------------- Measurements  Left ventricle              Value    Reference LV ID, ED, PLAX chordal         43  mm   43 - 52 LV ID, ES, PLAX chordal         31  mm   23 - 38 LV fx shortening, PLAX chordal  (L)   28  %   >=29 LV PW thickness, ED  10  mm   --------- IVS/LV PW ratio, ED           1      <=1.3 LV ejection fraction, 1-p A4C      36  %   --------- LV end-diastolic volume, 2-p       65  ml   --------- LV end-systolic volume, 2-p       39  ml   --------- LV ejection fraction, 2-p        40  %   --------- Stroke volume, 2-p            26  ml   --------- LV end-diastolic volume/bsa, 2-p     135  ml/m^2 --------- LV end-systolic volume/bsa, 2-p     81  ml/m^2 --------- Stroke volume/bsa, 2-p           54.5 ml/m^2 --------- LV e&', lateral              8.38 cm/s  --------- LV e&', medial              7.94 cm/s  --------- LV e&', average              8.16 cm/s  ---------  Ventricular septum            Value    Reference IVS thickness, ED            10  mm   ---------  LVOT                   Value    Reference LVOT ID, S                21  mm   --------- LVOT area                3.46 cm^2  ---------  Aorta                  Value    Reference Aortic root ID, ED            26  mm   ---------  Left atrium               Value    Reference LA ID, A-P, ES              30  mm   --------- LA ID/bsa, A-P          (H)   6.29 cm/m^2 <=2.2 LA volume, S               33.8 ml   --------- LA volume/bsa, S             70.8 ml/m^2 --------- LA volume, ES, 1-p A4C          29.1 ml   --------- LA volume/bsa, ES, 1-p A4C        61  ml/m^2 --------- LA volume, ES, 1-p A2C          37.8 ml   --------- LA volume/bsa, ES, 1-p A2C        79.2 ml/m^2 ---------  Mitral valve               Value    Reference Mitral deceleration time         194  ms   150 - 230 Mitral E/A ratio, peak          1.3     ---------  Pulmonary  arteries            Value    Reference PA pressure, S, DP        (H)   35  mm Hg <=30  Tricuspid valve             Value    Reference Tricuspid regurg peak velocity      223  cm/s  --------- Tricuspid peak RV-RA gradient      20  mm Hg ---------  Systemic veins              Value    Reference Estimated CVP              15  mm  Hg ---------  Right ventricle             Value    Reference RV pressure, S, DP        (H)   35  mm Hg <=30  Legend: (L) and (H) mark values outside specified reference range.  ------------------------------------------------------------------- Prepared and Electronically Authenticated by  Georga Hacking, MD [facility] 2016-08-27T16:01:07   Impression:  Patient has a moderate to large pericardial effusion with pericardial tamponade having previously presented with an acute anterior wall STEMI for which he underwent PCI and stenting of the left anterior descending coronary artery on 06/12/2015.  The patient has been on dual anti-platelet therapy including aspirin and Brilinta.  I have personally reviewed the patient's transthoracic echocardiogram performed earlier today as well as previous diagnostic cardiac catheterization and echocardiogram performed during the patient's prior hospital admission. The patient has an obvious large pericardial effusion with signs of pericardial tamponade. The effusion appears to be free flowing and without other complicating features. There remain signs of hypokinesis involving the distal anterior wall and apex of the left ventricle, but overall left ventricular systolic function appears somewhat improved and there are no findings to suggest other complications such as postinfarction rupture of the left ventricle.   Plan:  I have discussed options at length with the patient at the bedside including continued observation versus pericardiocentesis performed in the cath lab versus surgical intervention for definitive drainage of the patient's pericardial effusion. We plan to proceed directly to the operating room for emergency subxiphoid pericardial window.  The patient understands and accepts all potential associated risks of surgery including but not limited to risk of death, stroke, myocardial infarction, congestive heart failure,  respiratory failure, renal failure, bleeding requiring blood transfusion and/or conversion to full median sternotomy, arrhythmia, pneumonia, pleural effusion, wound infection, pulmonary embolus or other thromboembolic complication, chronic pain or other delayed complications.  All questions answered.   I spent in excess of 90 minutes during the conduct of this hospital consultation and >50% of this time involved direct face-to-face encounter for counseling and/or coordination of the patient's care.   Salvatore Decent. Cornelius Moras, MD 06/26/2015 4:21 PM

## 2015-06-26 NOTE — Progress Notes (Signed)
Pt not receptive to CHF teaching/ video at this time. CHF packet placed in room. Pt wants to be discharged in the morning. Will continue to monitor.

## 2015-06-26 NOTE — Op Note (Signed)
CARDIOTHORACIC SURGERY OPERATIVE NOTE  Date of Procedure:   06/26/2015  Preoperative Diagnosis:  Pericardial Effusion with Pericardial Tamponade  Postoperative Diagnosis:  same  Procedure:    Subxyphoid Pericardial Window  Surgeon:    Salvatore Decent. Cornelius Moras, MD  Assistant:    Ronn Melena, CRNFA  Anesthesia:    Germaine Pomfret, MD  Operative Findings:   Moderate-large free flowing pericardial effusion with pericardial tamponade  Dark serosanguinous pericardial fluid with no signs of acute blood loss  Improved LV systolic function with moderate hypokinesis of distal anterior wall  Moderate (3+) mitral regurgitation  Transient bleeding from oropharynx immediately following placement of TEE probe with small laceration of posterior oropharynx noted on direct examination at end of procedure w/ no active bleeding     BRIEF CLINICAL NOTE AND INDICATIONS FOR SURGERY  Patient is a 54 year old male with no previous cardiac history who presented 06/12/2015 with an acute anterior wall ST segment elevation myocardial infarction. He was taken directly to the cardiac cath lab by Dr. Allyson Sabal where he was found to have acute occlusion of the mid left anterior descending coronary artery. He underwent PCI and stenting of the LAD using a drug-eluting stent. The procedure was uncomplicated. Peak troponin was greater than 65. Echocardiogram performed revealed ejection fraction estimated 40-45% with a small pericardial effusion. He recovered uneventfully and was discharged from the hospital on aspirin, Brilinta, beta blocker, statin, and ACE inhibitor. The patient states that he did well initially, but over the last 5-7 days he has developed progressive shortness breath, orthopnea, lower extremity edema and a 20 pound weight gain. He was seen in the office by Jones Skene on 06/22/2015. He was started on oral Lasix. Symptoms progressed, prompting the patient to present to the hospital for admission  yesterday evening. An echocardiogram was performed this morning demonstrating large pericardial effusion with signs of early pericardial tamponade. Cardiothoracic surgical consultation was requested.  The patient has been seen in consultation and counseled at length regarding the indications, risks and potential benefits of surgery.  All questions have been answered, and the patient provides full informed consent for the operation as described.       DETAILS OF THE OPERATIVE PROCEDURE  The patient is brought to the operating room on the above mentioned date and placed in the supine position on the operating table. A radial arterial line and central venous catheter placed by the anesthesia team. Intravenous antibiotics are administered. Pneumatic sequential compression boots are placed on both lower extremities. General endotracheal anesthesia is induced uneventfully. A Foley catheter is placed.  Transesophageal echocardiogram is performed by Dr. Jean Rosenthal.  Immediately after placement of TEE probe there was transient bleeding from oropharynx which resolved.  TEE confirmed the presence of a moderate-large free flowing pericardial effusion with pericardial tamponade.  There was moderate mitral regurgitation.  There appeared to be primarily type I and type IIIB mitral valve dysfunction.  There was no mitral valve prolapse.  Both papillary muscles were intact.  The patient's anterior chest abdomen and both groins are prepared and draped in a sterile manner.  A time out procedure is performed. A small vertical incision is made in the midline beginning at the xiphoid process and extending towards the umbilicus. The incision is completed through the subcutaneous tissue and linea alba using electrocautery. The left body of rectus abdominis muscle is retracted in a cephalad direction and the subjacent pre-peritoneal fat and fascia retracted inferiorly until the anterior surface of the pericardium was exposed. A  small incision is made in the pericardium. A large amount of dark serosanguinous fluid is immediately evacuated. Portions of the fluid are trapped to be sent for routine laboratory data, culture, and cytology. A total of 550 mL of fluid is evacuated. Transesophageal echocardiogram was performed confirming evacuation of the majority of the pericardial effusion. The pericardial space is drained with a single 28 French Bard chest tube placed through a separate stab incision. The abdominal fascia is closed in the midline using interrupted #1 Vicryl suture. Subcutaneous tissues are closed in layers and the skin is closed with a running subcuticular skin closure.   The patient tolerated the procedure well.  After removal of the TEE probe the patient's oropharynx was examined.  There was a small laceration of the posterior surface of the oropharynx without active bleeding.  The patient was extubated in the operating room, and transported to the postanesthesia care unit in stable condition. Estimated blood loss was trivial. There are no intraoperative complications. All sponge instrument and needle counts are verified correct at completion the operation.     Salvatore Decent. Cornelius Moras MD 06/26/2015 6:25 PM

## 2015-06-26 NOTE — Consult Note (Signed)
Cardiologist:  Gwenlyn Found Reason for Consult: Acute on chronic systolic HF Referring Physician: Dhungel  Devin Ellis is an 54 y.o. male.  HPI:   Devin Ellis is a 54 year old married Caucasian male without prior cardiac history until recently. He does have a history of tobacco abuse. He developed substernal chest pain 06/12/15 and was brought to [facility] emergency room where an EKG showed anterolateral ST segment elevation V2 - V4 of 4 mm with 5 - 10 mm inferior ST depression-. Code STEMI was activated and he was taken to the cath lab urgently for intervention. He has 60% LCX treating medically but 100% LAD undergoing PCI and Xience DES. Pk troponin > 65.   Pt is on BB, ASA, Brilinta, statin and ACE. His EF at cath 25-35% by echo 40-45%. [name] on ACE. Also on BB. On statin, now at 10 mg- decreased due to low LDL. We recommended phase 2 cardiac rehab. Continued medical therapy for his 60% LCX stenosis. Also discussed importance of stopping tobacco. He has short burst of NSVT on early AM of 06/13/15. None noted since.  Patient was seen on Aug 23 for posthospital follow-up. He was reporting shortness of breath with activity and when laying down.  He does not weigh himself on a daily basis but on our scale is 20 pounds more than he was on the 15th of the month. I started him on 40 mg PO lasix in the office but apparently it was not working so he came to the ER with additional weight gain.  BNP was 86 the other day and 141 yesterday.  He has a moderate pericardial, moderate right pleural and mild left pleural effusion.  + LEE, orthopnea, dyspnea, abd distention.   The patient currently denies nausea, vomiting, fever, chest pain, dizziness, PND, cough, congestion, abdominal pain, hematochezia, melena.  He states that he is not going to stay here tonight.       Filed Weights   06/25/15 1337 06/25/15 2139 06/26/15 0554  Weight: 193 lb 6.4 oz (87.726 kg) 197 lb 1.6 oz (89.404 kg)  191 lb (86.637 kg)     Past Medical History  Diagnosis Date  . Tobacco abuse   . Hyperlipidemia LDL goal <70 06/14/2015  . S/P angioplasty with stent, to LAD with Xience DES 06/12/15  06/14/2015  . ST elevation (STEMI) myocardial infarction involving left anterior descending coronary artery 06/12/2015  . Cardiomyopathy, ischemic, EF by Echo 40-45% 06/14/2015  . CAD in native artery, residual 60 LCX 06/14/2015    Past Surgical History  Procedure Laterality Date  . Shoulder surgery Right     Torn bicep  . Cardiac catheterization N/A 06/12/2015    Procedure: Left Heart Cath and Coronary Angiography;  Surgeon: Lorretta Harp, MD;  Location: [place] [facility];  Service: Cardiovascular;  Laterality: N/A;  . Cardiac catheterization N/A 06/12/2015    Procedure: Coronary Stent Intervention;  Surgeon: Lorretta Harp, MD;  Location: [place] [facility];  Service: Cardiovascular;  Laterality: N/A;    No family history on file.  Social History:  reports that he has been smoking Cigarettes.  He has a 45 pack-year smoking history. He does not have any smokeless tobacco history on file. His alcohol and drug histories are not on file.  Allergies: No Known Allergies  Medications:  Scheduled Meds: . aspirin  81 mg Oral Daily  . atorvastatin  10 mg Oral Daily  . carvedilol  3.125 mg Oral BID WC  .  heparin  5,000 Units Subcutaneous 3 times per day  . lisinopril  2.5 mg Oral QHS  . sodium chloride  3 mL Intravenous Q12H  . ticagrelor  90 mg Oral BID  . torsemide  20 mg Oral Daily   Continuous Infusions:  PRN Meds:.sodium chloride, acetaminophen, HYDROcodone-acetaminophen, ondansetron (ZOFRAN) IV, sodium chloride   Results for orders placed or performed during the hospital encounter of 06/25/15 (from the past 48 hour(s))  I-stat troponin, ED     Status: None   Collection Time: 06/25/15  1:51 PM  Result Value Ref Range   Troponin i, poc 0.05 0.00 - 0.08 ng/mL   Comment 3            Comment:  Due to the release kinetics of cTnI, a negative result within the first hours of the onset of symptoms does not rule out myocardial infarction with certainty. If myocardial infarction is still suspected, repeat the test at appropriate intervals.   Basic metabolic panel     Status: Abnormal   Collection Time: 06/25/15  2:01 PM  Result Value Ref Range   Sodium 130 (L) 135 - 145 mmol/L   Potassium 4.0 3.5 - 5.1 mmol/L   Chloride 95 (L) 101 - 111 mmol/L   CO2 27 22 - 32 mmol/L   Glucose, Bld 144 (H) 65 - 99 mg/dL   BUN 12 6 - 20 mg/dL   Creatinine, Ser 0.74 0.61 - 1.24 mg/dL   Calcium 8.4 (L) 8.9 - 10.3 mg/dL   GFR calc non Af Amer >60 >60 mL/min   GFR calc Af Amer >60 >60 mL/min    Comment: (NOTE) The eGFR has been calculated using the CKD EPI equation. This calculation has not been validated in all clinical situations. eGFR's persistently <60 mL/min signify possible Chronic Kidney Disease.    Anion gap 8 5 - 15  CBC     Status: Abnormal   Collection Time: 06/25/15  2:01 PM  Result Value Ref Range   WBC 12.6 (H) 4.0 - 10.5 K/uL   RBC 3.38 (L) 4.22 - 5.81 MIL/uL   Hemoglobin 11.6 (L) 13.0 - 17.0 g/dL   HCT 33.9 (L) 39.0 - 52.0 %   MCV 100.3 (H) 78.0 - 100.0 fL   MCH 34.3 (H) 26.0 - 34.0 pg   MCHC 34.2 30.0 - 36.0 g/dL   RDW 13.7 11.5 - 15.5 %   Platelets 284 150 - 400 K/uL  Brain natriuretic peptide     Status: Abnormal   Collection Time: 06/25/15  3:38 PM  Result Value Ref Range   B Natriuretic Peptide 141.5 (H) 0.0 - 100.0 pg/mL  D-dimer, quantitative (not at [facility])     Status: Abnormal   Collection Time: 06/25/15  3:39 PM  Result Value Ref Range   D-Dimer, Quant 3.09 (H) 0.00 - 0.48 ug/mL-FEU    Comment:        AT THE INHOUSE ESTABLISHED CUTOFF VALUE OF 0.48 ug/mL FEU, THIS ASSAY HAS BEEN DOCUMENTED IN THE LITERATURE TO HAVE A SENSITIVITY AND NEGATIVE PREDICTIVE VALUE OF AT LEAST 98 TO 99%.  THE TEST RESULT SHOULD BE CORRELATED WITH AN ASSESSMENT OF THE  CLINICAL PROBABILITY OF DVT / VTE.   Basic metabolic panel     Status: Abnormal   Collection Time: 06/26/15  3:00 AM  Result Value Ref Range   Sodium 129 (L) 135 - 145 mmol/L   Potassium 3.4 (L) 3.5 - 5.1 mmol/L   Chloride 92 (L) 101 -  111 mmol/L   CO2 26 22 - 32 mmol/L   Glucose, Bld 87 65 - 99 mg/dL   BUN 15 6 - 20 mg/dL   Creatinine, Ser 0.74 0.61 - 1.24 mg/dL   Calcium 8.4 (L) 8.9 - 10.3 mg/dL   GFR calc non Af Amer >60 >60 mL/min   GFR calc Af Amer >60 >60 mL/min    Comment: (NOTE) The eGFR has been calculated using the CKD EPI equation. This calculation has not been validated in all clinical situations. eGFR's persistently <60 mL/min signify possible Chronic Kidney Disease.    Anion gap 11 5 - 15    Dg Chest 2 View  06/25/2015   CLINICAL DATA:  Chest pain and shortness of breath with increased leg swelling  EXAM: CHEST - 2 VIEW  COMPARISON:  06/25/2015  FINDINGS: Cardiac shadow is stable from the film earlier in the day. Small bilateral pleural effusions are again noted. No significant vascular congestion is noted. Persistent density is noted in the medial aspect of the right lung apex. This may be related to a confluence of vascular parenchymal shadows although the possibility focal infiltrate cannot be excluded.  IMPRESSION: Stable appearance of the chest when compared with the prior exam.   Electronically Signed   By: Inez Catalina M.D.   On: 06/25/2015 14:13   Dg Chest 2 View  06/25/2015   CLINICAL DATA:  Acute heart failure  EXAM: CHEST  2 VIEW  COMPARISON:  None.  FINDINGS: Cardiomediastinal silhouette is unremarkable. Bilateral small pleural effusion. There is right base medially infiltrate highly suspicious for pneumonia. Streaky atelectasis or infiltrate right upper lobe paratracheal. No convincing pulmonary edema.  IMPRESSION: Bilateral small pleural effusion. There is right base medially infiltrate highly suspicious for pneumonia. Streaky atelectasis or infiltrate right  upper lobe paratracheal. No convincing pulmonary edema.   Electronically Signed   By: Lahoma Crocker M.D.   On: 06/25/2015 09:48   Ct Angio Chest Pe W/cm &/or Wo Cm  06/25/2015   CLINICAL DATA:  Positive D-dimer.  Pt reports increased leg swelling and SOB. Pt reports this has been increasing even with Lasix use. Pt denies chest pain.  EXAM: CT ANGIOGRAPHY CHEST WITH CONTRAST  TECHNIQUE: Multidetector CT imaging of the chest was performed using the standard protocol during bolus administration of intravenous contrast. Multiplanar CT image reconstructions and MIPs were obtained to evaluate the vascular anatomy.  CONTRAST:  118mL OMNIPAQUE IOHEXOL 350 MG/ML SOLN  COMPARISON:  Prior chest radiograph  FINDINGS: Angiographic study: No evidence of a pulmonary embolus. Great vessels are normal in caliber.  Thoracic inlet: No mass or adenopathy. Visualized thyroid is unremarkable.  Mediastinum and hila: Heart normal in size. There is a moderate-sized pericardial effusion there are moderate coronary artery calcifications. Several mildly prominent mediastinal lymph nodes are noted, the largest a right peritracheal, azygos level node measuring 12 mm in short axis, and a right subcarinal lymph node measuring 12 mm in short axis. There is also increased soft tissue along the hilar without a discrete enlarged lymph node.  Lungs and pleural: Moderate right and small left pleural effusions. There is dependent lower lobe atelectasis, right greater than left. Mild interstitial thickening is noted and there are changes of mild centrilobular and paraseptal emphysema in the upper lobes. No focal consolidation is seen to suggest pneumonia. No pneumothorax.  Limited upper abdomen: Trace amount of ascites adjacent to the liver.  Musculoskeletal:  No osteoblastic or osteolytic lesions.  Review of the MIP images confirms  the above findings.  IMPRESSION: 1. No evidence of a pulmonary embolism. 2. Moderate pericardial effusion. There are also  moderate right and small left pleural effusions and a trace amount of ascites. 3. Mild interstitial thickening. This may be chronic. Mild interstitial edema is possible. There is no evidence of airspace edema or pneumonia. 4. Lower lobe atelectasis adjacent to the pleural effusions, right greater than left. 5. Moderate coronary artery calcifications. 6. Mild emphysema.   Electronically Signed   By: Lajean Manes M.D.   On: 06/25/2015 18:05    Review of Systems  All other systems reviewed and are negative. The patient currently denies nausea, vomiting, fever, chest pain, dizziness, cough, congestion, abdominal pain, hematochezia, melena, claudication.  + LEE, orthopnea, dyspnea, abd distention.   Blood pressure 102/74, pulse 72, temperature 97.9 F (36.6 C), temperature source Oral, resp. rate 18, height $RemoveBe'6\' 1"'XazyPnJvW$  (1.854 m), weight 191 lb (86.637 kg), SpO2 95 %. Physical Exam  Nursing note and vitals reviewed. Constitutional: [name] oriented to person, place, and time. He appears well-developed and well-nourished. No distress.  HENT:  Head: Normocephalic.  Eyes: Pupils are equal, round, and reactive to light. No scleral icterus.  Neck: Neck supple. JVD present.  Cardiovascular: Normal rate, regular rhythm, S1 normal and S2 normal.   No murmur heard. Pulses:      Radial pulses are 2+ on the right side, and 2+ on the left side.       Dorsalis pedis pulses are 2+ on the right side, and 2+ on the left side.  Respiratory: Effort normal. He has no wheezes. He has rales.  Decreased BS > on the right  GI: Bowel sounds are normal. He exhibits distension. There is no tenderness.  Musculoskeletal: He exhibits edema.  2+ LEE   Neurological: [name] alert and oriented to person, place, and time. He exhibits normal muscle tone.  Skin: Skin is warm and dry.  Psychiatric: He has a normal mood and affect.    Assessment/Plan: Active Problems:   Acute on chronic combined systolic and diastolic heart  failure   ISCM, 40-45%   CAD-S/P angioplasty with stent, to LAD with Xience DES 06/12/15   Pericardial Effusion, Moderate   R>L pleural effusion   Hypokalemia    The patient is >20 pounds volume overloaded with R>L pleural effusion and a moderated pericardial effusion which was small on 8/14.  He was given two doses of IV lasix with -1.1L out.  He was then changed to torsemide $RemoveBefo'20mg'amcTiXEHDGd$  Daily.  I am going to increase it to BID.    He easily needs 3-4 days of diuresis but plans to leave AMA.  I strongly advised against it.  Replaced K.   BP is soft. On 2.5 lisinopril.   Shannan Garfinkel, [place], [place] 06/26/2015, 11:11 AM

## 2015-06-27 ENCOUNTER — Inpatient Hospital Stay (HOSPITAL_COMMUNITY): Payer: Medicaid Other

## 2015-06-27 LAB — BASIC METABOLIC PANEL
ANION GAP: 9 (ref 5–15)
BUN: 14 mg/dL (ref 6–20)
CO2: 27 mmol/L (ref 22–32)
Calcium: 8 mg/dL — ABNORMAL LOW (ref 8.9–10.3)
Chloride: 96 mmol/L — ABNORMAL LOW (ref 101–111)
Creatinine, Ser: 0.66 mg/dL (ref 0.61–1.24)
GFR calc Af Amer: >60 mL/min (ref >60)
GLUCOSE: 107 mg/dL — AB (ref 65–99)
POTASSIUM: 4.1 mmol/L (ref 3.5–5.1)
Sodium: 132 mmol/L — ABNORMAL LOW (ref 135–145)

## 2015-06-27 LAB — CBC
HEMATOCRIT: 32.7 % — AB (ref 39.0–52.0)
HEMOGLOBIN: 11.1 g/dL — AB (ref 13.0–17.0)
MCH: 34.2 pg — ABNORMAL HIGH (ref 26.0–34.0)
MCHC: 33.9 g/dL (ref 30.0–36.0)
MCV: 100.6 fL — AB (ref 78.0–100.0)
PLATELETS: 259 10*3/uL (ref 150–400)
RBC: 3.25 MIL/uL — AB (ref 4.22–5.81)
RDW: 13.8 % (ref 11.5–15.5)
WBC: 14.8 10*3/uL — AB (ref 4.0–10.5)

## 2015-06-27 LAB — POCT I-STAT 3, ART BLOOD GAS (G3+)
Acid-Base Excess: 3 mmol/L — ABNORMAL HIGH (ref 0.0–2.0)
Bicarbonate: 27.1 mEq/L — ABNORMAL HIGH (ref 20.0–24.0)
O2 Saturation: 95 %
PCO2 ART: 38.4 mmHg (ref 35.0–45.0)
PH ART: 7.457 — AB (ref 7.350–7.450)
Patient temperature: 99
TCO2: 28 mmol/L (ref 0–100)
pO2, Arterial: 74 mmHg — ABNORMAL LOW (ref 80.0–100.0)

## 2015-06-27 MED ORDER — CLOPIDOGREL BISULFATE 75 MG PO TABS
75.0000 mg | ORAL_TABLET | Freq: Every day | ORAL | Status: DC
  Administered 2015-06-27 – 2015-06-29 (×3): 75 mg via ORAL
  Filled 2015-06-27 (×5): qty 1

## 2015-06-27 MED ORDER — OXYCODONE-ACETAMINOPHEN 5-325 MG PO TABS: 1.0000 | ORAL_TABLET | ORAL | Status: DC | PRN

## 2015-06-27 MED ORDER — TRAMADOL HCL 50 MG PO TABS
50.0000 mg | ORAL_TABLET | Freq: Four times a day (QID) | ORAL | Status: DC | PRN
  Administered 2015-06-27: 50 mg via ORAL
  Filled 2015-06-27: qty 1

## 2015-06-27 NOTE — Progress Notes (Signed)
Subjective:  Says breathing much better.  Not SOB.  Usual soreness, no severe pain  Objective:  Vital Signs in the last 24 hours: BP 93/65 mmHg  Pulse 84  Temp(Src) 98.4 F (36.9 C) (Oral)  Resp 15  Ht 6\' 1"  (1.854 m)  Wt 87.3 kg (192 lb 7.4 oz)  BMI 25.40 kg/m2  SpO2 95%  Physical Exam: Pleasant WM in NAD Lungs:  Reduced BS bases Cardiac:  Regular rhythm, normal S1 and S2, no S3, no rub heard Extremities:  1+ edema present  Intake/Output from previous day: 08/27 0701 - 08/28 0700 In: 2855 [P.O.:680; I.V.:2125; IV Piggyback:50] Out: 2500 [Urine:2280; Chest Tube:220] Weight Filed Weights   06/25/15 2139 06/26/15 0554 06/27/15 0500  Weight: 89.404 kg (197 lb 1.6 oz) 86.637 kg (191 lb) 87.3 kg (192 lb 7.4 oz)    Lab Results: Basic Metabolic Panel:  Recent Labs  06/29/15 0300 06/26/15 1937 06/27/15 0340  NA 129*  --  132*  K 3.4*  --  4.1  CL 92*  --  96*  CO2 26  --  27  GLUCOSE 87 112* 107*  BUN 15  --  14  CREATININE 0.74  --  0.66    CBC:  Recent Labs  06/26/15 1628 06/27/15 0340  WBC 12.5* 14.8*  HGB 12.0* 11.1*  HCT 35.8* 32.7*  MCV 101.4* 100.6*  PLT 300 259    BNP    Component Value Date/Time   BNP 141.5* 06/25/2015 1538    PROTIME: Lab Results  Component Value Date   INR 1.33 06/26/2015   INR 1.08 06/12/2015    Telemetry: Sinus rhythm  Assessment/Plan:  1. Pericardial tamponade resolved 2. Recent anterior MI treated with DES  Rec:  Would place back on Plavix as soon as feasible instead of Brilanta.  Restart past MI meds.  Appreciate Dr. 06/14/2015 help.      Orvan July  MD [facility]  06/27/2015, 7:53 AM

## 2015-06-27 NOTE — Progress Notes (Signed)
   06/27/15 1820  Mobility  Activity Ambulate in hall  Level of Assistance Standby assist, set-up cues, supervision of patient - no hands on  Assistive Device Front wheel walker  Distance Ambulated (ft) 350 ft  Ambulation Response Tolerated well  Pt ambulated with 2L of O2. Back in chair, call bell in reach, chest tube returned to suction.

## 2015-06-27 NOTE — Progress Notes (Addendum)
      [address]       [place] [address]             [phone]        1 Day Post-Op Procedure(s) (LRB): SUBXYPHOID PERICARDIAL WINDOW (N/A)  Subjective: Patient sitting in chair. States his breathing is better.  Objective: Vital signs in last 24 hours: Temp:  [97.6 F (36.4 C)-99.1 F (37.3 C)] 98.1 F (36.7 C) (08/28 0755) Pulse Rate:  [80-100] 82 (08/28 0805) Cardiac Rhythm:  [-] Normal sinus rhythm (08/28 0800) Resp:  [11-23] 14 (08/28 0805) BP: (93-131)/(54-82) 101/62 mmHg (08/28 0805) SpO2:  [89 %-100 %] 96 % (08/28 0805) Arterial Line BP: (109-158)/(47-71) 116/59 mmHg (08/28 0805) Weight:  [192 lb 7.4 oz (87.3 kg)] 192 lb 7.4 oz (87.3 kg) (08/28 0500)   Current Weight  06/27/15 192 lb 7.4 oz (87.3 kg)      Intake/Output from previous day: 08/27 0701 - 08/28 0700 In: 2855 [P.O.:680; I.V.:2125; IV Piggyback:50] Out: 2575 [Urine:2355; Chest Tube:220]   Physical Exam:  Cardiovascular: RRR Pulmonary: Slightly diminished at bases; no rales, wheezes, or rhonchi. Abdomen: Soft, non tender, bowel sounds present. Extremities: Trace bilateral lower extremity edema. Wounds: Dressing is clean and dry.   Lab Results: CBC: Recent Labs  06/26/15 1628 06/27/15 0340  WBC 12.5* 14.8*  HGB 12.0* 11.1*  HCT 35.8* 32.7*  PLT 300 259   BMET:  Recent Labs  06/26/15 0300 06/26/15 1937 06/27/15 0340  NA 129*  --  132*  K 3.4*  --  4.1  CL 92*  --  96*  CO2 26  --  27  GLUCOSE 87 112* 107*  BUN 15  --  14  CREATININE 0.74  --  0.66  CALCIUM 8.4*  --  8.0*    PT/INR:  Lab Results  Component Value Date   INR 1.33 06/26/2015   INR 1.08 06/12/2015   ABG:  INR: Will add last result for INR, ABG once components are confirmed Will add last 4 CBG results once components are confirmed  Assessment/Plan:  1. CV - Recent MI with DES. Per Dr. Donnie Aho, restart Plavix (instead of Brilinta) today as discussed with Dr. Cornelius Moras. On Coreg 3.125 mg  bid and Lisinopril 2.5 mg daily. 2.  Pulmonary - Chest tubes with 220 cc of output since surgery. On 2 liters of oxygen via [place]. CXR shows no pneumothorax, bibasilar atelectasis, small pleural effusions. Encouarge incentive spirometer. 3.  Acute blood loss anemia - H and H stable at 11.1 and 32.7 4. Remove a line and foley and heplock IV 5. Transfer to PCTU  ZIMMERMAN,DONIELLE MPA-C 06/27/2015,9:42 AM  I have seen and examined the patient and agree with the assessment and plan as outlined.  Will start Plavix instead of Brilinta for DAPT.  Keep chest tube in for now.  Transfer stepdown.  Remainder of care per Cardiology team  Purcell Nails 06/27/2015 9:57 AM

## 2015-06-28 ENCOUNTER — Inpatient Hospital Stay (HOSPITAL_COMMUNITY): Payer: Medicaid Other

## 2015-06-28 ENCOUNTER — Encounter (HOSPITAL_COMMUNITY): Payer: Self-pay | Admitting: Thoracic Surgery (Cardiothoracic Vascular Surgery)

## 2015-06-28 DIAGNOSIS — I314 Cardiac tamponade: Secondary | ICD-10-CM

## 2015-06-28 LAB — CBC
HEMATOCRIT: 37.1 % — AB (ref 39.0–52.0)
Hemoglobin: 12.5 g/dL — ABNORMAL LOW (ref 13.0–17.0)
MCH: 33.9 pg (ref 26.0–34.0)
MCHC: 33.7 g/dL (ref 30.0–36.0)
MCV: 100.5 fL — AB (ref 78.0–100.0)
PLATELETS: 318 10*3/uL (ref 150–400)
RBC: 3.69 MIL/uL — AB (ref 4.22–5.81)
RDW: 13.8 % (ref 11.5–15.5)
WBC: 11.7 10*3/uL — ABNORMAL HIGH (ref 4.0–10.5)

## 2015-06-28 LAB — COMPREHENSIVE METABOLIC PANEL
ALK PHOS: 116 U/L (ref 38–126)
ALT: 51 U/L (ref 17–63)
AST: 20 U/L (ref 15–41)
Albumin: 2.4 g/dL — ABNORMAL LOW (ref 3.5–5.0)
Anion gap: 7 (ref 5–15)
BILIRUBIN TOTAL: 0.5 mg/dL (ref 0.3–1.2)
BUN: 11 mg/dL (ref 6–20)
CALCIUM: 8.1 mg/dL — AB (ref 8.9–10.3)
CHLORIDE: 95 mmol/L — AB (ref 101–111)
CO2: 29 mmol/L (ref 22–32)
CREATININE: 0.72 mg/dL (ref 0.61–1.24)
Glucose, Bld: 127 mg/dL — ABNORMAL HIGH (ref 65–99)
Potassium: 3.9 mmol/L (ref 3.5–5.1)
Sodium: 131 mmol/L — ABNORMAL LOW (ref 135–145)
TOTAL PROTEIN: 5.9 g/dL — AB (ref 6.5–8.1)

## 2015-06-28 MED ORDER — SODIUM CHLORIDE 0.9 % IJ SOLN: 10.0000 mL | INTRAMUSCULAR | Status: DC | PRN

## 2015-06-28 MED ORDER — FUROSEMIDE 10 MG/ML IJ SOLN
40.0000 mg | Freq: Two times a day (BID) | INTRAMUSCULAR | Status: DC
  Administered 2015-06-28 (×2): 40 mg via INTRAVENOUS
  Filled 2015-06-28 (×5): qty 4

## 2015-06-28 NOTE — Discharge Summary (Signed)
[address]       [place] [address]             [phone]              Discharge Summary  Name: Joshia Kitchings DOB: [birth year] 54 y.o. MRN: [id number]   Admission Date: 06/25/2015 Discharge Date:     Admitting Diagnosis: Acute on chronic combined systolic and diastolic heart failure Moderate pericardial effusion   Discharge Diagnosis:  Principal Problem:   Pericardial tamponade Active Problems:   Tobacco abuse   S/P angioplasty with stent, to LAD with Xience DES 06/12/15    Ischemic cardiomyopathy   CAD (coronary artery disease), native coronary artery   Acute on chronic combined systolic and diastolic heart failure   Pericardial effusion  Past Medical History  Diagnosis Date  . Tobacco abuse   . Hyperlipidemia LDL goal <70 06/14/2015  . S/P angioplasty with stent, to LAD with Xience DES 06/12/15  06/14/2015  . ST elevation (STEMI) myocardial infarction involving left anterior descending coronary artery 06/12/2015  . Cardiomyopathy, ischemic, EF by Echo 40-45% 06/14/2015  . CAD in native artery, residual 60 LCX 06/14/2015  . Pericardial effusion 06/26/2015  . Pericardial tamponade 06/26/2015      Procedures: SUBXYPHOID PERICARDIAL WINDOW  - 06/26/2015    HPI:  The patient is a 54 y.o. male with no previous cardiac history who presented 06/12/2015 with an acute anterior wall ST segment elevation myocardial infarction. He was taken directly to the cardiac cath lab by Dr. Allyson Sabal where he was found to have acute occlusion of the mid left anterior descending coronary artery. He underwent PCI and stenting of the LAD using a drug-eluting stent. The procedure was uncomplicated. Peak troponin was greater than 65. Echocardiogram performed revealed ejection fraction estimated 40-45% with a small pericardial effusion. He recovered uneventfully and was discharged from the hospital on aspirin, Brilinta, beta blocker, statin, and ACE inhibitor. The  patient states that he did well initially, but over the last 5-7 days he has developed progressive shortness breath, orthopnea, lower extremity edema and a 20 pound weight gain. He was seen in the office by Jones Skene on 06/22/2015. He was started on oral Lasix. Symptoms progressed, prompting the patient to present to the hospital for evaluation.  BNP had increased from 86 to 141, with an additional weight gain noted on exam. CT angiogram ruled out a pulmonary embolus, but did show a moderate pericardial effusion.  He was admitted for diuresis and further evaluation.    Hospital Course:  The patient was admitted to [facility] on 06/25/2015. He was aggressively diuresed.  An echocardiogram was performed which revealed an EF 35-40% with hypokinesis of the distal anterior wall and apex. There was a large pericardial effusion with tamponade. Right ventricular function was normal with early collapse.  It was felt that pericardiocentesis was not recommended as the patient was on Brilinta, therefore, a cardiac surgery consult was requested for consideration of pericardial window. Dr. Cornelius Moras saw the patient and reviewed his studies.   All options were discussed at length with the patient at the bedside, including continued observation versus pericardiocentesis performed in the cath lab versus surgical intervention for definitive drainage of the patient's pericardial effusion. All risks and benefits of surgery were explained in detail, and the patient agreed to proceed.   The patient was taken to the operating room and underwent the above procedure.  Intraoperatively, around 550 ml sanguinous fluid was drained  from the pericardial space. TEE showed the effusion to be completely drained with improved LV function, severe MR and no evidence of tamponade. The patient was transferred to the ICU for postoperative management.  The postoperative course has generally been uneventful. The patient remained stable and was able  to be transferred from the ICU to the floor on postop day 1.  Per cardiology's recommendations, the patient was started on aspirin and Plavix instead of Brilinta for his recent drug eluting stent. Drainage from the chest tube remained minimal and it was discontinued on postop day 2. Incisions are healing well. The patient is ambulating in the halls without difficulty and is tolerating a regular diet.  He continues to diurese well. Vital signs have remained stable.  Overall, he is progressing well and we anticipate discharge home in the next 24-48 hours provided no acute changes occur.     Recent vital signs:  Filed Vitals:   06/28/15 1318  BP: 106/76  Pulse: 83  Temp: 98.8 F (37.1 C)  Resp: 18    Recent laboratory studies:  CBC: Recent Labs  06/27/15 0340 06/28/15 0308  WBC 14.8* 11.7*  HGB 11.1* 12.5*  HCT 32.7* 37.1*  PLT 259 318   BMET:  Recent Labs  06/27/15 0340 06/28/15 0308  NA 132* 131*  K 4.1 3.9  CL 96* 95*  CO2 27 29  GLUCOSE 107* 127*  BUN 14 11  CREATININE 0.66 0.72  CALCIUM 8.0* 8.1*    PT/INR:  Recent Labs  06/26/15 1628  LABPROT 16.6*  INR 1.33      Medication List    STOP taking these medications        potassium chloride 10 MEQ tablet  Commonly known as:  K-DUR     ticagrelor 90 MG Tabs tablet  Commonly known as:  BRILINTA      TAKE these medications        acetaminophen 325 MG tablet  Commonly known as:  TYLENOL  Take 2 tablets (650 mg total) by mouth every 4 (four) hours as needed for headache or mild pain.     aspirin 81 MG chewable tablet  Chew 1 tablet (81 mg total) by mouth daily.     atorvastatin 10 MG tablet  Commonly known as:  LIPITOR  Take 1 tablet (10 mg total) by mouth daily.     carvedilol 3.125 MG tablet  Commonly known as:  COREG  Take 1 tablet (3.125 mg total) by mouth 2 (two) times daily with a meal.     clopidogrel 75 MG tablet  Commonly known as:  PLAVIX  Take 1 tablet (75 mg total) by mouth daily.       furosemide 40 MG tablet  Commonly known as:  LASIX  Take 1 tablet (40 mg total) by mouth 2 (two) times daily.     HYDROcodone-acetaminophen 5-325 MG per tablet  Commonly known as:  NORCO/VICODIN  Take 1 tablet by mouth every 6 (six) hours as needed for moderate pain.     lisinopril 2.5 MG tablet  Commonly known as:  PRINIVIL,ZESTRIL  Take 1 tablet (2.5 mg total) by mouth daily.     nitroGLYCERIN 0.4 MG SL tablet  Commonly known as:  NITROSTAT  Place 1 tablet (0.4 mg total) under the tongue every 5 (five) minutes as needed for chest pain.     potassium chloride SA 20 MEQ tablet  Commonly known as:  K-DUR,KLOR-CON  Take 1 tablet (20 mEq total) by mouth 2 (  two) times daily.     traMADol 50 MG tablet  Commonly known as:  ULTRAM  Take 1 tablet (50 mg total) by mouth every 6 (six) hours as needed for moderate pain.         Discharge Instructions:  The patient is to refrain from driving, heavy lifting or strenuous activity.  May shower daily and clean incisions with soap and water.  May resume regular diet.  Follow-up Information    Follow up with [facility] C., PA-C. Schedule an appointment as soon as possible for a visit in 1 week.   Specialty:  Physician Assistant   Contact information:   [address] [place] [address] [phone]       Follow up with Purcell Nails, MD On 07/26/2015.   Specialty:  Cardiothoracic Surgery   Why:  Have a chest x-ray at [facility] 10:00   Contact information:   [address] [place] [address] [phone]       Follow up with [facility] RN On 07/06/2015.   Why:  For suture removal at 10:30      Follow up with HAGER, BRYAN, PA-C On 07/15/2015.   Specialties:  Physician Assistant, Radiology, Interventional Cardiology   Why:  Appointment with Wilburt Finlay on 07/15/15 at 2:30PM. [facility] Location)   Contact information:   [address] [place] [address] [phone]           Adella Hare 06/28/2015, 1:23 PM

## 2015-06-28 NOTE — Progress Notes (Signed)
Patient Name: Devin Ellis Date of Encounter: 06/28/2015  Principal Problem:   Pericardial tamponade Active Problems:   Tobacco abuse   S/P angioplasty with stent, to LAD with Xience DES 06/12/15    Ischemic cardiomyopathy   CAD (coronary artery disease), native coronary artery   Acute on chronic combined systolic and diastolic heart failure   Pericardial effusion    Primary Cardiologist: Dr. Allyson Sabal Patient Profile: 54 yo male w/ PMH of tobacco abuse who was admitted on 06/25/15 for acute CHF exacerbation. Recently admitted on 06/12/15 for CODE STEMI, with cath showing 60% LCX and 100% LAD occlusion, undergoing PCI and Xience DES at that time.  SUBJECTIVE: Denies any chest pain or shortness of breath. Denies any pain at chest tube insertion site. Is strongly desiring to go home today.  OBJECTIVE Filed Vitals:   06/27/15 1739 06/27/15 1814 06/27/15 2100 06/28/15 0414  BP: 93/53 100/63 92/60 101/68  Pulse: 91 87 86 84  Temp:   97.9 F (36.6 C) 98.2 F (36.8 C)  TempSrc:   Oral Oral  Resp:   16 16  Height:      Weight:    194 lb (87.998 kg)  SpO2:   97% 98%    Intake/Output Summary (Last 24 hours) at 06/28/15 0729 Last data filed at 06/27/15 2107  Gross per 24 hour  Intake    496 ml  Output    955 ml  Net   -459 ml   Filed Weights   06/26/15 0554 06/27/15 0500 06/28/15 0414  Weight: 191 lb (86.637 kg) 192 lb 7.4 oz (87.3 kg) 194 lb (87.998 kg)    PHYSICAL EXAM General: Well developed, well nourished, male in no acute distress. Head: Normocephalic, atraumatic.  Neck: Supple without bruits, JVD slightly elevated. Lungs:  Resp regular and unlabored, CTA without wheezing or rales. Chest tube in place. Heart: RRR, S1, S2, no S3, S4, or murmur; no rub. Abdomen: Soft, non-tender, non-distended with normoactive bowel sounds. No hepatomegaly. No rebound/guarding. No obvious abdominal masses. Extremities: No clubbing or cyanosis. 1+ edema bilaterally. Distal pedal pulses  are 2+ bilaterally. Neuro: Alert and oriented X 3. Moves all extremities spontaneously. Psych: Normal affect.   LABS: CBC: Recent Labs  06/27/15 0340 06/28/15 0308  WBC 14.8* 11.7*  HGB 11.1* 12.5*  HCT 32.7* 37.1*  MCV 100.6* 100.5*  PLT 259 318   INR: Recent Labs  06/26/15 1628  INR 1.33   Basic Metabolic Panel: Recent Labs  06/27/15 0340 06/28/15 0308  NA 132* 131*  K 4.1 3.9  CL 96* 95*  CO2 27 29  GLUCOSE 107* 127*  BUN 14 11  CREATININE 0.66 0.72  CALCIUM 8.0* 8.1*   Liver Function Tests: Recent Labs  06/26/15 1937 06/28/15 0308  AST  --  20  ALT  --  51  ALKPHOS  --  116  BILITOT  --  0.5  PROT 5.6* 5.9*  ALBUMIN  --  2.4*    Recent Labs  06/25/15 1351  TROPIPOC 0.05   BNP:  B NATRIURETIC PEPTIDE  Date/Time Value Ref Range Status  06/25/2015 03:38 PM 141.5* 0.0 - 100.0 pg/mL Final   PRO B NATRIURETIC PEPTIDE (BNP)  Date/Time Value Ref Range Status  06/22/2015 09:34 AM 86.0 0.0 - 100.0 pg/mL Final   D-dimer: Recent Labs  06/25/15 1539  DDIMER 3.09*    TELE:  Sinus rhythm with rate in 70's - 90's.       ECHO: 06/26/15 Study Conclusions -  Left ventricle: The cavity size was normal. Systolic function was moderately reduced. The estimated ejection fraction was in the range of 35% to 40%. Severe hypokinesis of the anterolateral and apical myocardium. - Aortic valve: There was mild regurgitation. - Pulmonary arteries: Systolic pressure was mildly increased. PA peak pressure: 35 mm Hg (S). - Pericardium, extracardiac: A large pericardial effusion was identified circumferential to the heart. There was right atrial chamber collapse. Features were consistent with moderate tamponade physiology.  TEE ECHO:  06/26/15  Impressions: - The pericardial effusion has been evacuated. LV function improvement from pre-window images. Tamponade is no longer evident. Severe mitral regurgitation.  Radiology/Studies: Dg Chest  Port 1 View: 06/27/2015   CLINICAL DATA:  Evaluate chest tube.  Tobacco abuse.  EXAM: PORTABLE CHEST - 1 VIEW  COMPARISON:  1 day prior  FINDINGS: Right internal jugular line tip at low SVC. Midline trachea. Cardiomegaly accentuated by AP portable technique. Suspect layering bilateral pleural effusions. No pneumothorax. Improvement in mild interstitial edema. Persistent bibasilar airspace disease.  IMPRESSION: Improved congestive heart failure.  Similar bibasilar atelectasis with probable layering bilateral pleural effusions.   Electronically Signed   By: Jeronimo Greaves M.D.   On: 06/27/2015 08:50   Dg Chest Port 1 View:06/26/2015   CLINICAL DATA:  CHF.  Left chest tube, postop pericardial window.  EXAM: PORTABLE CHEST - 1 VIEW  COMPARISON:  06/25/2015  FINDINGS: Drainage catheter projects over the left heart. Diffuse interstitial and alveolar opacities are noted compatible with edema. Heart is borderline in size. Probable small layering effusions, right greater than left.  IMPRESSION: Mild interstitial and alveolar opacities, likely mild edema/CHF. Small layering effusions.   Electronically Signed   By: Charlett Nose M.D.   On: 06/26/2015 19:17     Current Medications:  . acetaminophen  1,000 mg Oral 4 times per day   Or  . acetaminophen (TYLENOL) oral liquid 160 mg/5 mL  1,000 mg Oral 4 times per day  . aspirin  81 mg Oral Daily  . atorvastatin  10 mg Oral Daily  . bisacodyl  10 mg Oral Daily  . carvedilol  3.125 mg Oral BID WC  . clopidogrel  75 mg Oral Daily  . lisinopril  2.5 mg Oral QHS  . senna-docusate  1 tablet Oral QHS   . dextrose 5 % and 0.45 % NaCl with KCl 20 mEq/L 10 mL/hr at 06/27/15 0954    ASSESSMENT AND PLAN:  1. Acute on Chronic Systolic and Diastolic Heart Failure - Presented to the ED on 06/25/15 for worsening shortness of breath, lower extremity edema, and a 20 pound weight gain in the past 5-7 days. - Echo on 06/26/15 showed EF of 35-40%. - Has been on IV Lasix off/on since  admission, but was discontinued 06/26/15. Patient reports he is "intolerant to Lasix due to it not helping his edema". Net output since admission has only been -1.3L. However, patient has only received 100mg  of IV Lasix this admission, and on the day he received Lasix 80mg  his net output was -1.1L . BP has been soft, so we will resume IV Lasix at 40mg  BID today.  - Weight on admission was 197lbs, still 194lbs on 06/28/15. Weight was 170lbs on 06/12/15. Still appears volume overloaded on exam.  - continue ACE and BB  2. CAD - s/p angioplasty with stent to LAD treated with Xience DES on 06/12/15. - Was on Brilinta previously. Started on Plavix instead of Brilinta on 06/27/15. - continue ASA, ACE-I, BB and  Statin  3. Pericardial Effusion with pericardial tamponade - following consult with Dr. Cornelius Moras on 06/26/15, he went to the OR for emergent subxiphoid pericardial window. Total of of fluid was evacuated. - as of 06/27/15, chest tube showed 300cc of output since surgery.    Signed, Wandra Mannan , PA-C 7:29 AM 06/28/2015  Agree with above assessment. Patient remains hemodynamically stable following his emergent subxiphoid pericardial window for pericardial tamponade. 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 Now back on plavix rather than brilinta.  Still very volume overloaded with peripheral edema. Lasix being restarted.Should be ready for discharge in am.

## 2015-06-28 NOTE — Discharge Instructions (Signed)
Please call the office [phone]) if you experience increased chest pain, shortness of breath, fever greater than 101, or worsening lower extremity swelling. You may shower daily and clean incisions with soap and water. No tub baths until sutures are out. Walk daily and continue breathing exercises. No driving, heavy lifting or strenuous activity for the next week.

## 2015-06-28 NOTE — Progress Notes (Signed)
Utilization review completed.  

## 2015-06-28 NOTE — Progress Notes (Signed)
      [address]       [place] [address]             [phone]     CARDIOTHORACIC SURGERY PROGRESS NOTE  2 Days Post-Op  S/P Procedure(s) (LRB): SUBXYPHOID PERICARDIAL WINDOW (N/A)  Subjective: Feels well.  Denies pain.  No SOB.  Wants to go home  Objective: Vital signs in last 24 hours: Temp:  [97.8 F (36.6 C)-98.3 F (36.8 C)] 98.2 F (36.8 C) (08/29 0414) Pulse Rate:  [79-103] 103 (08/29 0804) Cardiac Rhythm:  [-] Normal sinus rhythm (08/29 0802) Resp:  [14-27] 16 (08/29 0414) BP: (81-111)/(34-70) 105/57 mmHg (08/29 0804) SpO2:  [93 %-98 %] 98 % (08/29 0414) Arterial Line BP: (124-126)/(61-63) 126/61 mmHg (08/28 1000) Weight:  [87.998 kg (194 lb)] 87.998 kg (194 lb) (08/29 0414)  Physical Exam:  Rhythm:   sinus  Breath sounds: clear  Heart sounds:  RRR  Incisions:  Dressing dry and intact  Abdomen:  Soft, non-distended, non-tender  Extremities:  Warm, well-perfused  Chest tubes:  Trivial thin serosanguinous output, no air leak    Intake/Output from previous day: 08/28 0701 - 08/29 0700 In: 496 [P.O.:240; I.V.:206; IV Piggyback:50] Out: 955 [Urine:935; Chest Tube:20] Intake/Output this shift:    Lab Results:  Recent Labs  06/27/15 0340 06/28/15 0308  WBC 14.8* 11.7*  HGB 11.1* 12.5*  HCT 32.7* 37.1*  PLT 259 318   BMET:  Recent Labs  06/27/15 0340 06/28/15 0308  NA 132* 131*  K 4.1 3.9  CL 96* 95*  CO2 27 29  GLUCOSE 107* 127*  BUN 14 11  CREATININE 0.66 0.72  CALCIUM 8.0* 8.1*    CBG (last 3)  No results for input(s): GLUCAP in the last 72 hours. PT/INR:   Recent Labs  06/26/15 1628  LABPROT 16.6*  INR 1.33    CXR:  PORTABLE CHEST - 1 VIEW  COMPARISON: 06/27/2015  FINDINGS: Right jugular central venous catheter remains in appropriate position. No pneumothorax visualized.  Heart size is stable. Layering bilateral pleural effusions are seen with bibasilar atelectasis. Mild interstitial prominence is  stable and consistent with mild interstitial edema.  IMPRESSION: Mild diffuse interstitial edema, small bilateral pleural effusions, and bibasilar atelectasis, without significant change.   Electronically Signed  By: Myles Rosenthal M.D.  On: 06/28/2015 07:49   Assessment/Plan: S/P Procedure(s) (LRB): SUBXYPHOID PERICARDIAL WINDOW (N/A)  Doing well D/C chest tube today Continue ASA and Plavix and all other medications per Cardiology Team Possible d/c home tomorrow   Purcell Nails 06/28/2015 8:54 AM

## 2015-06-29 ENCOUNTER — Ambulatory Visit: Payer: Medicaid Other | Admitting: Physician Assistant

## 2015-06-29 ENCOUNTER — Other Ambulatory Visit: Payer: Medicaid Other

## 2015-06-29 ENCOUNTER — Other Ambulatory Visit: Payer: Self-pay | Admitting: Student

## 2015-06-29 ENCOUNTER — Telehealth: Payer: Self-pay | Admitting: Cardiology

## 2015-06-29 LAB — BASIC METABOLIC PANEL
ANION GAP: 6 (ref 5–15)
BUN: 8 mg/dL (ref 6–20)
CALCIUM: 8.3 mg/dL — AB (ref 8.9–10.3)
CO2: 29 mmol/L (ref 22–32)
Chloride: 101 mmol/L (ref 101–111)
Creatinine, Ser: 0.59 mg/dL — ABNORMAL LOW (ref 0.61–1.24)
Glucose, Bld: 102 mg/dL — ABNORMAL HIGH (ref 65–99)
POTASSIUM: 3.6 mmol/L (ref 3.5–5.1)
Sodium: 136 mmol/L (ref 135–145)

## 2015-06-29 MED ORDER — FUROSEMIDE 40 MG PO TABS
40.0000 mg | ORAL_TABLET | Freq: Two times a day (BID) | ORAL | Status: DC
  Administered 2015-06-29: 40 mg via ORAL
  Filled 2015-06-29: qty 1

## 2015-06-29 MED ORDER — TRAMADOL HCL 50 MG PO TABS: 50.0000 mg | ORAL_TABLET | Freq: Four times a day (QID) | ORAL | Status: DC | PRN

## 2015-06-29 MED ORDER — CLOPIDOGREL BISULFATE 75 MG PO TABS: 75.0000 mg | ORAL_TABLET | Freq: Every day | ORAL | Status: DC

## 2015-06-29 MED ORDER — POTASSIUM CHLORIDE CRYS ER 20 MEQ PO TBCR
20.0000 meq | EXTENDED_RELEASE_TABLET | Freq: Two times a day (BID) | ORAL | Status: DC
  Administered 2015-06-29: 20 meq via ORAL
  Filled 2015-06-29: qty 1

## 2015-06-29 MED ORDER — POTASSIUM CHLORIDE CRYS ER 20 MEQ PO TBCR: 20.0000 meq | EXTENDED_RELEASE_TABLET | Freq: Two times a day (BID) | ORAL | Status: DC

## 2015-06-29 MED ORDER — FUROSEMIDE 40 MG PO TABS: 40.0000 mg | ORAL_TABLET | Freq: Two times a day (BID) | ORAL | Status: DC

## 2015-06-29 NOTE — Progress Notes (Signed)
Patient Name: Devin Ellis Date of Encounter: 06/29/2015  Principal Problem:   Pericardial tamponade Active Problems:   Tobacco abuse   S/P angioplasty with stent, to LAD with Xience DES 06/12/15    Ischemic cardiomyopathy   CAD (coronary artery disease), native coronary artery   Acute on chronic combined systolic and diastolic heart failure   Pericardial effusion     Primary Cardiologist: Dr. Allyson Sabal Patient Profile: 54 yo male w/ PMH of tobacco abuse who was admitted on 06/25/15 for acute CHF exacerbation. Recently admitted on 06/12/15 for CODE STEMI, with cath showing 60% LCX and 100% LAD occlusion, undergoing PCI and Xience DES at that time.  SUBJECTIVE: Feeling well. Says his legs do not feel as tight as they did previously. Denies any chest pain, palpitations, or shortness of breath.  OBJECTIVE Filed Vitals:   06/28/15 1318 06/28/15 2056 06/28/15 2134 06/29/15 0457  BP: 106/76 97/80 110/63 110/75  Pulse: 83 92 88 98  Temp: 98.8 F (37.1 C) 98.5 F (36.9 C)  98 F (36.7 C)  TempSrc: Oral Oral  Oral  Resp: 18 18  18   Height:      Weight:      SpO2: 94% 95%  95%    Intake/Output Summary (Last 24 hours) at 06/29/15 0743 Last data filed at 06/29/15 07/01/15  Gross per 24 hour  Intake   1310 ml  Output   3725 ml  Net  -2415 ml   Filed Weights   06/26/15 0554 06/27/15 0500 06/28/15 0414  Weight: 191 lb (86.637 kg) 192 lb 7.4 oz (87.3 kg) 194 lb (87.998 kg)    PHYSICAL EXAM General: Well developed, well nourished, male in no acute distress. Head: Normocephalic, atraumatic.  Neck: Supple without bruits, JVD not elevated. Lungs:  Resp regular and unlabored, CTA without wheezing or rales. Heart: RRR, S1, S2, no S3, S4, or murmur; no rub. Abdomen: Soft, non-tender, non-distended with normoactive bowel sounds. No hepatomegaly. No rebound/guarding. No obvious abdominal masses. Extremities: No clubbing or cyanosis. 1+ edema. Distal pedal pulses are 2+ bilaterally. Neuro:  Alert and oriented X 3. Moves all extremities spontaneously. Psych: Normal affect.  LABS: CBC: Recent Labs  06/27/15 0340 06/28/15 0308  WBC 14.8* 11.7*  HGB 11.1* 12.5*  HCT 32.7* 37.1*  MCV 100.6* 100.5*  PLT 259 318   INR: Recent Labs  06/26/15 1628  INR 1.33   Basic Metabolic Panel: Recent Labs  06/28/15 0308 06/29/15 0440  NA 131* 136  K 3.9 3.6  CL 95* 101  CO2 29 29  GLUCOSE 127* 102*  BUN 11 8  CREATININE 0.72 0.59*  CALCIUM 8.1* 8.3*   Liver Function Tests: Recent Labs  06/26/15 1937 06/28/15 0308  AST  --  20  ALT  --  51  ALKPHOS  --  116  BILITOT  --  0.5  PROT 5.6* 5.9*  ALBUMIN  --  2.4*   BNP:  B NATRIURETIC PEPTIDE  Date/Time Value Ref Range Status  06/25/2015 03:38 PM 141.5* 0.0 - 100.0 pg/mL Final   PRO B NATRIURETIC PEPTIDE (BNP)  Date/Time Value Ref Range Status  06/22/2015 09:34 AM 86.0 0.0 - 100.0 pg/mL Final    TELE:  Sinus rhythm with rate in 80's - 90's. No atopic events.       Radiology/Studies: Dg Chest Port 1 View: 06/28/2015   CLINICAL DATA:  Myocardial infarct. Ischemic cardiomyopathy. Congestive heart failure.  EXAM: PORTABLE CHEST - 1 VIEW  COMPARISON:  06/27/2015  FINDINGS: Right jugular central venous catheter remains in appropriate position. No pneumothorax visualized.  Heart size is stable. Layering bilateral pleural effusions are seen with bibasilar atelectasis. Mild interstitial prominence is stable and consistent with mild interstitial edema.  IMPRESSION: Mild diffuse interstitial edema, small bilateral pleural effusions, and bibasilar atelectasis, without significant change.   Electronically Signed   By: Myles Rosenthal M.D.   On: 06/28/2015 07:49     Current Medications:  . acetaminophen  1,000 mg Oral 4 times per day   Or  . acetaminophen (TYLENOL) oral liquid 160 mg/5 mL  1,000 mg Oral 4 times per day  . aspirin  81 mg Oral Daily  . atorvastatin  10 mg Oral Daily  . bisacodyl  10 mg Oral Daily  . carvedilol   3.125 mg Oral BID WC  . clopidogrel  75 mg Oral Daily  . furosemide  40 mg Intravenous BID  . lisinopril  2.5 mg Oral QHS  . senna-docusate  1 tablet Oral QHS      ASSESSMENT AND PLAN: 1. Acute on Chronic Systolic and Diastolic Heart Failure - Presented to the ED on 06/25/15 for worsening shortness of breath, lower extremity edema, and a 20 pound weight gain in the past 5-7 days. - Echo on 06/26/15 showed EF of 35-40%. - Has been on IV Lasix off/on since admission, but was discontinued 06/26/15. Patient reports he is "intolerant to Lasix due to it not helping his edema". Net output since admission has only been -1.3L. However, patient has only received 100mg  of IV Lasix this admission, and on the day he received Lasix 80mg  his net output was -1.1L . Restarted Lasix 40mg  IV BID on 06/28/15 and had net output of 2.4L. Was on Lasix 40mg  daily PRN at home, which patient says he was taking daily over the past several weeks. Will need to resume Lasix and potassium supplement at home dose when discharged. - Weight on admission was 197lbs, still 194lbs on 06/28/15. Weight was 170lbs on 06/12/15.  - continue ACE and BB  2. CAD - s/p angioplasty with stent to LAD treated with Xience DES on 06/12/15. - Was on Brilinta previously. Started on Plavix instead of Brilinta on 06/27/15. Will need to remain on Plavix at discharge. - continue ASA, ACE-I, BB and Statin  3. Pericardial Effusion with pericardial tamponade - following consult with Dr. 06/30/15 on 06/26/15, he went to the OR for emergent subxiphoid pericardial window. Total of 06/14/15 of fluid was evacuated. - as of 06/27/15, chest tube showed 300cc of output since surgery. Chest tube removed on 06/28/15 without any complications.  Follow-up with [facility] scheduled on 07/15/15 at 2:30PM with 06/29/15, PA-C.  06/30/15 , PA-C 7:43 AM 06/29/2015 Pager: [phone] Patient feels well today.  Good diuresis yesterday on IV lasix. His  breathing is much better. Tolerating activity.Remains in NSR. Exam reveals decreased breath sounds both bases consistent with pleural effusions. Heart reveals no pericardial rub, no gallop. Will switch to oral lasix 40 mg BID and add KCl. K+3.6 today. OK for discharge today on above lasix and potassium. He should be seen back in our  Office in about a week for transition of care visit and get a BMET then.

## 2015-06-29 NOTE — Progress Notes (Signed)
Reviewed discharged materials with patient and family. IV removed. No issues at present. Patient awaiting wheelchair to be discharged.   Devin Ellis, Charlaine Dalton RN

## 2015-06-29 NOTE — Telephone Encounter (Signed)
New message      TCM appt on 07-07-15 with [place] per Thayer Ohm

## 2015-06-29 NOTE — Progress Notes (Addendum)
[address]       [place] [address]             [phone]      3 Days Post-Op Procedure(s) (LRB): SUBXYPHOID PERICARDIAL WINDOW (N/A) Subjective: Feels well, no c/o  Objective: Vital signs in last 24 hours: Temp:  [98 F (36.7 C)-98.8 F (37.1 C)] 98 F (36.7 C) (08/30 0457) Pulse Rate:  [83-103] 98 (08/30 0457) Cardiac Rhythm:  [-] Normal sinus rhythm (08/29 1940) Resp:  [18] 18 (08/30 0457) BP: (97-110)/(57-80) 110/75 mmHg (08/30 0457) SpO2:  [94 %-95 %] 95 % (08/30 0457)  Hemodynamic parameters for last 24 hours:    Intake/Output from previous day: 08/29 0701 - 08/30 0700 In: 1310 [P.O.:1310] Out: 3725 [Urine:3725] Intake/Output this shift:    General appearance: alert, cooperative and no distress Heart: regular rate and rhythm, no rub and no gallop Lungs: min dim in bases Abdomen: benign Extremities: + bilat LE edema Wound: incis healing well  Lab Results:  Recent Labs  06/27/15 0340 06/28/15 0308  WBC 14.8* 11.7*  HGB 11.1* 12.5*  HCT 32.7* 37.1*  PLT 259 318   BMET:  Recent Labs  06/28/15 0308 06/29/15 0440  NA 131* 136  K 3.9 3.6  CL 95* 101  CO2 29 29  GLUCOSE 127* 102*  BUN 11 8  CREATININE 0.72 0.59*  CALCIUM 8.1* 8.3*    PT/INR:  Recent Labs  06/26/15 1628  LABPROT 16.6*  INR 1.33   ABG    Component Value Date/Time   PHART 7.457* 06/27/2015 0342   HCO3 27.1* 06/27/2015 0342   TCO2 28 06/27/2015 0342   O2SAT 95.0 06/27/2015 0342   CBG (last 3)  No results for input(s): GLUCAP in the last 72 hours.  Meds Scheduled Meds: . acetaminophen  1,000 mg Oral 4 times per day   Or  . acetaminophen (TYLENOL) oral liquid 160 mg/5 mL  1,000 mg Oral 4 times per day  . aspirin  81 mg Oral Daily  . atorvastatin  10 mg Oral Daily  . bisacodyl  10 mg Oral Daily  . carvedilol  3.125 mg Oral BID WC  . clopidogrel  75 mg Oral Daily  . furosemide  40 mg Intravenous BID  . lisinopril  2.5 mg Oral QHS  .  senna-docusate  1 tablet Oral QHS   Continuous Infusions:  PRN Meds:.acetaminophen, morphine injection, ondansetron (ZOFRAN) IV, oxyCODONE-acetaminophen, sodium chloride, traMADol  Xrays Dg Chest Port 1 View  06/28/2015   CLINICAL DATA:  Myocardial infarct. Ischemic cardiomyopathy. Congestive heart failure.  EXAM: PORTABLE CHEST - 1 VIEW  COMPARISON:  06/27/2015  FINDINGS: Right jugular central venous catheter remains in appropriate position. No pneumothorax visualized.  Heart size is stable. Layering bilateral pleural effusions are seen with bibasilar atelectasis. Mild interstitial prominence is stable and consistent with mild interstitial edema.  IMPRESSION: Mild diffuse interstitial edema, small bilateral pleural effusions, and bibasilar atelectasis, without significant change.   Electronically Signed   By: Myles Rosenthal M.D.   On: 06/28/2015 07:49    Assessment/Plan: S/P Procedure(s) (LRB): SUBXYPHOID PERICARDIAL WINDOW (N/A)  1 doing well from surgical perspective 2 conts with sig volume overload- currently getting IV lasix dosing 3 poss d/c if cardiology feels CHF is manageable as outpatient at this time    LOS: 4 days    GOLD,WAYNE E 06/29/2015  I have seen and examined the patient and agree with the assessment and plan as outlined.  Purcell Nails 06/29/2015 11:11 AM

## 2015-06-29 NOTE — Care Management Note (Signed)
Case Management Note  Patient Details  Name: Devin Ellis MRN: [id number] Date of Birth: 17-Dec-1960  Subjective/Objective:  Pt admitted with pericardial tamponade                  Action/Plan:  Pt is independent from home with wife.     Expected Discharge Date:                  Expected Discharge Plan:     In-House Referral:     Discharge planning Services     Post Acute Care Choice:    Choice offered to:     DME Arranged:    DME Agency:     HH Arranged:    HH Agency:     Status of Service:  Complete will sign off  Medicare Important Message Given:  no Date Medicare IM Given:    Medicare IM give by:    Date Additional Medicare IM Given:    Additional Medicare Important Message give by:     If discussed at Long Length of Stay Meetings, dates discussed:    Additional Comments: CM assessed pt, pt is ambulatory in room independent, no need for PT/OT evaluation, pt agreed.  Pt stated he has a scale and that he weighs himself daily, pt follows a low sodium diet.  Pt sees PA Hagar at [facility].  No other CM needs Cherylann Parr, RN 06/29/2015, 9:55 AM

## 2015-06-30 NOTE — Telephone Encounter (Signed)
Pt is a Dr. Allyson Sabal pt. Will forward to NL triage.

## 2015-06-30 NOTE — Telephone Encounter (Signed)
Patient contacted regarding discharge from [facility] on 06/28/15.  Patient understands to follow up with provider B. Sharol Harness, [place] on 07/07/15  at 1130am at [facility] - patient aware of location. Patient understands discharge instructions? YES Patient understands medications and regiment? YES Patient understands to bring all medications to this visit? YES

## 2015-07-01 LAB — CULTURE, BODY FLUID W GRAM STAIN -BOTTLE

## 2015-07-01 LAB — CULTURE, BODY FLUID-BOTTLE: CULTURE: NO GROWTH

## 2015-07-01 MED FILL — Perflutren Lipid Microsphere IV Susp 1.1 MG/ML: INTRAVENOUS | Qty: 10 | Status: AC

## 2015-07-06 ENCOUNTER — Ambulatory Visit (INDEPENDENT_AMBULATORY_CARE_PROVIDER_SITE_OTHER): Payer: Self-pay | Admitting: *Deleted

## 2015-07-06 DIAGNOSIS — I3139 Other pericardial effusion (noninflammatory): Secondary | ICD-10-CM

## 2015-07-06 DIAGNOSIS — I5040 Unspecified combined systolic (congestive) and diastolic (congestive) heart failure: Secondary | ICD-10-CM

## 2015-07-06 DIAGNOSIS — I319 Disease of pericardium, unspecified: Secondary | ICD-10-CM

## 2015-07-06 DIAGNOSIS — I252 Old myocardial infarction: Secondary | ICD-10-CM | POA: Insufficient documentation

## 2015-07-06 DIAGNOSIS — I313 Pericardial effusion (noninflammatory): Secondary | ICD-10-CM

## 2015-07-06 DIAGNOSIS — Z4802 Encounter for removal of sutures: Secondary | ICD-10-CM

## 2015-07-06 DIAGNOSIS — I213 ST elevation (STEMI) myocardial infarction of unspecified site: Secondary | ICD-10-CM | POA: Insufficient documentation

## 2015-07-06 NOTE — Progress Notes (Signed)
Devin Ellis returns for suture removal of one previous chest tube site.  This was easily removed.  This site as well as the vertical sub-xyphoid pericardial window incison are healing well.  He says he is breathing easily. He will return as scheduled with a cxr.  H e is using his pain meds minimally.

## 2015-07-07 ENCOUNTER — Encounter: Payer: Self-pay | Admitting: Cardiology

## 2015-07-07 ENCOUNTER — Ambulatory Visit (INDEPENDENT_AMBULATORY_CARE_PROVIDER_SITE_OTHER): Payer: Medicaid Other | Admitting: Cardiology

## 2015-07-07 VITALS — BP 100/60 | HR 97 | Ht 73.0 in | Wt 167.0 lb

## 2015-07-07 DIAGNOSIS — I5043 Acute on chronic combined systolic (congestive) and diastolic (congestive) heart failure: Secondary | ICD-10-CM | POA: Diagnosis not present

## 2015-07-07 MED ORDER — FUROSEMIDE 40 MG PO TABS: 40.0000 mg | ORAL_TABLET | ORAL | Status: DC | PRN

## 2015-07-07 NOTE — Patient Instructions (Signed)
Medication Instructions:  Your physician has recommended you make the following change in your medication:  1-Changing Lasix to as needed for weight gain of 3 pounds in a 24 hour period  Labwork: NONE  Testing/Procedures: NONE  Follow-Up: Your physician recommends that you keep your scheduled follow-up appointment with Dr. Allyson Sabal.    Any Other Special Instructions Will Be Listed Below (If Applicable).

## 2015-07-07 NOTE — Progress Notes (Signed)
07/07/2015 Devin Ellis   10-07-1961  [id number]  Primary Physician Horald Pollen., PA-C Primary Cardiologist: Dr. Allyson Sabal   Reason for Visit/CC: [facility] Follow Up for CAD and Pericardial Effusion  HPI:  The patient is a 46 year year-old male, who presents to clinic today for post hospital follow-up. He has known CAD as well as a history of tobacco abuse. He was first admitted to [facility] 06/12/2015 with an acute anterior wall STEMI. He was taken directly to the cardiac cath  lab by Dr. Allyson Sabal where he was found to have acute occlusion of the mid LAD. He underwent PCI and stenting of the LAD using a drug-eluting stent. He was placed on dual antiplatelet therapy with aspirin plus Plavix. He was also noted to have moderate left ventricular dysfunction with an ejection fraction of 35-40%. He was subsequently placed on beta blocker therapy with Coreg as well as ACE inhibitor therapy with lisinopril. He was also discharged home on PO Lasix.  He presented back to [facility] on 06/25/2015 with complaints of worsening dyspnea, orthopnea and lower extremity edema. D-dimer was elevated. CT angiogram ruled out a pulmonary embolus, but did show a moderate pericardial effusion. This  was also confirmed by echo. He was admitted and underwent subxiphoid pericardial window, performed by Dr. Cornelius Moras. His pericardial effusion resolved as well as his symptoms. He was discharged home on 06/28/2015. He now presents to clinic today for follow-up.  Since discharge, he reports that he has done well. He denies any recurrent angina. He has not required use of sublingual nitroglycerin. He denies any exertional symptoms. He also denies any recurrent dyspnea, orthopnea, PND, cough, syncope/near-syncope. No lower extremity edema or weight gain. He has been fully compliant with daily weights. [name] below his dry weight of 170 pounds. His weight today in clinic is 167 lb. He reports adherence to a low-sodium  diet. Unfortunately he continues to smoke but notes that he has cut back significantly. Prior to his MI, he was smoking 2 packs per day however he is now down to half a pack per day.    Current Outpatient Prescriptions  Medication Sig Dispense Refill  . acetaminophen (TYLENOL) 325 MG tablet Take 2 tablets (650 mg total) by mouth every 4 (four) hours as needed for headache or mild pain.    [name] aspirin 81 MG chewable tablet Chew 1 tablet (81 mg total) by mouth daily.    [name] atorvastatin (LIPITOR) 10 MG tablet Take 1 tablet (10 mg total) by mouth daily. 30 tablet 6  . carvedilol (COREG) 3.125 MG tablet Take 1 tablet (3.125 mg total) by mouth 2 (two) times daily with a meal. 60 tablet 6  . clopidogrel (PLAVIX) 75 MG tablet Take 1 tablet (75 mg total) by mouth daily. 30 tablet 1  . furosemide (LASIX) 40 MG tablet Take 1 tablet (40 mg total) by mouth 2 (two) times daily. 60 tablet 1  . HYDROcodone-acetaminophen (NORCO/VICODIN) 5-325 MG per tablet Take 1 tablet by mouth every 6 (six) hours as needed for moderate pain.    [name] lisinopril (PRINIVIL,ZESTRIL) 2.5 MG tablet Take 1 tablet (2.5 mg total) by mouth daily. (Patient taking differently: Take 2.5 mg by mouth at bedtime. ) 30 tablet 6  . nitroGLYCERIN (NITROSTAT) 0.4 MG SL tablet Place 1 tablet (0.4 mg total) under the tongue every 5 (five) minutes as needed for chest pain. 25 tablet 4  . potassium chloride SA (K-DUR,KLOR-CON) 20 MEQ tablet Take 1 tablet (20  mEq total) by mouth 2 (two) times daily. 60 tablet 1  . traMADol (ULTRAM) 50 MG tablet Take 1 tablet (50 mg total) by mouth every 6 (six) hours as needed for moderate pain. 30 tablet 0   No current facility-administered medications for this visit.    No Known Allergies  Social History   Social History  . Marital Status: Unknown    Spouse Name: N/A  . Number of Children: 5  . Years of Education: N/A   Occupational History  . Not on file.   Social History Main Topics  . Smoking status:  Current Every Day Smoker -- 1.50 packs/day for 30 years    Types: Cigarettes  . Smokeless tobacco: Not on file  . Alcohol Use: Not on file  . Drug Use: Not on file  . Sexual Activity: Not on file   Other Topics Concern  . Not on file   Social History Narrative   Married.  Five children total between him and his wife.  He works for [employer] in [employer].       Review of Systems: General: negative for chills, fever, night sweats or weight changes.  Cardiovascular: negative for chest pain, dyspnea on exertion, edema, orthopnea, palpitations, paroxysmal nocturnal dyspnea or shortness of breath Dermatological: negative for rash Respiratory: negative for cough or wheezing Urologic: negative for hematuria Abdominal: negative for nausea, vomiting, diarrhea, bright red blood per rectum, melena, or hematemesis Neurologic: negative for visual changes, syncope, or dizziness All other systems reviewed and are otherwise negative except as noted above.    Blood pressure 100/60, pulse 97, height 6\' 1"  (1.854 m), weight 167 lb (75.751 kg).  General appearance: alert, cooperative and no distress Neck: no carotid bruit and no JVD Lungs: clear to auscultation bilaterally Heart: regular rate and rhythm, S1, S2 normal, no murmur, click, rub or gallop Extremities: no LEE Pulses: 2+ and symmetric Skin: Skin color, texture, turgor normal. No rashes or lesions Neurologic: Grossly normal  EKG not performed  ASSESSMENT AND PLAN:   1. CAD: Status post recent anterior STEMI, treated with PCI plus drug-eluting stenting to the mid LAD. He denies any recurrent angina. Continue dual antiplatelet therapy with aspirin plus Plavix for a minimum of one year given newly placed drug-eluting stent. Continue statin, beta blocker and ACE inhibitor therapy. He has SL nitroglycerin at home for PRN use but has not required any therapy.   2. Pericardial effusion: Status post successful subxiphoid pericardial window  by Dr. . He denies any recurrent symptoms of dyspnea, orthopnea, PND, cough, chest pain, syncope/near-syncope. His incision site is healing well. He has an appointment scheduled to see Dr. Cornelius Moras in follow-up on 07/26/2015.  3. Chronic systolic heart failure: EF is 07/28/2015. [name] euvolemic on physical exam and denies any recent symptoms of dyspnea, orthopnea, PND or lower extremity edema. He has been fully compliant with daily weights. [name] below his dry weight of 170 pounds. Due to soft blood pressure, we'll change Lasix to as needed. Continue low sodium diet. Continue beta blocker and ACE inhibitor therapy. Due to soft blood pressure, we are unable to titrate his dose of Coreg and lisinopril at this time. His resting heart rate is slightly up today at 97 bpm. We'll reassess at his next follow-up appointment with Dr. 70-96% on 08/03/2015. If his blood pressure prohibits further titration of his beta blocker, can consider addition of Corlanor for rate control. Will defer to Dr. 10/03/2015.   4. Tobacco abuse: Smoking  cessation strongly advised.   PLAN  continue current plan of care. Consider addition of Corlanor for heart failure at next office visit (see above). Keep follow-up with Dr. Allyson Sabal 08/03/2015.  Robbie Lis PA-C 07/07/2015 11:54 AM

## 2015-07-15 ENCOUNTER — Ambulatory Visit: Payer: Medicaid Other | Admitting: Physician Assistant

## 2015-07-23 ENCOUNTER — Other Ambulatory Visit: Payer: Self-pay | Admitting: Thoracic Surgery (Cardiothoracic Vascular Surgery)

## 2015-07-23 DIAGNOSIS — I3139 Other pericardial effusion (noninflammatory): Secondary | ICD-10-CM

## 2015-07-23 DIAGNOSIS — I313 Pericardial effusion (noninflammatory): Secondary | ICD-10-CM

## 2015-07-26 ENCOUNTER — Ambulatory Visit (INDEPENDENT_AMBULATORY_CARE_PROVIDER_SITE_OTHER): Payer: Self-pay | Admitting: Thoracic Surgery (Cardiothoracic Vascular Surgery)

## 2015-07-26 ENCOUNTER — Encounter: Payer: Self-pay | Admitting: Thoracic Surgery (Cardiothoracic Vascular Surgery)

## 2015-07-26 ENCOUNTER — Ambulatory Visit
Admission: RE | Admit: 2015-07-26 | Discharge: 2015-07-26 | Disposition: A | Discharge status: Home / Self Care | Payer: Medicaid Other | Source: Ambulatory Visit | Attending: Thoracic Surgery (Cardiothoracic Vascular Surgery) | Admitting: Thoracic Surgery (Cardiothoracic Vascular Surgery)

## 2015-07-26 VITALS — BP 115/80 | HR 92 | Resp 20 | Ht 73.0 in | Wt 167.0 lb

## 2015-07-26 DIAGNOSIS — I3139 Other pericardial effusion (noninflammatory): Secondary | ICD-10-CM

## 2015-07-26 DIAGNOSIS — I313 Pericardial effusion (noninflammatory): Secondary | ICD-10-CM

## 2015-07-26 DIAGNOSIS — I319 Disease of pericardium, unspecified: Secondary | ICD-10-CM

## 2015-07-26 DIAGNOSIS — I314 Cardiac tamponade: Secondary | ICD-10-CM

## 2015-07-26 NOTE — Patient Instructions (Signed)
Continue all previous medications without any changes at this time  Stop smoking immediately and permanently.  You may continue to gradually increase your physical activity as tolerated.  Refrain from any heavy lifting or strenuous use of your arms and shoulders until at least 8 weeks from the time of your surgery, and avoid activities that cause increased pain in your chest on the side of your surgical incision.  Otherwise you may continue to increase activities without any particular limitations.  Increase the intensity and duration of physical activity gradually.

## 2015-07-26 NOTE — Progress Notes (Signed)
[address]       [place] [address]             [phone]     CARDIOTHORACIC SURGERY OFFICE NOTE  Referring Saahil Herbster is Runell Gess, MD PCP is Horald Pollen., PA-C   HPI:  Patient returns for routine follow-up status post subxiphoid pericardial window on 06/26/2015 for hemorrhagic pericardial effusion with early pericardial tamponade having previously undergone PCI and stenting of the left anterior descending coronary artery by Dr. Allyson Sabal on 06/12/2015 for an acute anterior wall ST segment elevation myocardial infarction.  The patient's postoperative recovery following pericardial window was uneventful and he was discharged home on the third postoperative day. He has been receiving dual antiplatelets therapy with aspirin and Plavix since he underwent pericardial window. He was seen in follow-up by Boyce Medici at [facility] On 07/07/2015 and he returns to our office for routine follow-up today. The patient states that he is doing very well. He has very mild residual soreness in his epigastric region related to his surgical incision. He denies any shortness of breath. He has not had any exertional chest pain or chest tightness suspicious for angina pectoris. Unfortunate, he has gone back to smoking cigarettes, although he claims he has cut back the amount significantly.   Current Outpatient Prescriptions  Medication Sig Dispense Refill  . acetaminophen (TYLENOL) 325 MG tablet Take 2 tablets (650 mg total) by mouth every 4 (four) hours as needed for headache or mild pain.    [name] aspirin 81 MG chewable tablet Chew 1 tablet (81 mg total) by mouth daily.    [name] atorvastatin (LIPITOR) 10 MG tablet Take 1 tablet (10 mg total) by mouth daily. 30 tablet 6  . carvedilol (COREG) 3.125 MG tablet Take 1 tablet (3.125 mg total) by mouth 2 (two) times daily with a meal. 60 tablet 6  . clopidogrel (PLAVIX) 75 MG tablet Take 1 tablet (75 mg total) by mouth daily. 30  tablet 1  . HYDROcodone-acetaminophen (NORCO/VICODIN) 5-325 MG per tablet Take 1 tablet by mouth every 6 (six) hours as needed for moderate pain.    [name] lisinopril (PRINIVIL,ZESTRIL) 2.5 MG tablet Take 1 tablet (2.5 mg total) by mouth daily. (Patient taking differently: Take 2.5 mg by mouth at bedtime. ) 30 tablet 6  . nitroGLYCERIN (NITROSTAT) 0.4 MG SL tablet Place 1 tablet (0.4 mg total) under the tongue every 5 (five) minutes as needed for chest pain. 25 tablet 4  . traMADol (ULTRAM) 50 MG tablet Take 1 tablet (50 mg total) by mouth every 6 (six) hours as needed for moderate pain. 30 tablet 0  . furosemide (LASIX) 40 MG tablet Take 1 tablet (40 mg total) by mouth as needed. (Patient not taking: Reported on 07/26/2015)    . potassium chloride SA (K-DUR,KLOR-CON) 20 MEQ tablet Take 1 tablet (20 mEq total) by mouth 2 (two) times daily. (Patient not taking: Reported on 07/26/2015) 60 tablet 1   No current facility-administered medications for this visit.      Physical Exam:   BP 115/80 mmHg  Pulse 92  Resp 20  Ht 6\' 1"  (1.854 m)  Wt 167 lb (75.751 kg)  BMI 22.04 kg/m2  SpO2 98%  General:  Well-appearing  Chest:   clear  CV:   Regular rate and rhythm  Incisions:  Clean and dry and healing nicely  Abdomen:  Soft and nontender  Extremities:  Warm and well perfused  Diagnostic Tests:  CHEST  2 VIEW  COMPARISON: None in PACs  FINDINGS: The lungs are mildly hyperinflated. The interstitial markings are coarse. Subtle nodularity is noted in the left upper lobe. The heart is normal in size. The pulmonary vascularity is normal. There is no pleural effusion or pneumothorax. The retrosternal soft tissues are unremarkable. The gas pattern in the upper abdomen is normal. The bony thorax exhibits no acute abnormality.  IMPRESSION: There is no acute cardiopulmonary abnormality. Prominence of the pulmonary interstitial markings with a subtle nodular pattern in the left upper lobe likely  reflects underlying interstitial lung disease. Elective noncontrast chest CT scanning is recommended.   Electronically Signed  By: David Swaziland M.D.  On: 07/26/2015 09:29   Impression:  Patient appears to have recovered uneventfully following subxiphoid pericardial window for pericardial tamponade with previous history of large anterior wall myocardial infarction treated with PCI and stenting.   Plan:  I have encouraged the patient to continue to gradually increase his physical activity as tolerated but I have cautioned him to avoid any heavy lifting or strenuous use of his arms or shoulders for at least another 8 weeks. I have strongly encouraged the patient to find a way to quit smoking and offered referral to stop smoking counselors. All the patient's questions have been addressed. In the future he will call and return to see [place] as needed.   Salvatore Decent. Cornelius Moras, MD 07/26/2015 10:15 AM

## 2015-08-03 ENCOUNTER — Encounter: Payer: Self-pay | Admitting: Cardiovascular Disease

## 2015-08-03 ENCOUNTER — Ambulatory Visit (INDEPENDENT_AMBULATORY_CARE_PROVIDER_SITE_OTHER): Payer: Medicaid Other | Admitting: Cardiovascular Disease

## 2015-08-03 VITALS — BP 120/78 | HR 85 | Ht 73.0 in | Wt 165.0 lb

## 2015-08-03 DIAGNOSIS — I5043 Acute on chronic combined systolic (congestive) and diastolic (congestive) heart failure: Secondary | ICD-10-CM

## 2015-08-03 DIAGNOSIS — E785 Hyperlipidemia, unspecified: Secondary | ICD-10-CM | POA: Diagnosis not present

## 2015-08-03 DIAGNOSIS — I314 Cardiac tamponade: Secondary | ICD-10-CM | POA: Diagnosis not present

## 2015-08-03 DIAGNOSIS — I5041 Acute combined systolic (congestive) and diastolic (congestive) heart failure: Secondary | ICD-10-CM

## 2015-08-03 NOTE — Addendum Note (Signed)
Addended by: Theressa Stamps on: 08/03/2015 02:42 PM   Modules accepted: Orders

## 2015-08-03 NOTE — Progress Notes (Signed)
08/03/2015 Devin Ellis   Sep 27, 1961  [id number]  Primary Physician Horald Pollen., PA-C Primary Cardiologist: Runell Gess MD Devin Ellis   HPI:  Devin Ellis returns today for post hospital follow-up. [name] accompanied by his wife. [name] a 54 year old thin-appearing married Caucasian male father of 5 children who works at [employer]. His primary care physician is Dr. Harland German at [facility] in [place]. He had an acute anterior wall myocardial infarction on 06/12/15 treated with stenting of his mid LAD with a drug-eluting stent in the right radial approach by myself. His EF at that time was 25-35% with anteroapical wall motion and mobility. He had a 60% AV groove circumflex stenosis. His risk factor profile is notable for 80 pack years of tobacco smoking 2 packs a day up until the point now smoking 4 cigarettes a day. 2 weeks post discharge from the hospital he was admitted with pericardial tamponade due to  hemorrhagic pericardial effusion undergoing a subxiphoid pericardial window by Dr. Cornelius Moras . Since discharge she still remarkably well. He has no shortness of breath or chest pain. I released him to go back to work   Current Outpatient Prescriptions  Medication Sig Dispense Refill  . acetaminophen (TYLENOL) 325 MG tablet Take 2 tablets (650 mg total) by mouth every 4 (four) hours as needed for headache or mild pain.    [name] aspirin 81 MG chewable tablet Chew 1 tablet (81 mg total) by mouth daily.    [name] atorvastatin (LIPITOR) 10 MG tablet Take 1 tablet (10 mg total) by mouth daily. 30 tablet 6  . carvedilol (COREG) 3.125 MG tablet Take 1 tablet (3.125 mg total) by mouth 2 (two) times daily with a meal. 60 tablet 6  . clopidogrel (PLAVIX) 75 MG tablet Take 1 tablet (75 mg total) by mouth daily. 30 tablet 1  . furosemide (LASIX) 40 MG tablet Take 1 tablet (40 mg total) by mouth as needed.    [name] HYDROcodone-acetaminophen (NORCO/VICODIN) 5-325 MG per tablet Take 1 tablet by mouth  every 6 (six) hours as needed for moderate pain.    [name] lisinopril (PRINIVIL,ZESTRIL) 2.5 MG tablet Take 1 tablet (2.5 mg total) by mouth daily. (Patient taking differently: Take 2.5 mg by mouth at bedtime. ) 30 tablet 6  . nitroGLYCERIN (NITROSTAT) 0.4 MG SL tablet Place 1 tablet (0.4 mg total) under the tongue every 5 (five) minutes as needed for chest pain. 25 tablet 4  . potassium chloride SA (K-DUR,KLOR-CON) 20 MEQ tablet Take 1 tablet (20 mEq total) by mouth 2 (two) times daily. 60 tablet 1  . traMADol (ULTRAM) 50 MG tablet Take 1 tablet (50 mg total) by mouth every 6 (six) hours as needed for moderate pain. 30 tablet 0   No current facility-administered medications for this visit.    No Known Allergies  Social History   Social History  . Marital Status: Unknown    Spouse Name: N/A  . Number of Children: 5  . Years of Education: N/A   Occupational History  . Not on file.   Social History Main Topics  . Smoking status: Current Every Day Smoker -- 1.50 packs/day for 30 years    Types: Cigarettes  . Smokeless tobacco: Not on file  . Alcohol Use: Not on file  . Drug Use: Not on file  . Sexual Activity: Not on file   Other Topics Concern  . Not on file   Social History Narrative   Married.  Five children  total between him and his wife.  He works for [employer] in [employer].       Review of Systems: General: negative for chills, fever, night sweats or weight changes.  Cardiovascular: negative for chest pain, dyspnea on exertion, edema, orthopnea, palpitations, paroxysmal nocturnal dyspnea or shortness of breath Dermatological: negative for rash Respiratory: negative for cough or wheezing Urologic: negative for hematuria Abdominal: negative for nausea, vomiting, diarrhea, bright red blood per rectum, melena, or hematemesis Neurologic: negative for visual changes, syncope, or dizziness All other systems reviewed and are otherwise negative except as noted  above.    Blood pressure 120/78, pulse 85, height 6\' 1"  (1.854 m), weight 165 lb (74.844 kg).  General appearance: alert and no distress Neck: no adenopathy, no carotid bruit, no JVD, supple, symmetrical, trachea midline and thyroid not enlarged, symmetric, no tenderness/mass/nodules Lungs: clear to auscultation bilaterally Heart: regular rate and rhythm, S1, S2 normal, no murmur, click, rub or gallop Extremities: extremities normal, atraumatic, no cyanosis or edema  EKG not performed today  ASSESSMENT AND PLAN:   Tobacco abuse History is 80-pack-years of tobacco abuse smoking 2 packs a day up until his recent index of that and now smoking 4 cigarettes a day  ST elevation (STEMI) myocardial infarction involving left anterior descending coronary artery History of CAD status post acute anterior wall myocardial infarction 06/12/15. [name] was brought to the Cath Lab at 2:00 in the afternoon where I demonstrated an occluded mid LAD after the diagonal branch. I started him with a science drug-eluting stent (2.5 mm x 15 mm, postdilated with a 2.75 mm x 12 mm giving a luminal diameter of 2.8 mm). He has 60% AV groove circumflex stenosis. His EF at that time was approximately 25-35% with severe anteroapical hypokinesia. He currently denies chest pain or shortness of breath. He was readmitted to explain her with hemorrhagic pericardial tap and odd and underwent subxiphoid pericardial window placement by Dr. 06/14/15. His EF was severely reduced at that time and he had severe mitral regurg related to prolapse of P1.Cornelius Moras  Pericardial tamponade Mr. [name] had hemorrhagic pericardial tamponade 2 weeks post acute anterior wall myocardial infarction treated with subxiphoid window by Dr. Abran Cantor with relief of his hemodynamic compromise.  Hyperlipidemia LDL goal <70 On statin therapy with recent lipid profile performed 06/14/15 revealing total cholesterol 125, LDL 59 and HDL of 42  Acute on chronic combined systolic  and diastolic heart failure (HCC) Recent 2-D echo performed in late August revealed an ejection fraction in the 25% range with severe mitral regurgitation. I suspect the MR was ischemically mediated. [name] on appropriate medications. [name] asymptomatic. We will recheck a 2-D echo in 3 months.      September MD FACP,FACC,FAHA, [facility] 08/03/2015 10:32 AM

## 2015-08-03 NOTE — Assessment & Plan Note (Signed)
Mr. Devin Ellis had hemorrhagic pericardial tamponade 2 weeks post acute anterior wall myocardial infarction treated with subxiphoid window by Dr. Dorma Russell with relief of his hemodynamic compromise.

## 2015-08-03 NOTE — Assessment & Plan Note (Signed)
History is 80-pack-years of tobacco abuse smoking 2 packs a day up until his recent index of that and now smoking 4 cigarettes a day

## 2015-08-03 NOTE — Assessment & Plan Note (Signed)
Recent 2-D echo performed in late August revealed an ejection fraction in the 25% range with severe mitral regurgitation. I suspect the MR was ischemically mediated. [name] on appropriate medications. [name] asymptomatic. We will recheck a 2-D echo in 3 months.

## 2015-08-03 NOTE — Assessment & Plan Note (Signed)
On statin therapy with recent lipid profile performed 06/14/15 revealing total cholesterol 125, LDL 59 and HDL of 42

## 2015-08-03 NOTE — Patient Instructions (Signed)
Medication Instructions:  Your physician recommends that you continue on your current medications as directed. Please refer to the Current Medication list given to you today.   Labwork: none  Testing/Procedures: Your physician has requested that you have an echocardiogram. Echocardiography is a painless test that uses sound waves to create images of your heart. It provides your doctor with information about the size and shape of your heart and how well your heart's chambers and valves are working. This procedure takes approximately one hour. There are no restrictions for this procedure.    Follow-Up: Your physician recommends that you schedule a follow-up appointment in: 3 months with Dr. Allyson Sabal about a week after ECHO.   Any Other Special Instructions Will Be Listed Below (If Applicable).

## 2015-08-03 NOTE — Assessment & Plan Note (Signed)
History of CAD status post acute anterior wall myocardial infarction 06/12/15. [name] was brought to the Cath Lab at 2:00 in the afternoon where I demonstrated an occluded mid LAD after the diagonal branch. I started him with a science drug-eluting stent (2.5 mm x 15 mm, postdilated with a 2.75 mm x 12 mm giving a luminal diameter of 2.8 mm). He has 60% AV groove circumflex stenosis. His EF at that time was approximately 25-35% with severe anteroapical hypokinesia. He currently denies chest pain or shortness of breath. He was readmitted to explain her with hemorrhagic pericardial tap and odd and underwent subxiphoid pericardial window placement by Dr. Cornelius Moras. His EF was severely reduced at that time and he had severe mitral regurg related to prolapse of P1.[name]

## 2015-08-11 ENCOUNTER — Other Ambulatory Visit: Payer: Self-pay

## 2015-08-11 ENCOUNTER — Ambulatory Visit (HOSPITAL_COMMUNITY): Payer: Medicaid Other | Attending: Cardiovascular Disease

## 2015-08-11 DIAGNOSIS — I340 Nonrheumatic mitral (valve) insufficiency: Secondary | ICD-10-CM | POA: Diagnosis not present

## 2015-08-11 DIAGNOSIS — R29898 Other symptoms and signs involving the musculoskeletal system: Secondary | ICD-10-CM | POA: Insufficient documentation

## 2015-08-11 DIAGNOSIS — I5041 Acute combined systolic (congestive) and diastolic (congestive) heart failure: Secondary | ICD-10-CM | POA: Insufficient documentation

## 2015-08-11 DIAGNOSIS — E785 Hyperlipidemia, unspecified: Secondary | ICD-10-CM | POA: Diagnosis not present

## 2015-08-11 DIAGNOSIS — F17200 Nicotine dependence, unspecified, uncomplicated: Secondary | ICD-10-CM | POA: Diagnosis not present

## 2015-08-11 MED ORDER — PERFLUTREN LIPID MICROSPHERE
1.0000 mL | Freq: Once | INTRAVENOUS | Status: AC
  Administered 2015-08-11: 1 mL via INTRAVENOUS

## 2015-08-18 ENCOUNTER — Ambulatory Visit (INDEPENDENT_AMBULATORY_CARE_PROVIDER_SITE_OTHER): Payer: Medicaid Other | Admitting: Cardiovascular Disease

## 2015-08-18 ENCOUNTER — Encounter: Payer: Self-pay | Admitting: Cardiovascular Disease

## 2015-08-18 VITALS — BP 116/78 | HR 82 | Ht 73.0 in | Wt 169.0 lb

## 2015-08-18 DIAGNOSIS — I255 Ischemic cardiomyopathy: Secondary | ICD-10-CM | POA: Diagnosis not present

## 2015-08-18 MED ORDER — CARVEDILOL 6.25 MG PO TABS: 6.2500 mg | ORAL_TABLET | Freq: Two times a day (BID) | ORAL | Status: DC

## 2015-08-18 NOTE — Patient Instructions (Addendum)
Medication Instructions:  Your physician has recommended you make the following change in your medication:  1) INCREASE Coreg to 6.25 mg tablet by mouth TWICE daily  If you need a refill on your cardiac medications before your next appointment, please call your pharmacy.   Labwork: none  Testing/Procedures: Your physician has requested that you have an echocardiogram. Echocardiography is a painless test that uses sound waves to create images of your heart. It provides your doctor with information about the size and shape of your heart and how well your heart's chambers and valves are working. This procedure takes approximately one hour. There are no restrictions for this procedure. SCHEDULE IN 2 MONTHS.    Follow-Up:  Your physician recommends that you schedule a follow-up appointment in: 2 Weeks with Belenda Cruise for Medication Management.  Your physician recommends that you schedule a follow-up appointment in: 2 months - after ECHO   Any Other Special Instructions Will Be Listed Below (If Applicable).

## 2015-08-18 NOTE — Assessment & Plan Note (Signed)
Devin Ellis returns today for follow-up of his recent 2-D echocardiogram which was performed 08/11/15 revealing an ejection fraction of 30-35%. [name] asymptomatic. [name] on low-dose carvedilol in addition to lisinopril. I'm going to titrate his Coreg up to 25 mg by mouth twice a day with the assistance of Baxter Hire as an outpatient. We will recheck an echo in 2 months for LV function. If it has not risen above 35% we had discussion about referral for ICD implantation for primary prevention.

## 2015-08-18 NOTE — Progress Notes (Signed)
Devin Ellis returns today for follow-up of his recent 2-D echocardiogram which was performed 08/11/15 revealing an ejection fraction of 30-35%. [name] asymptomatic. [name] on low-dose carvedilol in addition to lisinopril. I'm going to titrate his Coreg up to 25 mg by mouth twice a day with the assistance of Kristen as an outpatient. We will recheck an echo in 2 months for LV function. If it has not risen above 35% we had discussion about referral for ICD implantation for primary prevention. 

## 2015-08-20 ENCOUNTER — Telehealth: Payer: Self-pay

## 2015-08-20 NOTE — Telephone Encounter (Signed)
Cardiac Rehab Phase II paper faxed [phone]

## 2015-08-28 ENCOUNTER — Other Ambulatory Visit: Payer: Self-pay | Admitting: Physician Assistant

## 2015-08-28 ENCOUNTER — Telehealth: Payer: Self-pay | Admitting: Physician Assistant

## 2015-08-28 MED ORDER — CLOPIDOGREL BISULFATE 75 MG PO TABS: 75.0000 mg | ORAL_TABLET | Freq: Every day | ORAL | Status: DC

## 2015-08-28 NOTE — Telephone Encounter (Signed)
Patient called report they're out of Plavix. Refill sent.  Exavior Kimmons, PAC

## 2015-09-02 ENCOUNTER — Ambulatory Visit (INDEPENDENT_AMBULATORY_CARE_PROVIDER_SITE_OTHER): Payer: Self-pay | Admitting: Pharmacist Clinician (PhC)/ Clinical Pharmacy Specialist

## 2015-09-02 ENCOUNTER — Encounter: Payer: Self-pay | Admitting: Pharmacist Clinician (PhC)/ Clinical Pharmacy Specialist

## 2015-09-02 VITALS — BP 118/62 | HR 88 | Ht 73.0 in | Wt 165.0 lb

## 2015-09-02 DIAGNOSIS — I5043 Acute on chronic combined systolic (congestive) and diastolic (congestive) heart failure: Secondary | ICD-10-CM

## 2015-09-02 MED ORDER — CARVEDILOL 12.5 MG PO TABS: 12.5000 mg | ORAL_TABLET | Freq: Two times a day (BID) | ORAL | Status: DC

## 2015-09-02 NOTE — Progress Notes (Signed)
09/02/2015 Devin Ellis [birth year] [id number]   HPI:  Devin Ellis is a 54 y.o. male patient of Dr Allyson Sabal, with a PMH below who presents today for titration of his beta blocker.   He suffered an anterior wall MI on 8/13 ad was found to have an EF of 25-35% at that time.  He was started on carvedilol 3.125 mg bid at that time, then dose was increased by Dr. Allyson Sabal on 08/03/15.  He reports some fatigue for 2-3 days after increasing the dose, but now is having no problems with that.  At time of MI he was smoking 2 ppd, but has since cut to light cigarettes and only 4 per day.  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  He reports not carrying large boxes, only armloads at any given time.  He was given clearance to go back to work after his visit with Dr. Allyson Sabal on Oct 4.  He actually started back on Oct 24 and after several days at work began to have some tightness in his right side chest.  On Sunday it "moved" to the left side and he developed some anxiety and diaphoresis.  He went home, put his feet up and rested, with the symptoms resolving after an hour.  He did not go back to work after that, and reports the tightness came and went until Tuesday, and he has felt fine since.    Cardiac Hx: anterior wall MI (05/2015), HF with EF 25%, pericardial tamponade, hyperlipidemia (LDL at 59)  Social Hx: has cut back drastically on cigarettes from 2 packs to 4 cigarettes per day  Home BP readings: patient does not have a home BP cuff   Current Outpatient Prescriptions  Medication Sig Dispense Refill  . aspirin EC 81 MG tablet Take 81 mg by mouth daily.    [name] acetaminophen (TYLENOL) 325 MG tablet Take 2 tablets (650 mg total) by mouth every 4 (four) hours as needed for headache or mild pain.    [name] atorvastatin (LIPITOR) 10 MG tablet Take 1 tablet (10 mg total) by mouth daily. 30 tablet 6  . carvedilol (COREG) 6.25 MG tablet Take 1 tablet  (6.25 mg total) by mouth 2 (two) times daily with a meal. 90 tablet 3  . clopidogrel (PLAVIX) 75 MG tablet Take 1 tablet (75 mg total) by mouth daily. 30 tablet 11  . furosemide (LASIX) 40 MG tablet Take 1 tablet (40 mg total) by mouth as needed.    [name] HYDROcodone-acetaminophen (NORCO/VICODIN) 5-325 MG per tablet Take 1 tablet by mouth every 6 (six) hours as needed for moderate pain.    [name] lisinopril (PRINIVIL,ZESTRIL) 2.5 MG tablet Take 1 tablet (2.5 mg total) by mouth daily. (Patient taking differently: Take 2.5 mg by mouth at bedtime. ) 30 tablet 6  . nitroGLYCERIN (NITROSTAT) 0.4 MG SL tablet Place 1 tablet (0.4 mg total) under the tongue every 5 (five) minutes as needed for chest pain. 25 tablet 4  . potassium chloride SA (K-DUR,KLOR-CON) 20 MEQ tablet Take 1 tablet (20 mEq total) by mouth 2 (two) times daily. 60 tablet 1   No current facility-administered medications for this visit.    No Known Allergies  Past Medical History  Diagnosis Date  . Tobacco abuse   . Hyperlipidemia LDL goal <70 06/14/2015  . S/P angioplasty with stent, to LAD with Xience DES 06/12/15  06/14/2015  . ST elevation (STEMI) myocardial infarction involving  left anterior descending coronary artery (HCC) 06/12/2015  . Cardiomyopathy, ischemic, EF by Echo 40-45% 06/14/2015  . CAD in native artery, residual 60 LCX 06/14/2015  . Pericardial effusion 06/26/2015  . Pericardial tamponade 06/26/2015    Blood pressure 118/62, pulse 88, height 6\' 1"  (1.854 m), weight 165 lb (74.844 kg).    PharmD CPP [facility]

## 2015-09-02 NOTE — Patient Instructions (Signed)
Return for a follow-up appointment in 1 month  Your blood pressure today is 118/62 (your goal is <140/90)   Your heart rate today is 88  Medication changes made during today's appointment: increase carvedilol to 12.5 mg twice daily (take 2 of the 6.25 mg tablets twice daily until gone)  We increased the dose of carvedilol today. Please record and bring your home blood pressure and heart rate readings to your next appointment. It is important to notify your doctor if you notice your heart rate is below 50 or if you start to feel symptoms associated with low blood pressure such as dizziness, fatigue, or light-headedness. We will not increase the dose further if you start to experience these symptoms.   How to take blood pressure at home:  . Rest for 5 minutes before measuring   . Sit up straight with both feet on the floor (don't cross your legs). Use the appropriate size cuff; it should fit smoothly and snugly around your bare upper arm. Make sure the bottom edge of the cuff is about an inch above the crease of your elbow.  . Make sure the arm that the cuff is on is relaxed and elevated to about heart level. This can usually be accomplished by resting your arm on a table or arm rest of a chair.  . Remember: do not smoke, drink caffeine, eat a meal, or exercise for at least 30 minutes before taking your blood pressure.  Other steps to take specific to your condition:  . Limit salt intake: try salt substitutes such as Mrs. Dash, onion powder, or black pepper.  . Daily weights: weigh yourself each morning before eating/drinking but after urinating. [name] Physical activity: try to increase physical activity. Even walking 30 minutes daily helps.   Call your doctor if these symptoms occur:  . If you notice weight gain exceeding 3 pounds in a day or 5 pounds in a week  . If you notice swelling around your legs, calves, ankles, and/or feet  . If you become short of breath or find that it is more difficult  than usual to move around  . If you have to use pillows or sit up at night due to not being able to breathe while laying down

## 2015-09-02 NOTE — Assessment & Plan Note (Signed)
Today his HR and BP are both stable at 88 and 118/62.  He has never had problems with hypertension in the past and reports no symptoms of hypotension today.  I am going to increase his carvedilol to 12.5 mg twice daily and explained the purpose of the medication as well as potential side effects.  I reviewed his chest tightness symptoms with Corine Shelter PA and it was decided that since he is feeling better he should try working again to see if the symptoms return.  I suggested that maybe instead of 10-12 hour days, he go back for shorter days and work his way back up to full time, but he states that he can either work full time or not at all, his job doesn't work well at only part time.  I will see him back in a month for further beta blocker titration

## 2015-09-30 ENCOUNTER — Ambulatory Visit: Payer: Medicaid Other | Admitting: Pharmacist Clinician (PhC)/ Clinical Pharmacy Specialist

## 2015-10-01 ENCOUNTER — Telehealth: Payer: Self-pay | Admitting: Pharmacist Clinician (PhC)/ Clinical Pharmacy Specialist

## 2015-10-01 ENCOUNTER — Ambulatory Visit: Payer: Medicaid Other | Admitting: Pharmacist Clinician (PhC)/ Clinical Pharmacy Specialist

## 2015-10-01 NOTE — Telephone Encounter (Signed)
Patient wanted you to know he had to reschedule his appointment today until after the first of the year.  He currently has no insurance.

## 2015-10-19 ENCOUNTER — Other Ambulatory Visit (HOSPITAL_COMMUNITY): Payer: Medicaid Other

## 2015-10-26 ENCOUNTER — Ambulatory Visit: Payer: Medicaid Other | Admitting: Cardiovascular Disease

## 2015-11-09 ENCOUNTER — Ambulatory Visit (INDEPENDENT_AMBULATORY_CARE_PROVIDER_SITE_OTHER): Payer: Managed Care, Other (non HMO) | Admitting: Pharmacist Clinician (PhC)/ Clinical Pharmacy Specialist

## 2015-11-09 ENCOUNTER — Encounter: Payer: Self-pay | Admitting: Pharmacist Clinician (PhC)/ Clinical Pharmacy Specialist

## 2015-11-09 ENCOUNTER — Ambulatory Visit: Payer: Medicaid Other | Admitting: Pharmacist Clinician (PhC)/ Clinical Pharmacy Specialist

## 2015-11-09 VITALS — BP 110/72 | HR 76 | Ht 73.0 in | Wt 167.7 lb

## 2015-11-09 DIAGNOSIS — I5043 Acute on chronic combined systolic (congestive) and diastolic (congestive) heart failure: Secondary | ICD-10-CM

## 2015-11-09 MED ORDER — CARVEDILOL 25 MG PO TABS: 25.0000 mg | ORAL_TABLET | Freq: Two times a day (BID) | ORAL | Status: DC

## 2015-11-09 NOTE — Assessment & Plan Note (Signed)
Today patient is feeling fine and having no problems with medications.  Will increase his carvedilol to 25 mg bid at this time.  [name] aware of possible side effects of the increased dose, but fortunately has appointment with Dr. Allyson Sabal next week.  His BP is good today at 110/72, so he was advised to also watch for any signs of low BP.   [name] to call in the meantime with any concerns.

## 2015-11-09 NOTE — Progress Notes (Signed)
     11/09/2015 Devin Ellis 04-Oct-1961 [id number]   HPI:  El Pile is a 55 y.o. male patient of Dr Allyson Sabal, with a PMH below who presents today for titration of his beta blocker.   He suffered an anterior wall MI on 8/13 and was found to have an EF of 25-35%.  He was most recently titrated to 12.5 mg bid of carvedilol on Nov 3.  He was unable to keep a December appointment for further titration due to insurance problems.  To day he comes in and reports to feeling great - no SOB, CP, dizziness or edema.  His weight has been steady, today up 2 pounds from November.  He continues to smoke 4 cigarettes per day (prior to MI was at 2 ppd).    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   Cardiac Hx: anterior wall MI (05/2015), HF with EF 25%, pericardial tamponade, hyperlipidemia (LDL at 59)  Social Hx: has cut back drastically on cigarettes from 2 packs to 4 cigarettes per day  Diet: watches salt intake closely, eats mostly chicken and fish, not many vegetables due to lack of teeth  Exercise: on his feet and walking frequently at work, tries to walk and stay active at home as well   Current Outpatient Prescriptions  Medication Sig Dispense Refill  . acetaminophen (TYLENOL) 325 MG tablet Take 2 tablets (650 mg total) by mouth every 4 (four) hours as needed for headache or mild pain.    [name] aspirin EC 81 MG tablet Take 81 mg by mouth daily.    [name] atorvastatin (LIPITOR) 10 MG tablet Take 1 tablet (10 mg total) by mouth daily. 30 tablet 6  . carvedilol (COREG) 12.5 MG tablet Take 1 tablet (12.5 mg total) by mouth 2 (two) times daily. 60 tablet 3  . clopidogrel (PLAVIX) 75 MG tablet Take 1 tablet (75 mg total) by mouth daily. 30 tablet 11  . furosemide (LASIX) 40 MG tablet Take 1 tablet (40 mg total) by mouth as needed.    [name] HYDROcodone-acetaminophen (NORCO/VICODIN) 5-325 MG per tablet Take 1 tablet by mouth every 6 (six) hours as  needed for moderate pain.    [name] lisinopril (PRINIVIL,ZESTRIL) 2.5 MG tablet Take 1 tablet (2.5 mg total) by mouth daily. (Patient taking differently: Take 2.5 mg by mouth at bedtime. ) 30 tablet 6  . nitroGLYCERIN (NITROSTAT) 0.4 MG SL tablet Place 1 tablet (0.4 mg total) under the tongue every 5 (five) minutes as needed for chest pain. 25 tablet 4  . potassium chloride SA (K-DUR,KLOR-CON) 20 MEQ tablet Take 1 tablet (20 mEq total) by mouth 2 (two) times daily. 60 tablet 1   No current facility-administered medications for this visit.    No Known Allergies  Past Medical History  Diagnosis Date  . Tobacco abuse   . Hyperlipidemia LDL goal <70 06/14/2015  . S/P angioplasty with stent, to LAD with Xience DES 06/12/15  06/14/2015  . ST elevation (STEMI) myocardial infarction involving left anterior descending coronary artery (HCC) 06/12/2015  . Cardiomyopathy, ischemic, EF by Echo 40-45% 06/14/2015  . CAD in native artery, residual 60 LCX 06/14/2015  . Pericardial effusion 06/26/2015  . Pericardial tamponade 06/26/2015    Blood pressure 110/72, pulse 76, height 6\' 1"  (1.854 m), weight 167 lb 11.2 oz (76.068 kg).    PharmD CPP [facility]

## 2015-11-09 NOTE — Patient Instructions (Signed)
Your blood pressure today is 110/72  HR 76  Medication changes:  Increase carvedilol to 25 mg twice daily (take 2 of the 12.5 mg tablets twice daily until they are gone)  Bring all of your meds, your BP cuff and your record of home blood pressures to your next appointment.  Exercise as you're able, try to walk approximately 30 minutes per day.  Keep salt intake to a minimum, especially watch canned and prepared boxed foods.  Eat more fresh fruits and vegetables and fewer canned items.  Avoid eating in fast food restaurants.   Call if you notice any intolerable side effects that last longer than 1 week (dizziness, lethargy, fatigue)

## 2015-11-11 ENCOUNTER — Other Ambulatory Visit: Payer: Self-pay

## 2015-11-11 ENCOUNTER — Ambulatory Visit (HOSPITAL_COMMUNITY): Payer: Managed Care, Other (non HMO) | Attending: Cardiology

## 2015-11-11 ENCOUNTER — Other Ambulatory Visit: Payer: Self-pay | Admitting: Cardiovascular Disease

## 2015-11-11 DIAGNOSIS — I255 Ischemic cardiomyopathy: Secondary | ICD-10-CM | POA: Insufficient documentation

## 2015-11-11 DIAGNOSIS — F17200 Nicotine dependence, unspecified, uncomplicated: Secondary | ICD-10-CM | POA: Insufficient documentation

## 2015-11-11 DIAGNOSIS — R29898 Other symptoms and signs involving the musculoskeletal system: Secondary | ICD-10-CM | POA: Insufficient documentation

## 2015-11-11 DIAGNOSIS — I5189 Other ill-defined heart diseases: Secondary | ICD-10-CM | POA: Diagnosis not present

## 2015-11-11 DIAGNOSIS — I340 Nonrheumatic mitral (valve) insufficiency: Secondary | ICD-10-CM | POA: Insufficient documentation

## 2015-11-11 MED ORDER — PERFLUTREN LIPID MICROSPHERE
1.0000 mL | INTRAVENOUS | Status: AC | PRN
  Administered 2015-11-11: 2 mL via INTRAVENOUS

## 2015-11-15 ENCOUNTER — Telehealth: Payer: Self-pay | Admitting: Cardiovascular Disease

## 2015-11-15 ENCOUNTER — Telehealth: Payer: Self-pay | Admitting: Pharmacist Clinician (PhC)/ Clinical Pharmacy Specialist

## 2015-11-15 MED ORDER — CARVEDILOL 12.5 MG PO TABS: 12.5000 mg | ORAL_TABLET | Freq: Two times a day (BID) | ORAL | Status: DC

## 2015-11-15 NOTE — Telephone Encounter (Signed)
LMTC  Echo results OK w improved LVEF. Has appt tomorrow w Dr. Allyson Sabal

## 2015-11-15 NOTE — Telephone Encounter (Signed)
Returning a call from Friday.

## 2015-11-15 NOTE — Telephone Encounter (Signed)
Wife LMOM this am that Devin Ellis is not doing well on the 25mg  bid dose of carvedilol.  States it is causing him to be lightheaded and have headaches.    Returned call, [facility] for him to cut back to 12.5 mg bid and keep appointment with Dr. [facility] for tomorrow.

## 2015-11-15 NOTE — Telephone Encounter (Signed)
Pt's wife called back. I informed her of the appt with Dr. Allyson Sabal tomorrow and she said that he was aware. Please f/u or if there was nothing else can you close this encounter   Thanks

## 2015-11-16 ENCOUNTER — Encounter: Payer: Self-pay | Admitting: Cardiovascular Disease

## 2015-11-16 ENCOUNTER — Ambulatory Visit (INDEPENDENT_AMBULATORY_CARE_PROVIDER_SITE_OTHER): Payer: Managed Care, Other (non HMO) | Admitting: Cardiovascular Disease

## 2015-11-16 VITALS — BP 106/74 | HR 69 | Ht 73.0 in | Wt 165.0 lb

## 2015-11-16 DIAGNOSIS — I255 Ischemic cardiomyopathy: Secondary | ICD-10-CM | POA: Diagnosis not present

## 2015-11-16 NOTE — Assessment & Plan Note (Signed)
Mr. Cadle returns for follow-up of his 2-D echo that was recently performed in follow-up of a loan ejection fraction 11/11/15. He had a large anterior wall myocardial infarction 06/12/15 treated with stenting of his mid LAD with a drug-eluting stent by myself performed radially. His EF at that time was 25-35%. He had residual 60% AV groove circumflex stenosis. [name] totally asymptomatic. When I saw him last in the office 08/18/15 we had talked about the possibility of an ICD for primary prevention given his low EF. He has no symptoms of heart failure. I titrated his medications over the last 3 months and recheck a 2-D echo that showed subsequent improvement in his ejection fraction up to 40-45% obviating  the need of an ICD. We will continue with optimal medical therapy. He'll see mid-level provider back in 6 months and me back in one year. [name] down to smoking 3 cigarettes a day without about the importance of smoking cessation. His lipid profile is excellent for secondary prevention. 

## 2015-11-16 NOTE — Patient Instructions (Signed)

## 2015-11-16 NOTE — Progress Notes (Signed)
Mr. Dubuque returns for follow-up of his 2-D echo that was recently performed in follow-up of a loan ejection fraction 11/11/15. He had a large anterior wall myocardial infarction 06/12/15 treated with stenting of his mid LAD with a drug-eluting stent by myself performed radially. His EF at that time was 25-35%. He had residual 60% AV groove circumflex stenosis. [name] totally asymptomatic. When I saw him last in the office 08/18/15 we had talked about the possibility of an ICD for primary prevention given his low EF. He has no symptoms of heart failure. I titrated his medications over the last 3 months and recheck a 2-D echo that showed subsequent improvement in his ejection fraction up to 40-45% obviating  the need of an ICD. We will continue with optimal medical therapy. He'll see mid-level provider back in 6 months and me back in one year. [name] down to smoking 3 cigarettes a day without about the importance of smoking cessation. His lipid profile is excellent for secondary prevention.

## 2016-01-04 ENCOUNTER — Telehealth: Payer: Self-pay | Admitting: Cardiology

## 2016-01-04 MED ORDER — LISINOPRIL 2.5 MG PO TABS: 2.5000 mg | ORAL_TABLET | Freq: Every day | ORAL | Status: DC

## 2016-01-04 MED ORDER — ATORVASTATIN CALCIUM 10 MG PO TABS: 10.0000 mg | ORAL_TABLET | Freq: Every day | ORAL | Status: DC

## 2016-01-04 NOTE — Telephone Encounter (Signed)
Do not need the encounter

## 2016-01-04 NOTE — Telephone Encounter (Signed)
Refills sent

## 2016-01-04 NOTE — Telephone Encounter (Signed)
°*  STAT* If patient is at the pharmacy, call can be transferred to refill team.   1. Which medications need to be refilled? (please list name of each medication and dose if known) Atorvastatin 10 mg and Lisinopril 2.5 mg  2. Which pharmacy/location (including street and city if local pharmacy) is medication to be sent to?[phone]  3. Do they need a 30 day or 90 day supply? 90 and refills

## 2016-01-18 ENCOUNTER — Other Ambulatory Visit: Payer: Self-pay | Admitting: Cardiovascular Disease

## 2016-01-18 NOTE — Telephone Encounter (Signed)
Rx(s) sent to pharmacy electronically.  

## 2016-04-29 DIAGNOSIS — M069 Rheumatoid arthritis, unspecified: Secondary | ICD-10-CM

## 2016-04-29 HISTORY — DX: Rheumatoid arthritis, unspecified: M06.9

## 2016-05-19 DIAGNOSIS — M0579 Rheumatoid arthritis with rheumatoid factor of multiple sites without organ or systems involvement: Secondary | ICD-10-CM | POA: Insufficient documentation

## 2016-07-06 ENCOUNTER — Encounter: Payer: Self-pay | Admitting: Physician Assistant

## 2016-07-06 ENCOUNTER — Ambulatory Visit (INDEPENDENT_AMBULATORY_CARE_PROVIDER_SITE_OTHER): Payer: BLUE CROSS/BLUE SHIELD | Admitting: Physician Assistant

## 2016-07-06 VITALS — BP 120/60 | HR 80 | Ht 73.0 in | Wt 168.0 lb

## 2016-07-06 DIAGNOSIS — I2102 ST elevation (STEMI) myocardial infarction involving left anterior descending coronary artery: Secondary | ICD-10-CM | POA: Diagnosis not present

## 2016-07-06 DIAGNOSIS — I319 Disease of pericardium, unspecified: Secondary | ICD-10-CM | POA: Diagnosis not present

## 2016-07-06 DIAGNOSIS — I255 Ischemic cardiomyopathy: Secondary | ICD-10-CM | POA: Diagnosis not present

## 2016-07-06 DIAGNOSIS — I5022 Chronic systolic (congestive) heart failure: Secondary | ICD-10-CM

## 2016-07-06 DIAGNOSIS — I3139 Other pericardial effusion (noninflammatory): Secondary | ICD-10-CM

## 2016-07-06 DIAGNOSIS — I313 Pericardial effusion (noninflammatory): Secondary | ICD-10-CM

## 2016-07-06 NOTE — Patient Instructions (Signed)
Continue same medications  Schedule Echo in 10/2016 before appointment with Dr.Berry   Your physician wants you to follow-up in: 10/2016 with Dr.Berry. You will receive a reminder letter in the mail two months in advance. If you don't receive a letter, please call our office to schedule the follow-up appointment.

## 2016-07-06 NOTE — Progress Notes (Signed)
Cardiology Office Note   Date:  07/06/2016   ID:  Devin Ellis, DOB 28-Jan-1961, MRN [id number]  PCP:  Horald Pollen., PA-C  Cardiologist:  Dr William Hamburger, PA-C   Chief Complaint  Patient presents with  . Appointment    no complaints    History of Present Illness: Devin Ellis is a 55 y.o. male with a history of AMI 05/2015 w/ DES LAD, CFX 60%, EF 25-35%>>40-45%  echo, HLD, peric eff 2 wks after MI s/p peric window w/ 550 cc out.  Last o.v. Dr Allyson Sabal 10/2015, doing well, decreased tob use.   Devin Ellis presents for Scheduled follow-up.  In July 2017, he was diagnosed with rheumatoid arthritis. [name] on medications for this, but they have not helped much. Because he is unable to do much with his hands, he has not been able to work.  [name] active and walks up to 5 miles daily. He does not get chest pain or shortness of breath with activity. [name] able to walk up hills into things around the house without any difficulty. He does not have lower extremity edema, orthopnea or PND. He does not have palpitations, presyncope or syncope.  [name] concerned that he might have lost some weight, but his weight is actually stable. [name] compliant with his medications. [name] still smoking a few cigarettes a day, but he is hoping to quit completely. He feels he is eating a healthy diet.   Past Medical History:  Diagnosis Date  . CAD in native artery, residual 60 LCX 06/14/2015  . Cardiomyopathy, ischemic, EF by Echo 40-45% 06/14/2015  . Hyperlipidemia LDL goal <70 06/14/2015  . Pericardial effusion 06/26/2015  . Pericardial tamponade 06/26/2015  . S/P angioplasty with stent, to LAD with Xience DES 06/12/15  06/14/2015  . ST elevation (STEMI) myocardial infarction involving left anterior descending coronary artery (HCC) 06/12/2015  . Tobacco abuse     Past Surgical History:  Procedure Laterality Date  . CARDIAC CATHETERIZATION N/A 06/12/2015   Procedure: Left Heart Cath and  Coronary Angiography;  Surgeon: Runell Gess, MD;  Location[facility] INVASIVE CV LAB;  Service: Cardiovascular;  Laterality: N/A;  . CARDIAC CATHETERIZATION N/A 06/12/2015   Procedure: Coronary Stent Intervention;  Surgeon: Runell Gess, MD;  Location: [facility] INVASIVE CV LAB;  Service: Cardiovascular;  Laterality: N/A;  . SHOULDER SURGERY Right    Torn bicep  . SUBXYPHOID PERICARDIAL WINDOW N/A 06/26/2015   Procedure: SUBXYPHOID PERICARDIAL WINDOW;  Surgeon: Purcell Nails, MD;  Location[facility] OR;  Service: Thoracic;  Laterality: N/A;    Current Outpatient Prescriptions  Medication Sig Dispense Refill  . acetaminophen (TYLENOL) 325 MG tablet Take 2 tablets (650 mg total) by mouth every 4 (four) hours as needed for headache or mild pain.    [name] aspirin EC 81 MG tablet Take 81 mg by mouth daily.    [name] atorvastatin (LIPITOR) 10 MG tablet Take 1 tablet (10 mg total) by mouth daily. 90 tablet 3  . carvedilol (COREG) 12.5 MG tablet TAKE 1 TABLET BY MOUTH TWICE A DAY 60 tablet 10  . clopidogrel (PLAVIX) 75 MG tablet Take 1 tablet (75 mg total) by mouth daily. 30 tablet 11  . folic acid (FOLVITE) 1 MG tablet Take 1 mg by mouth daily.  3  . furosemide (LASIX) 40 MG tablet Take 1 tablet (40 mg total) by mouth as needed.    [name] HYDROcodone-acetaminophen (NORCO/VICODIN) 5-325 MG per tablet Take  1 tablet by mouth every 6 (six) hours as needed for moderate pain.    [name] lisinopril (PRINIVIL,ZESTRIL) 2.5 MG tablet Take 1 tablet (2.5 mg total) by mouth daily. 90 tablet 3  . methotrexate (RHEUMATREX) 2.5 MG tablet Take 6 tablets by mouth once a week.    . nitroGLYCERIN (NITROSTAT) 0.4 MG SL tablet Place 1 tablet (0.4 mg total) under the tongue every 5 (five) minutes as needed for chest pain. 25 tablet 4  . potassium chloride SA (K-DUR,KLOR-CON) 20 MEQ tablet Take 1 tablet (20 mEq total) by mouth 2 (two) times daily. 60 tablet 1  . predniSONE (DELTASONE) 5 MG tablet Take 5 mg by mouth 2 (two) times daily.  1   No current  facility-administered medications for this visit.     Allergies:   Review of patient's allergies indicates no known allergies.    Social History:  The patient  reports that he has been smoking Cigarettes.  He has a 7.50 pack-year smoking history. He does not have any smokeless tobacco history on file.   Family History:  The patient's family history includes Diabetes in his father and mother; Hypertension in his mother.    ROS:  Please see the history of present illness. All other systems are reviewed and negative.    PHYSICAL EXAM: VS:  BP 120/60   Pulse 80   Ht 6\' 1"  (1.854 m)   Wt 168 lb (76.2 kg)   BMI 22.16 kg/m  , BMI Body mass index is 22.16 kg/m. GEN: Well nourished, well developed, male in no acute distress  HEENT: normal for age  Neck: no JVD, no carotid bruit, no masses Cardiac: RRR; no murmur, no rubs, or gallops Respiratory:  clear to auscultation bilaterally, normal work of breathing GI: soft, nontender, nondistended, + BS MS: no deformity or atrophy; no edema; distal pulses are 2+ in all 4 extremities   Skin: warm and dry, no rash Neuro:  Strength and sensation are intact Psych: euthymic mood, full affect   EKG:  EKG is not ordered today.  Recent Labs: No results found for requested labs within last 8760 hours.    Lipid Panel    Component Value Date/Time   CHOL 125 06/14/2015 0406   TRIG 119 06/14/2015 0406   HDL 42 06/14/2015 0406   CHOLHDL 3.0 06/14/2015 0406   VLDL 24 06/14/2015 0406   LDLCALC 59 06/14/2015 0406     Wt Readings from Last 3 Encounters:  07/06/16 168 lb (76.2 kg)  11/16/15 165 lb (74.8 kg)  11/09/15 167 lb 11.2 oz (76.1 kg)     Other studies Reviewed: Additional studies/ records that were reviewed today include: Office notes and hospital records.  ASSESSMENT AND PLAN:  1.  CAD: [name] having no ongoing ischemic symptoms. [name] on good therapy with aspirin, statin, beta blocker, Plavix and an ACE inhibitor. His lipids have  been under good control.  2. Ischemic cardiomyopathy: His EF had improved to 40-45 percent by echocardiogram. An ICD is not indicated. [name] on low dose of an ACE inhibitor, and Lasix. He has no signs or symptoms of volume overload. Continue current therapy. Recheck echo prior to following up with Dr. 01/07/16.  He wonders if he can ever come off of these medications. I advised him that in order to help his heart muscle be as strong as he can, he needs to stay on them as he will most likely regress if he does not. [name] in agreement  with this.   Current medicines are reviewed at length with the patient today.  The patient has concerns regarding medicines. Concerns were addressed  The following changes have been made:  no change  Labs/ tests ordered today include: Labs per primary M.D.  No orders of the defined types were placed in this encounter.   Disposition:   FU with Dr. Allyson Sabal  Signed, Theodore Demark, PA-C  07/06/2016 10:08 AM    [facility] Phone: [phone]; Fax: [phone]  This note was written with the assistance of speech recognition software. Please excuse any transcriptional errors.

## 2016-08-04 ENCOUNTER — Encounter: Payer: Self-pay | Admitting: Physician Assistant

## 2016-08-14 ENCOUNTER — Other Ambulatory Visit: Payer: Self-pay

## 2016-08-14 MED ORDER — CLOPIDOGREL BISULFATE 75 MG PO TABS: 75.0000 mg | ORAL_TABLET | Freq: Every day | ORAL | 11 refills | Status: DC

## 2016-11-06 ENCOUNTER — Other Ambulatory Visit: Payer: Self-pay

## 2016-11-06 ENCOUNTER — Ambulatory Visit (HOSPITAL_COMMUNITY): Payer: BLUE CROSS/BLUE SHIELD | Attending: Cardiology

## 2016-11-06 DIAGNOSIS — I509 Heart failure, unspecified: Secondary | ICD-10-CM | POA: Diagnosis not present

## 2016-11-06 DIAGNOSIS — E785 Hyperlipidemia, unspecified: Secondary | ICD-10-CM | POA: Diagnosis not present

## 2016-11-06 DIAGNOSIS — I313 Pericardial effusion (noninflammatory): Secondary | ICD-10-CM | POA: Insufficient documentation

## 2016-11-06 DIAGNOSIS — I2102 ST elevation (STEMI) myocardial infarction involving left anterior descending coronary artery: Secondary | ICD-10-CM

## 2016-11-06 DIAGNOSIS — I252 Old myocardial infarction: Secondary | ICD-10-CM | POA: Insufficient documentation

## 2016-11-06 DIAGNOSIS — I5022 Chronic systolic (congestive) heart failure: Secondary | ICD-10-CM

## 2016-11-06 DIAGNOSIS — I255 Ischemic cardiomyopathy: Secondary | ICD-10-CM | POA: Insufficient documentation

## 2016-11-06 DIAGNOSIS — I2510 Atherosclerotic heart disease of native coronary artery without angina pectoris: Secondary | ICD-10-CM | POA: Insufficient documentation

## 2016-11-06 DIAGNOSIS — Z720 Tobacco use: Secondary | ICD-10-CM | POA: Insufficient documentation

## 2016-11-06 DIAGNOSIS — I3139 Other pericardial effusion (noninflammatory): Secondary | ICD-10-CM

## 2016-11-06 LAB — ECHOCARDIOGRAM COMPLETE
Ao-asc: 29 cm
CHL CUP DOP CALC LVOT VTI: 14.8 cm
CHL CUP MV DEC (S): 261
CHL CUP STROKE VOLUME: 40 mL
CHL CUP TV REG PEAK VELOCITY: 242 cm/s
E decel time: 261 msec
EERAT: 9.65
FS: 23 % — AB (ref 28–44)
IV/PV OW: 1.18
LA vol A4C: 46 ml
LADIAMINDEX: 1.85 cm/m2
LASIZE: 37 mm
LAVOL: 46 mL
LAVOLIN: 23 mL/m2
LDCA: 4.15 cm2
LEFT ATRIUM END SYS DIAM: 37 mm
LV E/e' medial: 9.65
LV E/e'average: 9.65
LV sys vol index: 27 mL/m2
LV sys vol: 54 mL (ref 21–61)
LVDIAVOL: 94 mL (ref 62–150)
LVDIAVOLIN: 47 mL/m2
LVELAT: 7.21 cm/s
LVOT peak vel: 69.2 cm/s
LVOTD: 23 mm
LVOTSV: 61 mL
Lateral S' vel: 10.1 cm/s
MV pk A vel: 62.7 m/s
MV pk E vel: 69.6 m/s
PW: 9.31 mm — AB (ref 0.6–1.1)
RV sys press: 26 mmHg
Simpson's disk: 43
TDI e' lateral: 7.21
TDI e' medial: 6.53
TR max vel: 242 cm/s

## 2016-11-06 MED ORDER — PERFLUTREN LIPID MICROSPHERE
1.0000 mL | INTRAVENOUS | Status: AC | PRN
  Administered 2016-11-06: 2 mL via INTRAVENOUS

## 2016-11-07 ENCOUNTER — Ambulatory Visit: Payer: BLUE CROSS/BLUE SHIELD | Admitting: Cardiovascular Disease

## 2016-11-28 ENCOUNTER — Encounter: Payer: Self-pay | Admitting: Cardiovascular Disease

## 2016-11-28 ENCOUNTER — Ambulatory Visit (INDEPENDENT_AMBULATORY_CARE_PROVIDER_SITE_OTHER): Payer: BLUE CROSS/BLUE SHIELD | Admitting: Cardiovascular Disease

## 2016-11-28 VITALS — BP 98/76 | HR 75 | Ht 73.0 in | Wt 168.2 lb

## 2016-11-28 DIAGNOSIS — I255 Ischemic cardiomyopathy: Secondary | ICD-10-CM

## 2016-11-28 DIAGNOSIS — I2102 ST elevation (STEMI) myocardial infarction involving left anterior descending coronary artery: Secondary | ICD-10-CM | POA: Diagnosis not present

## 2016-11-28 DIAGNOSIS — E785 Hyperlipidemia, unspecified: Secondary | ICD-10-CM

## 2016-11-28 NOTE — Assessment & Plan Note (Signed)
History of hyperlipidemia on statin therapy followed by his PCP 

## 2016-11-28 NOTE — Patient Instructions (Signed)
Medication Instructions: Your physician recommends that you continue on your current medications as directed. Please refer to the Current Medication list given to you today.  Follow-Up: Your physician recommends that you schedule a follow-up appointment with Dr. Royann Shivers for ICD therapy.  Your physician wants you to follow-up in: 1 year with Dr. Allyson Sabal. You will receive a reminder letter in the mail two months in advance. If you don't receive a letter, please call our office to schedule the follow-up appointment.  If you need a refill on your cardiac medications before your next appointment, please call your pharmacy.

## 2016-11-28 NOTE — Assessment & Plan Note (Signed)
History of CAD status post emergent cardiac catheterization in the setting of an anterior STEMI 06/12/15 treated with drug-eluting stenting of the mid LAD performed radially by myself. His EF at that time was 25-35%. Did have a residual 60% AV groove circumflex stenosis which was treated medically. [name] totally asymptomatic. Because a large pericardial effusion he had a pericardial window placed 2 weeks post MI. He has remained on dual antiplatelet therapy.

## 2016-11-28 NOTE — Progress Notes (Signed)
11/28/2016 Devin Ellis   [birth year]  [id number]  Primary Physician Horald Pollen., PA-C Primary Cardiologist: Runell Gess MD Roseanne Reno  HPI:   Mr. Devin Ellis returns today for post hospital follow-up. [name] accompanied by his wife. [name] a 56 year old thin-appearing married Caucasian male father of 5 children who works at [employer]. His primary care physician is Dr. Harland German at [place] in [place]. I last saw him in the office 11/16/15. He had an acute anterior wall myocardial infarction on 06/12/15 treated with stenting of his mid LAD with a drug-eluting stent in the right radial approach by myself. His EF at that time was 25-35% with anteroapical wall motion and mobility. He had a 60% AV groove circumflex stenosis. His risk factor profile is notable for 80 pack years of tobacco smoking 2 packs a day up until the point now smoking 4 cigarettes a day. 2 weeks post discharge from the hospital he was admitted with pericardial tamponade due to  hemorrhagic pericardial effusion undergoing a subxiphoid pericardial window by Dr. Cornelius Moras . Since discharge she still remarkably well. He has no shortness of breath or chest pain. I released him to go back to work. Since I saw me her ago he has seen Rhonda dye in the office 07/06/16. A 2-D echo recently performed 11/06/16 revealed a decline in his ejection fraction to 30-35% range. Based on this, I'm going to refer him to Dr. Royann Shivers to discuss ICD implantation for primary prevention.   Current Outpatient Prescriptions  Medication Sig Dispense Refill  . acetaminophen (TYLENOL) 325 MG tablet Take 2 tablets (650 mg total) by mouth every 4 (four) hours as needed for headache or mild pain.    [name] aspirin EC 81 MG tablet Take 81 mg by mouth daily.    [name] atorvastatin (LIPITOR) 10 MG tablet Take 1 tablet (10 mg total) by mouth daily. 90 tablet 3  . carvedilol (COREG) 12.5 MG tablet TAKE 1 TABLET BY MOUTH TWICE A DAY 60 tablet 10  . clopidogrel  (PLAVIX) 75 MG tablet Take 1 tablet (75 mg total) by mouth daily. 30 tablet 11  . folic acid (FOLVITE) 1 MG tablet Take 1 mg by mouth daily.  3  . HYDROcodone-acetaminophen (NORCO/VICODIN) 5-325 MG per tablet Take 1 tablet by mouth every 6 (six) hours as needed for moderate pain.    [name] lisinopril (PRINIVIL,ZESTRIL) 2.5 MG tablet Take 1 tablet (2.5 mg total) by mouth daily. 90 tablet 3  . methotrexate (RHEUMATREX) 2.5 MG tablet Take 6 tablets by mouth once a week.    . nitroGLYCERIN (NITROSTAT) 0.4 MG SL tablet Place 1 tablet (0.4 mg total) under the tongue every 5 (five) minutes as needed for chest pain. 25 tablet 4  . predniSONE (DELTASONE) 5 MG tablet Take 5 mg by mouth 2 (two) times daily.  1   No current facility-administered medications for this visit.     No Known Allergies  Social History   Social History  . Marital status: Unknown    Spouse name: N/A  . Number of children: 5  . Years of education: N/A   Occupational History  . Not employed, was in the warehouse at [employer]    Social History Main Topics  . Smoking status: Current Every Day Smoker    Packs/day: 0.25    Years: 30.00    Types: Cigarettes  . Smokeless tobacco: Never Used  . Alcohol use Not on file  . Drug use: Unknown  .  Sexual activity: Not on file   Other Topics Concern  . Not on file   Social History Narrative   Married.  Five children total between him and his wife.  He works for [employer] in [employer].       Review of Systems: General: negative for chills, fever, night sweats or weight changes.  Cardiovascular: negative for chest pain, dyspnea on exertion, edema, orthopnea, palpitations, paroxysmal nocturnal dyspnea or shortness of breath Dermatological: negative for rash Respiratory: negative for cough or wheezing Urologic: negative for hematuria Abdominal: negative for nausea, vomiting, diarrhea, bright red blood per rectum, melena, or hematemesis Neurologic: negative for visual  changes, syncope, or dizziness All other systems reviewed and are otherwise negative except as noted above.    Blood pressure 98/76, pulse 75, height 6\' 1"  (1.854 m), weight 168 lb 3.2 oz (76.3 kg).  General appearance: alert and no distress Neck: no adenopathy, no carotid bruit, no JVD, supple, symmetrical, trachea midline and thyroid not enlarged, symmetric, no tenderness/mass/nodules Lungs: clear to auscultation bilaterally Heart: regular rate and rhythm, S1, S2 normal, no murmur, click, rub or gallop Extremities: extremities normal, atraumatic, no cyanosis or edema  EKG normal sinus rhythm at 75 with septal Q waves. I personally reviewed his EKG  ASSESSMENT AND PLAN:   ST elevation (STEMI) myocardial infarction involving left anterior descending coronary artery History of CAD status post emergent cardiac catheterization in the setting of an anterior STEMI 06/12/15 treated with drug-eluting stenting of the mid LAD performed radially by myself. His EF at that time was 25-35%. Did have a residual 60% AV groove circumflex stenosis which was treated medically. [name] totally asymptomatic. Because a large pericardial effusion he had a pericardial window placed 2 weeks post MI. He has remained on dual antiplatelet therapy.  Hyperlipidemia LDL goal <70 History of hyperlipidemia on statin therapy followed by his PCP  Ischemic cardiomyopathy History of ischemic cardiomyopathy the recent 2-D echo revealed ejection fraction had fallen from the 40-45% range to the 30-35% range he is on appropriate medications. [name] totally symptomatic from this. I am concerned that he is in a reduced ejection fraction range that would benefit from ICD placement for primary prevention. I'm going to refer him to Dr. 06/14/15 to discuss ICD implantation for primary prevention given his decline in ejection fraction.      Royann Shivers MD FACP,FACC,FAHA, [facility] 11/28/2016 9:45 AM

## 2016-11-28 NOTE — Assessment & Plan Note (Signed)
History of ischemic cardiomyopathy the recent 2-D echo revealed ejection fraction had fallen from the 40-45% range to the 30-35% range he is on appropriate medications. [name] totally symptomatic from this. I am concerned that he is in a reduced ejection fraction range that would benefit from ICD placement for primary prevention. I'm going to refer him to Dr. Royann Shivers to discuss ICD implantation for primary prevention given his decline in ejection fraction.

## 2016-12-13 ENCOUNTER — Other Ambulatory Visit: Payer: Self-pay | Admitting: Cardiovascular Disease

## 2016-12-13 MED ORDER — CARVEDILOL 12.5 MG PO TABS: 12.5000 mg | ORAL_TABLET | Freq: Two times a day (BID) | ORAL | 3 refills | Status: DC

## 2016-12-13 NOTE — Telephone Encounter (Signed)
Rx(s) sent to pharmacy electronically.  

## 2016-12-25 DIAGNOSIS — Z9189 Other specified personal risk factors, not elsewhere classified: Secondary | ICD-10-CM | POA: Insufficient documentation

## 2016-12-25 NOTE — Progress Notes (Signed)
Cardiology Office Note    Date:  12/26/2016   ID:  Devin Ellis, DOB Oct 28, 1961, MRN [id number]  PCP:  Horald Pollen., PA-C  Cardiologist:  Nanetta Batty, MD; Thurmon Fair, MD   Chief Complaint  Patient presents with  . Follow-up    History of Present Illness:  Devin Ellis is a 56 y.o. male with ischemic CMP (anterior MI 2016, EF 30-35% - last echo 11/06/2016< CHF NYHA class II) here to discuss primary prevention ICD.  [name] on a relatively high dose of carvedilol, but a small dose of ACEi due to low BP. He has a history of hemopericardium requiring surgical window 2 weeks after his 2016 MI. [name] a former heavy smoker (80 pack year, but now smokes 3-4 cigarettes daily). He takes methotrexate for rheumatoid arthritis.Recently weaned himself off prednisone due to concerns about side effects.  Past Medical History:  Diagnosis Date  . CAD in native artery, residual 60 LCX 06/14/2015  . Cardiomyopathy, ischemic, EF by Echo 40-45% 06/14/2015  . Hyperlipidemia LDL goal <70 06/14/2015  . Pericardial effusion 06/26/2015  . Pericardial tamponade 06/26/2015  . Rheumatoid arthritis involving both hands (HCC) 04/2016  . S/P angioplasty with stent, to LAD with Xience DES 06/12/15  06/14/2015  . ST elevation (STEMI) myocardial infarction involving left anterior descending coronary artery (HCC) 06/12/2015  . Tobacco abuse     Past Surgical History:  Procedure Laterality Date  . CARDIAC CATHETERIZATION N/A 06/12/2015   Procedure: Left Heart Cath and Coronary Angiography;  Surgeon: Runell Gess, MD;  Location: [facility] INVASIVE CV LAB;  Service: Cardiovascular;  Laterality: N/A;  . CARDIAC CATHETERIZATION N/A 06/12/2015   Procedure: Coronary Stent Intervention;  Surgeon: Runell Gess, MD;  Location: [facility] INVASIVE CV LAB;  Service: Cardiovascular;  Laterality: N/A;  . SHOULDER SURGERY Right    Torn bicep  . SUBXYPHOID PERICARDIAL WINDOW N/A 06/26/2015   Procedure: SUBXYPHOID PERICARDIAL WINDOW;   Surgeon: Purcell Nails, MD;  Location: [facility] OR;  Service: Thoracic;  Laterality: N/A;    Current Medications: Outpatient Medications Prior to Visit  Medication Sig Dispense Refill  . acetaminophen (TYLENOL) 325 MG tablet Take 2 tablets (650 mg total) by mouth every 4 (four) hours as needed for headache or mild pain.    [name] aspirin EC 81 MG tablet Take 81 mg by mouth daily.    [name] atorvastatin (LIPITOR) 10 MG tablet Take 1 tablet (10 mg total) by mouth daily. 90 tablet 3  . carvedilol (COREG) 12.5 MG tablet Take 1 tablet (12.5 mg total) by mouth 2 (two) times daily. 180 tablet 3  . clopidogrel (PLAVIX) 75 MG tablet Take 1 tablet (75 mg total) by mouth daily. 30 tablet 11  . folic acid (FOLVITE) 1 MG tablet Take 1 mg by mouth daily.  3  . HYDROcodone-acetaminophen (NORCO/VICODIN) 5-325 MG per tablet Take 1 tablet by mouth every 6 (six) hours as needed for moderate pain.    [name] lisinopril (PRINIVIL,ZESTRIL) 2.5 MG tablet Take 1 tablet (2.5 mg total) by mouth daily. 90 tablet 3  . methotrexate (RHEUMATREX) 2.5 MG tablet Take 6 tablets by mouth once a week.    . nitroGLYCERIN (NITROSTAT) 0.4 MG SL tablet Place 1 tablet (0.4 mg total) under the tongue every 5 (five) minutes as needed for chest pain. 25 tablet 4  . predniSONE (DELTASONE) 5 MG tablet Take 5 mg by mouth 2 (two) times daily.  1   No facility-administered medications prior to visit.  Allergies:   Patient has no known allergies.   Social History   Social History  . Marital status: Unknown    Spouse name: N/A  . Number of children: 5  . Years of education: N/A   Occupational History  . Not employed, was in the warehouse at [employer]    Social History Main Topics  . Smoking status: Current Every Day Smoker    Packs/day: 0.25    Years: 30.00    Types: Cigarettes  . Smokeless tobacco: Never Used  . Alcohol use None  . Drug use: Unknown  . Sexual activity: Not Asked   Other Topics Concern  . None   Social History  Narrative   Married.  Five children total between him and his wife.  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       Family History:  The patient's family history includes Diabetes in his father and mother; Hypertension in his mother.   ROS:   Please see the history of present illness.    ROS All other systems reviewed and are negative.   PHYSICAL EXAM:   VS:  BP 110/70   Pulse 66   Ht 6' 1" (1.854 m)   Wt 76.6 kg (168 lb 12.8 oz)   BMI 22.27 kg/m    GEN: Well nourished, well developed, in no acute distress  HEENT: normal  Neck: no JVD, carotid bruits, or masses Cardiac: Occasional ectopy on a background of RRR; no murmurs, rubs, or gallops,no edema  Respiratory:  clear to auscultation bilaterally, normal work of breathing GI: soft, nontender, nondistended, + BS MS: no deformity or atrophy  Skin: warm and dry, no rash Neuro:  Alert and Oriented x 3, Strength and sensation are intact Psych: euthymic mood, full affect  Wt Readings from Last 3 Encounters:  12/26/16 76.6 kg (168 lb 12.8 oz)  11/28/16 76.3 kg (168 lb 3.2 oz)  07/06/16 76.2 kg (168 lb)      Studies/Labs Reviewed:   EKG:  EKG is ordered today.  The ekg ordered today demonstrates Sinus rhythm with occasional PACs and a couple of PVCs, Q waves across the septal anterior leads , borderline QTC at 462 ms  Recent Labs: No results found for requested labs within last 8760 hours.   Lipid Panel    Component Value Date/Time   CHOL 125 06/14/2015 0406   TRIG 119 06/14/2015 0406   HDL 42 06/14/2015 0406   CHOLHDL 3.0 06/14/2015 0406   VLDL 24 06/14/2015 0406   LDLCALC 59 06/14/2015 0406    Additional studies/ records that were reviewed today include:  Notes from Dr. Jonathan Berry    ASSESSMENT:    1. Chronic combined systolic and diastolic CHF (congestive heart failure) (HCC)   2. At risk for sudden cardiac death   3. Coronary artery disease involving native coronary artery of native heart without angina  pectoris   4. Cigarette nicotine dependence without complication   5. Hyperlipidemia LDL goal <70   6. Rheumatoid arthritis involving multiple sites with positive rheumatoid factor (HCC)      PLAN:  In order of problems listed above:  1. CHF: well compensated, euvolemic. Meds limited by BP 2. MADIT-2: This is a young man with good functional status who meets criteria for primary prevention ICD implantation for ischemic cardiomyopathy (Prior myocardial infarction, left ventricular ejection fraction under 35%, heart failure NYHA class II, on comprehensive medical therapy for >3 months). Discussed the purpose of the defibrillator, risks   and benefits, as well as the possible procedural complications, long-term complications, "shock plan", etc. Specifically described risk of pneumothorax, lead perforation, infection, need for repeat surgery, lead dislodgment, small but real risk of death at the time of device implantation 3. CAD: currently angina free 4. Smoking: encourage complete and permanent cessation 5. HLP: at target LDL<70 6. RA: increased risk of infection due to immunosuppresants.    Medication Adjustments/Labs and Tests Ordered: Current medicines are reviewed at length with the patient today.  Concerns regarding medicines are outlined above.  Medication changes, Labs and Tests ordered today are listed in the Patient Instructions below. Patient Instructions  Dr Royann Shivers recommends that you have a Medtronic single chamber ICD implanted. This procedure will be performed at [facility].   You will need to have blood work completed prior to the procedure. The scheduler will give you a time frame when to have this completed.  Cardioverter Defibrillator Implantation An implantable cardioverter defibrillator (ICD) is a small device that is placed under the skin in the chest or abdomen. An ICD consists of a battery, a small computer (pulse generator), and wires (leads) that go into the  heart. An ICD is used to detect and correct two types of dangerous irregular heartbeats (arrhythmias):  A rapid heart rhythm (tachycardia).  An arrhythmia in which the lower chambers of the heart (ventricles) contract in an uncoordinated way (fibrillation). When an ICD detects tachycardia, it sends a low-energy shock to the heart to restore the heartbeat to normal (cardioversion). This signal is usually painless. If cardioversion does not work or if the ICD detects fibrillation, it delivers a high-energy shock to the heart (defibrillation) to restart the heart. This shock may feel like a strong jolt in the chest. Your health care provider may prescribe an ICD if:  You have had an arrhythmia that originated in the ventricles.  Your heart has been damaged by a disease or heart condition. Sometimes, ICDs are programmed to act as a device called a pacemaker. Pacemakers can be used to treat a slow heartbeat (bradycardia) or tachycardia by taking over the heart rate with electrical impulses. Tell a health care provider about:  Any allergies you have.  All medicines you are taking, including vitamins, herbs, eye drops, creams, and over-the-counter medicines.  Any problems you or family members have had with anesthetic medicines.  Any blood disorders you have.  Any surgeries you have had.  Any medical conditions you have.  Whether you are pregnant or may be pregnant. What are the risks? Generally, this is a safe procedure. However, problems may occur, including:  Swelling, bleeding, or bruising.  Infection.  Blood clots.  Damage to other structures or organs, such as nerves, blood vessels, or the heart.  Allergic reactions to medicines used during the procedure. What happens before the procedure? Staying hydrated  Follow instructions from your health care provider about hydration, which may include:  Up to 2 hours before the procedure - you may continue to drink clear liquids,  such as water, clear fruit juice, black coffee, and plain tea. Eating and drinking restrictions  Follow instructions from your health care provider about eating and drinking, which may include:  8 hours before the procedure - stop eating heavy meals or foods such as meat, fried foods, or fatty foods.  6 hours before the procedure - stop eating light meals or foods, such as toast or cereal.  6 hours before the procedure - stop drinking milk or drinks that contain milk.  2 hours before the procedure - stop drinking clear liquids. Medicine  Ask your health care provider about:  Changing or stopping your normal medicines. This is important if you take diabetes medicines or blood thinners.  Taking medicines such as aspirin and ibuprofen. These medicines can thin your blood. Do not take these medicines before your procedure if your doctor tells you not to. Tests   You may have blood tests.  You may have a test to check the electrical signals in your heart (electrocardiogram, ECG).  You may have imaging tests, such as a chest X-ray. General instructions   For 24 hours before the procedure, stop using products that contain nicotine or tobacco, such as cigarettes and e-cigarettes. If you need help quitting, ask your health care provider.  Plan to have someone take you home from the hospital or clinic.  You may be asked to shower with a germ-killing soap. What happens during the procedure?  To reduce your risk of infection:  Your health care team will wash or sanitize their hands.  Your skin will be washed with soap.  Hair may be removed from the surgical area.  Small monitors will be put on your body. They will be used to check your heart, blood pressure, and oxygen level.  An IV tube will be inserted into one of your veins.  You will be given one or more of the following:  A medicine to help you relax (sedative).  A medicine to numb the area (local anesthetic).  A medicine  to make you fall asleep (general anesthetic).  Leads will be guided through a blood vessel into your heart and attached to your heart muscles. Depending on the ICD, the leads may go into one ventricle or they may go into both ventricles and into an upper chamber of the heart. An X-ray machine (fluoroscope) will be usedto help guide the leads.  A small incision will be made to create a deep pocket under your skin.  The pulse generator will be placed into the pocket.  The ICD will be tested.  The incision will be closed with stitches (sutures), skin glue, or staples.  A bandage (dressing) will be placed over the incision. This procedure may vary among health care providers and hospitals. What happens after the procedure?  Your blood pressure, heart rate, breathing rate, and blood oxygen level will be monitored often until the medicines you were given have worn off.  A chest X-ray will be taken to check that the ICD is in the right place.  You will need to stay in the hospital for 1-2 days so your health care provider can make sure your ICD is working.  Do not drive for 24 hours if you received a sedative. Ask your health care provider when it is safe for you to drive.  You may be given an identification card explaining that you have an ICD. Summary  An implantable cardioverter defibrillator (ICD) is a small device that is placed under the skin in the chest or abdomen. It is used to detect and correct dangerous irregular heartbeats (arrhythmias).  An ICD consists of a battery, a small computer (pulse generator), and wires (leads) that go into the heart.  When an ICD detects rapid heart rhythm (tachycardia), it sends a low-energy shock to the heart to restore the heartbeat to normal (cardioversion). If cardioversion does not work or if the ICD detects uncoordinated heart contractions (fibrillation), it delivers a high-energy shock to the heart (defibrillation) to  restart the heart.  You  will need to stay in the hospital for 1-2 days to make sure your ICD is working. This information is not intended to replace advice given to you by your health care provider. Make sure you discuss any questions you have with your health care provider. Document Released: 07/08/2002 Document Revised: 10/25/2016 Document Reviewed: 10/25/2016 Elsevier Interactive Patient Education  [address].     Signed, Thurmon Fair, MD  12/26/2016 8:22 PM    [facility] [address] [place], [place], [place]  [address] Phone: [phone]; Fax: [phone]

## 2016-12-26 ENCOUNTER — Encounter: Payer: Self-pay | Admitting: Cardiovascular Disease

## 2016-12-26 ENCOUNTER — Ambulatory Visit (INDEPENDENT_AMBULATORY_CARE_PROVIDER_SITE_OTHER): Payer: BLUE CROSS/BLUE SHIELD | Admitting: Cardiovascular Disease

## 2016-12-26 VITALS — BP 110/70 | HR 66 | Ht 73.0 in | Wt 168.8 lb

## 2016-12-26 DIAGNOSIS — E785 Hyperlipidemia, unspecified: Secondary | ICD-10-CM

## 2016-12-26 DIAGNOSIS — Z9189 Other specified personal risk factors, not elsewhere classified: Secondary | ICD-10-CM

## 2016-12-26 DIAGNOSIS — M0579 Rheumatoid arthritis with rheumatoid factor of multiple sites without organ or systems involvement: Secondary | ICD-10-CM

## 2016-12-26 DIAGNOSIS — F17210 Nicotine dependence, cigarettes, uncomplicated: Secondary | ICD-10-CM

## 2016-12-26 DIAGNOSIS — I5042 Chronic combined systolic (congestive) and diastolic (congestive) heart failure: Secondary | ICD-10-CM | POA: Diagnosis not present

## 2016-12-26 DIAGNOSIS — I2510 Atherosclerotic heart disease of native coronary artery without angina pectoris: Secondary | ICD-10-CM | POA: Diagnosis not present

## 2016-12-26 NOTE — Patient Instructions (Signed)
Dr Royann Shivers recommends that you have a Medtronic single chamber ICD implanted. This procedure will be performed at [facility].   You will need to have blood work completed prior to the procedure. The scheduler will give you a time frame when to have this completed.  Cardioverter Defibrillator Implantation An implantable cardioverter defibrillator (ICD) is a small device that is placed under the skin in the chest or abdomen. An ICD consists of a battery, a small computer (pulse generator), and wires (leads) that go into the heart. An ICD is used to detect and correct two types of dangerous irregular heartbeats (arrhythmias):  A rapid heart rhythm (tachycardia).  An arrhythmia in which the lower chambers of the heart (ventricles) contract in an uncoordinated way (fibrillation). When an ICD detects tachycardia, it sends a low-energy shock to the heart to restore the heartbeat to normal (cardioversion). This signal is usually painless. If cardioversion does not work or if the ICD detects fibrillation, it delivers a high-energy shock to the heart (defibrillation) to restart the heart. This shock may feel like a strong jolt in the chest. Your health care provider may prescribe an ICD if:  You have had an arrhythmia that originated in the ventricles.  Your heart has been damaged by a disease or heart condition. Sometimes, ICDs are programmed to act as a device called a pacemaker. Pacemakers can be used to treat a slow heartbeat (bradycardia) or tachycardia by taking over the heart rate with electrical impulses. Tell a health care provider about:  Any allergies you have.  All medicines you are taking, including vitamins, herbs, eye drops, creams, and over-the-counter medicines.  Any problems you or family members have had with anesthetic medicines.  Any blood disorders you have.  Any surgeries you have had.  Any medical conditions you have.  Whether you are pregnant or may be  pregnant. What are the risks? Generally, this is a safe procedure. However, problems may occur, including:  Swelling, bleeding, or bruising.  Infection.  Blood clots.  Damage to other structures or organs, such as nerves, blood vessels, or the heart.  Allergic reactions to medicines used during the procedure. What happens before the procedure? Staying hydrated  Follow instructions from your health care provider about hydration, which may include:  Up to 2 hours before the procedure - you may continue to drink clear liquids, such as water, clear fruit juice, black coffee, and plain tea. Eating and drinking restrictions  Follow instructions from your health care provider about eating and drinking, which may include:  8 hours before the procedure - stop eating heavy meals or foods such as meat, fried foods, or fatty foods.  6 hours before the procedure - stop eating light meals or foods, such as toast or cereal.  6 hours before the procedure - stop drinking milk or drinks that contain milk.  2 hours before the procedure - stop drinking clear liquids. Medicine  Ask your health care provider about:  Changing or stopping your normal medicines. This is important if you take diabetes medicines or blood thinners.  Taking medicines such as aspirin and ibuprofen. These medicines can thin your blood. Do not take these medicines before your procedure if your doctor tells you not to. Tests   You may have blood tests.  You may have a test to check the electrical signals in your heart (electrocardiogram, ECG).  You may have imaging tests, such as a chest X-ray. General instructions   For 24 hours before the  procedure, stop using products that contain nicotine or tobacco, such as cigarettes and e-cigarettes. If you need help quitting, ask your health care provider.  Plan to have someone take you home from the hospital or clinic.  You may be asked to shower with a germ-killing  soap. What happens during the procedure?  To reduce your risk of infection:  Your health care team will wash or sanitize their hands.  Your skin will be washed with soap.  Hair may be removed from the surgical area.  Small monitors will be put on your body. They will be used to check your heart, blood pressure, and oxygen level.  An IV tube will be inserted into one of your veins.  You will be given one or more of the following:  A medicine to help you relax (sedative).  A medicine to numb the area (local anesthetic).  A medicine to make you fall asleep (general anesthetic).  Leads will be guided through a blood vessel into your heart and attached to your heart muscles. Depending on the ICD, the leads may go into one ventricle or they may go into both ventricles and into an upper chamber of the heart. An X-ray machine (fluoroscope) will be usedto help guide the leads.  A small incision will be made to create a deep pocket under your skin.  The pulse generator will be placed into the pocket.  The ICD will be tested.  The incision will be closed with stitches (sutures), skin glue, or staples.  A bandage (dressing) will be placed over the incision. This procedure may vary among health care providers and hospitals. What happens after the procedure?  Your blood pressure, heart rate, breathing rate, and blood oxygen level will be monitored often until the medicines you were given have worn off.  A chest X-ray will be taken to check that the ICD is in the right place.  You will need to stay in the hospital for 1-2 days so your health care provider can make sure your ICD is working.  Do not drive for 24 hours if you received a sedative. Ask your health care provider when it is safe for you to drive.  You may be given an identification card explaining that you have an ICD. Summary  An implantable cardioverter defibrillator (ICD) is a small device that is placed under the skin  in the chest or abdomen. It is used to detect and correct dangerous irregular heartbeats (arrhythmias).  An ICD consists of a battery, a small computer (pulse generator), and wires (leads) that go into the heart.  When an ICD detects rapid heart rhythm (tachycardia), it sends a low-energy shock to the heart to restore the heartbeat to normal (cardioversion). If cardioversion does not work or if the ICD detects uncoordinated heart contractions (fibrillation), it delivers a high-energy shock to the heart (defibrillation) to restart the heart.  You will need to stay in the hospital for 1-2 days to make sure your ICD is working. This information is not intended to replace advice given to you by your health care provider. Make sure you discuss any questions you have with your health care provider. Document Released: 07/08/2002 Document Revised: 10/25/2016 Document Reviewed: 10/25/2016 Elsevier Interactive Patient Education  2017 [employer].

## 2017-01-03 LAB — BASIC METABOLIC PANEL
BUN: 12 mg/dL (ref 7–25)
CO2: 30 mmol/L (ref 20–31)
Calcium: 9.6 mg/dL (ref 8.6–10.3)
Chloride: 101 mmol/L (ref 98–110)
Creat: 0.81 mg/dL (ref 0.70–1.33)
Glucose, Bld: 98 mg/dL (ref 65–99)
POTASSIUM: 4.8 mmol/L (ref 3.5–5.3)
SODIUM: 137 mmol/L (ref 135–146)

## 2017-01-03 LAB — CBC
HCT: 48.1 % (ref 38.5–50.0)
HEMOGLOBIN: 16.5 g/dL (ref 13.2–17.1)
MCH: 36.3 pg — ABNORMAL HIGH (ref 27.0–33.0)
MCHC: 34.3 g/dL (ref 32.0–36.0)
MCV: 105.7 fL — ABNORMAL HIGH (ref 80.0–100.0)
MPV: 10.3 fL (ref 7.5–12.5)
Platelets: 259 10*3/uL (ref 140–400)
RBC: 4.55 MIL/uL (ref 4.20–5.80)
RDW: 13.8 % (ref 11.0–15.0)
WBC: 9.3 10*3/uL (ref 3.8–10.8)

## 2017-01-03 LAB — PROTIME-INR
INR: 1.1
PROTHROMBIN TIME: 11.3 s (ref 9.0–11.5)

## 2017-01-03 LAB — TSH: TSH: 2.36 m[IU]/L (ref 0.40–4.50)

## 2017-01-03 LAB — APTT: APTT: 29 s (ref 22–34)

## 2017-01-09 NOTE — Progress Notes (Signed)
ICD Criteria  Current LVEF:30-35%. Within 12 months prior to implant: Yes   Heart failure history: Yes, Class II  Cardiomyopathy history: Yes, Ischemic Cardiomyopathy - Prior MI.  Atrial Fibrillation/Atrial Flutter: No.  Ventricular tachycardia history: No.  Cardiac arrest history: No.  History of syndromes with risk of sudden death: No.  Previous ICD: No.  Current ICD indication: Primary  PPM indication: No.   Class I or II Bradycardia indication present: No  Beta Blocker therapy for 3 or more months: Yes, prescribed.   Ace Inhibitor/ARB therapy for 3 or more months: Yes, prescribed.

## 2017-01-09 NOTE — Progress Notes (Signed)
I have seen Devin Ellis is a 56 y.o. male in the office, referred by Dr. Allyson Sabal for consideration of ICD implant for primary prevention of sudden death.  The patient's chart has been reviewed and they meet criteria for ICD implant.  I have had a thorough discussion with the patient reviewing options.  The patient and his wife have had opportunities to ask questions and had them answered. The patient and I have decided together through a shared decision making process to implant a single chamber ICD at this time.  Risks, benefits, alternatives to ICD implantation were discussed in detail with the patient today. The patient  understands that the risks include but are not limited to bleeding, infection, pneumothorax, perforation, tamponade, vascular damage, renal failure, MI, stroke, death, inappropriate shocks, and lead dislodgement and wishes to proceed.  We have therefore scheduled device implantation for 01/10/17.  Devin Fair, MD, [facility] [phone] office [phone] pager

## 2017-01-10 ENCOUNTER — Encounter (HOSPITAL_COMMUNITY)
Admission: RE | Disposition: A | Discharge status: Home / Self Care | Payer: Self-pay | Source: Ambulatory Visit | Attending: Cardiovascular Disease

## 2017-01-10 ENCOUNTER — Ambulatory Visit (HOSPITAL_COMMUNITY)
Admission: RE | Admit: 2017-01-10 | Discharge: 2017-01-11 | Disposition: A | Discharge status: Home / Self Care | Payer: BLUE CROSS/BLUE SHIELD | Source: Ambulatory Visit | Attending: Cardiovascular Disease | Admitting: Cardiovascular Disease

## 2017-01-10 ENCOUNTER — Encounter (HOSPITAL_COMMUNITY): Payer: Self-pay | Admitting: *Deleted

## 2017-01-10 DIAGNOSIS — Z955 Presence of coronary angioplasty implant and graft: Secondary | ICD-10-CM | POA: Diagnosis not present

## 2017-01-10 DIAGNOSIS — F17210 Nicotine dependence, cigarettes, uncomplicated: Secondary | ICD-10-CM | POA: Diagnosis not present

## 2017-01-10 DIAGNOSIS — Z9189 Other specified personal risk factors, not elsewhere classified: Secondary | ICD-10-CM

## 2017-01-10 DIAGNOSIS — Z833 Family history of diabetes mellitus: Secondary | ICD-10-CM | POA: Diagnosis not present

## 2017-01-10 DIAGNOSIS — I5084 End stage heart failure: Secondary | ICD-10-CM | POA: Insufficient documentation

## 2017-01-10 DIAGNOSIS — Z8249 Family history of ischemic heart disease and other diseases of the circulatory system: Secondary | ICD-10-CM | POA: Insufficient documentation

## 2017-01-10 DIAGNOSIS — Z95810 Presence of automatic (implantable) cardiac defibrillator: Secondary | ICD-10-CM | POA: Diagnosis present

## 2017-01-10 DIAGNOSIS — M069 Rheumatoid arthritis, unspecified: Secondary | ICD-10-CM | POA: Diagnosis not present

## 2017-01-10 DIAGNOSIS — I5042 Chronic combined systolic (congestive) and diastolic (congestive) heart failure: Secondary | ICD-10-CM | POA: Diagnosis present

## 2017-01-10 DIAGNOSIS — I2510 Atherosclerotic heart disease of native coronary artery without angina pectoris: Secondary | ICD-10-CM | POA: Diagnosis present

## 2017-01-10 DIAGNOSIS — I255 Ischemic cardiomyopathy: Secondary | ICD-10-CM | POA: Diagnosis not present

## 2017-01-10 DIAGNOSIS — Z79899 Other long term (current) drug therapy: Secondary | ICD-10-CM | POA: Diagnosis not present

## 2017-01-10 DIAGNOSIS — J449 Chronic obstructive pulmonary disease, unspecified: Secondary | ICD-10-CM | POA: Diagnosis not present

## 2017-01-10 DIAGNOSIS — Z7982 Long term (current) use of aspirin: Secondary | ICD-10-CM | POA: Diagnosis not present

## 2017-01-10 DIAGNOSIS — E785 Hyperlipidemia, unspecified: Secondary | ICD-10-CM | POA: Diagnosis not present

## 2017-01-10 DIAGNOSIS — I252 Old myocardial infarction: Secondary | ICD-10-CM

## 2017-01-10 HISTORY — PX: ICD IMPLANT: EP1208

## 2017-01-10 LAB — SURGICAL PCR SCREEN
MRSA, PCR: NEGATIVE
STAPHYLOCOCCUS AUREUS: POSITIVE — AB

## 2017-01-10 SURGERY — ICD IMPLANT
Anesthesia: LOCAL

## 2017-01-10 MED ORDER — SODIUM CHLORIDE 0.9 % IV SOLN: INTRAVENOUS | Status: DC

## 2017-01-10 MED ORDER — LIDOCAINE HCL (PF) 1 % IJ SOLN
INTRAMUSCULAR | Status: AC
  Filled 2017-01-10: qty 60

## 2017-01-10 MED ORDER — MIDAZOLAM HCL 5 MG/5ML IJ SOLN
INTRAMUSCULAR | Status: DC | PRN
  Administered 2017-01-10: 1 mg via INTRAVENOUS
  Administered 2017-01-10: 2 mg via INTRAVENOUS
  Administered 2017-01-10: 1 mg via INTRAVENOUS

## 2017-01-10 MED ORDER — MIDAZOLAM HCL 5 MG/5ML IJ SOLN
INTRAMUSCULAR | Status: AC
  Filled 2017-01-10: qty 5

## 2017-01-10 MED ORDER — IOPAMIDOL (ISOVUE-370) INJECTION 76%
INTRAVENOUS | Status: DC | PRN
  Administered 2017-01-10: 20 mL via INTRAVENOUS

## 2017-01-10 MED ORDER — CEFAZOLIN SODIUM-DEXTROSE 2-4 GM/100ML-% IV SOLN
2.0000 g | INTRAVENOUS | Status: AC
  Administered 2017-01-10: 2 g via INTRAVENOUS

## 2017-01-10 MED ORDER — ASPIRIN EC 81 MG PO TBEC
81.0000 mg | DELAYED_RELEASE_TABLET | Freq: Every day | ORAL | Status: DC
  Filled 2017-01-10: qty 1

## 2017-01-10 MED ORDER — ACETAMINOPHEN 325 MG PO TABS: 650.0000 mg | ORAL_TABLET | ORAL | Status: DC | PRN

## 2017-01-10 MED ORDER — FENTANYL CITRATE (PF) 100 MCG/2ML IJ SOLN
INTRAMUSCULAR | Status: AC
  Filled 2017-01-10: qty 2

## 2017-01-10 MED ORDER — NITROGLYCERIN 0.4 MG SL SUBL: 0.4000 mg | SUBLINGUAL_TABLET | SUBLINGUAL | Status: DC | PRN

## 2017-01-10 MED ORDER — IOPAMIDOL (ISOVUE-370) INJECTION 76%
INTRAVENOUS | Status: AC
  Filled 2017-01-10: qty 50

## 2017-01-10 MED ORDER — GENTAMICIN SULFATE 40 MG/ML IJ SOLN
80.0000 mg | INTRAMUSCULAR | Status: AC
  Administered 2017-01-10: 80 mg

## 2017-01-10 MED ORDER — CEFAZOLIN SODIUM-DEXTROSE 2-4 GM/100ML-% IV SOLN
INTRAVENOUS | Status: AC
  Filled 2017-01-10: qty 100

## 2017-01-10 MED ORDER — CARVEDILOL 12.5 MG PO TABS
12.5000 mg | ORAL_TABLET | Freq: Two times a day (BID) | ORAL | Status: DC
  Administered 2017-01-10: 12.5 mg via ORAL
  Filled 2017-01-10 (×2): qty 1

## 2017-01-10 MED ORDER — LISINOPRIL 2.5 MG PO TABS
2.5000 mg | ORAL_TABLET | Freq: Every day | ORAL | Status: DC
  Filled 2017-01-10: qty 1

## 2017-01-10 MED ORDER — SODIUM CHLORIDE 0.9 % IV SOLN
INTRAVENOUS | Status: DC
  Administered 2017-01-10: 09:00:00 via INTRAVENOUS

## 2017-01-10 MED ORDER — FOLIC ACID 1 MG PO TABS
1.0000 mg | ORAL_TABLET | Freq: Every day | ORAL | Status: DC
  Filled 2017-01-10: qty 1

## 2017-01-10 MED ORDER — HEPARIN (PORCINE) IN NACL 2-0.9 UNIT/ML-% IJ SOLN
INTRAMUSCULAR | Status: AC
  Filled 2017-01-10: qty 500

## 2017-01-10 MED ORDER — HEPARIN (PORCINE) IN NACL 2-0.9 UNIT/ML-% IJ SOLN
INTRAMUSCULAR | Status: DC | PRN
  Administered 2017-01-10: 1000 mL

## 2017-01-10 MED ORDER — CLOPIDOGREL BISULFATE 75 MG PO TABS
75.0000 mg | ORAL_TABLET | Freq: Every day | ORAL | Status: DC
  Filled 2017-01-10: qty 1

## 2017-01-10 MED ORDER — FENTANYL CITRATE (PF) 100 MCG/2ML IJ SOLN
INTRAMUSCULAR | Status: DC | PRN
  Administered 2017-01-10 (×2): 25 ug via INTRAVENOUS

## 2017-01-10 MED ORDER — MUPIROCIN 2 % EX OINT
1.0000 "application " | TOPICAL_OINTMENT | Freq: Once | CUTANEOUS | Status: AC
  Administered 2017-01-10: 1 via TOPICAL

## 2017-01-10 MED ORDER — MUPIROCIN 2 % EX OINT
TOPICAL_OINTMENT | CUTANEOUS | Status: AC
  Administered 2017-01-10: 1 via TOPICAL
  Filled 2017-01-10: qty 22

## 2017-01-10 MED ORDER — CHLORHEXIDINE GLUCONATE 4 % EX LIQD
60.0000 mL | Freq: Once | CUTANEOUS | Status: DC
  Filled 2017-01-10: qty 60

## 2017-01-10 MED ORDER — ATORVASTATIN CALCIUM 10 MG PO TABS: 10.0000 mg | ORAL_TABLET | Freq: Every day | ORAL | Status: DC

## 2017-01-10 MED ORDER — SODIUM CHLORIDE 0.9 % IR SOLN
Status: AC
  Filled 2017-01-10: qty 2

## 2017-01-10 MED ORDER — CEFAZOLIN IN D5W 1 GM/50ML IV SOLN
1.0000 g | Freq: Four times a day (QID) | INTRAVENOUS | Status: AC
  Administered 2017-01-10 – 2017-01-11 (×3): 1 g via INTRAVENOUS
  Filled 2017-01-10 (×3): qty 50

## 2017-01-10 MED ORDER — LIDOCAINE HCL (PF) 1 % IJ SOLN
INTRAMUSCULAR | Status: DC | PRN
  Administered 2017-01-10: 40 mL

## 2017-01-10 MED ORDER — ONDANSETRON HCL 4 MG/2ML IJ SOLN: 4.0000 mg | Freq: Four times a day (QID) | INTRAMUSCULAR | Status: DC | PRN

## 2017-01-10 MED ORDER — HYDROCODONE-ACETAMINOPHEN 5-325 MG PO TABS
2.0000 | ORAL_TABLET | Freq: Every day | ORAL | Status: DC | PRN
  Administered 2017-01-10 – 2017-01-11 (×2): 2 via ORAL
  Filled 2017-01-10 (×2): qty 2

## 2017-01-10 SURGICAL SUPPLY — 7 items
CABLE SURGICAL S-101-97-12 (CABLE) ×2 IMPLANT
ICD VISIA MRI VR DVFB1D4 (ICD Generator) ×1 IMPLANT
LEAD SPRINT QUAT SEC 6935M-62 (Lead) ×2 IMPLANT
PAD DEFIB LIFELINK (PAD) ×2 IMPLANT
SHEATH CLASSIC 9F (SHEATH) ×2 IMPLANT
TRAY PACEMAKER INSERTION (PACKS) ×2 IMPLANT
VISIA MRI VR DVFB1D4 (ICD Generator) ×2 IMPLANT

## 2017-01-10 NOTE — Progress Notes (Signed)
Report and care transferred to [facility]. Patient continue to rest comfortably. No changes to Lt arm site.

## 2017-01-10 NOTE — Op Note (Signed)
Procedure report  Procedure performed:  1. Implantation of new single  chamber cardioverter defibrillator 2. Fluoroscopy 3. Moderate sedation 4. Left upper extremity venography  Reason for procedure: Primary prevention of sudden cardiac death Ischemic cardiomyopathy, left ventricular ejection fraction less than 30%, Heart failure NYHA class 2, on comprehensive medical therapy for over 90 days (MADIT-II)  Procedure performed by: Thurmon Fair, MD  Complications: None  Estimated blood loss: <10 mL  Medications administered during procedure: Ancef 2 g intravenously Lidocaine 1% 30 mL locally,  Fentanyl 50 mcg intravenously Versed 4 mg intravenously Omnipaque 18 mL IV  During this procedure the patient is administered a total of Versed 4 mg and Fentanyl 50 mcg to achieve and maintain moderate conscious sedation.  The patient's heart rate, blood pressure, and oxygen saturation are monitored continuously during the procedure. The period of conscious sedation is 71 minutes, of which I was present face-to-face 100% of this time.   Device details:  Generator Medtronic Visia AF MRI model [id number] serial number [id number] Right ventricular lead Medtronic [id number] serial number [id number]  Procedure details:  After the risks and benefits of the procedure were discussed the patient provided informed consent and was brought to the cardiac cath lab in the fasting state. The patient was prepped and draped in usual sterile fashion. Local anesthesia with 1% lidocaine was administered to to the left infraclavicular area. A 5-6 cm horizontal incision was made parallel with and 2-3 cm caudal to the left clavicle. Using electrocautery and blunt dissection a prepectoral pocket was created down to the level of the pectoralis major muscle fascia. The pocket was carefully inspected for hemostasis. An antibiotic-soaked sponge was placed in the pocket.  Under fluoroscopic guidance and using the modified  Seldinger technique a single venipuncture was performed to access the left subclavian vein. Considerable difficulty was encountered accessing the vein and a venogram was performed. A J-tip guidewire was subsequently exchanged for a 9.5 [place] safe sheaths.  Under fluoroscopic guidance the ventricular lead was advanced to level of the mid to apical right ventricular septum and thet active-fixation helix was deployed. Multiple locations were tried due to poor R waves. Eventually an adequate location was identified. Prominent current of injury was seen. Satisfactory pacing and sensing parameters were recorded. There was no evidence of diaphragmatic stimulation at maximum device output. The safe sheath was peeled away and the lead was secured in place with 2-0 silk.  The antibiotic-soaked sponge was removed from the pocket. The pocket was flushed with copious amounts of antibiotic solution. Reinspection showed good hemostasis.  The ventricular lead was connected to the generator and appropriate ventricular pacing was seen. Repeat testing of the lead parameters via telemetry showed excellent values.  The entire system was then carefully inserted in the pocket with care been taking that the leads and device assumed a comfortable position without pressure on the incision. Great care was taken that the leads be located deep to the generator. The pocket was then closed in layers using 2 layers of 2-0 Vicryl and cutaneous staples, after which a sterile dressing was applied.  Defibrillation threshold testing was not performed.   At the end of the procedure the following lead parameters were encountered:  Right ventricular lead sensed R waves 7.5 mV, impedance 701 ohms, threshold 1 V at 0.4 ms pulse width.  High voltage impedance 70 ohm.  Thurmon Fair, MD, [facility] [phone] office [phone] pager

## 2017-01-10 NOTE — Progress Notes (Signed)
Report and care received from Amy Terrell,RCIS. Patient resting comfortably at this time. Lt Upper arm site soft with no active bleeding or hematoma noted. Will continue to monitor patient

## 2017-01-10 NOTE — Interval H&P Note (Signed)
History and Physical Interval Note:  01/10/2017 9:57 AM  Devin Ellis  has presented today for surgery, with the diagnosis of cardiomyopathy  The various methods of treatment have been discussed with the patient and family. After consideration of risks, benefits and other options for treatment, the patient has consented to  Procedure(s): ICD Implant (N/A) as a surgical intervention .  The patient's history has been reviewed, patient examined, no change in status, stable for surgery.  I have reviewed the patient's chart and labs.  Questions were answered to the patient's satisfaction.     Evanny Ellerbe

## 2017-01-10 NOTE — H&P (View-Only) (Signed)
Cardiology Office Note    Date:  12/26/2016   ID:  Devin Ellis, DOB Oct 28, 1961, MRN [id number]  PCP:  Horald Pollen., PA-C  Cardiologist:  Nanetta Batty, MD; Thurmon Fair, MD   Chief Complaint  Patient presents with  . Follow-up    History of Present Illness:  Devin Ellis is a 56 y.o. male with ischemic CMP (anterior MI 2016, EF 30-35% - last echo 11/06/2016< CHF NYHA class II) here to discuss primary prevention ICD.  [name] on a relatively high dose of carvedilol, but a small dose of ACEi due to low BP. He has a history of hemopericardium requiring surgical window 2 weeks after his 2016 MI. [name] a former heavy smoker (80 pack year, but now smokes 3-4 cigarettes daily). He takes methotrexate for rheumatoid arthritis.Recently weaned himself off prednisone due to concerns about side effects.  Past Medical History:  Diagnosis Date  . CAD in native artery, residual 60 LCX 06/14/2015  . Cardiomyopathy, ischemic, EF by Echo 40-45% 06/14/2015  . Hyperlipidemia LDL goal <70 06/14/2015  . Pericardial effusion 06/26/2015  . Pericardial tamponade 06/26/2015  . Rheumatoid arthritis involving both hands (HCC) 04/2016  . S/P angioplasty with stent, to LAD with Xience DES 06/12/15  06/14/2015  . ST elevation (STEMI) myocardial infarction involving left anterior descending coronary artery (HCC) 06/12/2015  . Tobacco abuse     Past Surgical History:  Procedure Laterality Date  . CARDIAC CATHETERIZATION N/A 06/12/2015   Procedure: Left Heart Cath and Coronary Angiography;  Surgeon: Runell Gess, MD;  Location: [facility] INVASIVE CV LAB;  Service: Cardiovascular;  Laterality: N/A;  . CARDIAC CATHETERIZATION N/A 06/12/2015   Procedure: Coronary Stent Intervention;  Surgeon: Runell Gess, MD;  Location: [facility] INVASIVE CV LAB;  Service: Cardiovascular;  Laterality: N/A;  . SHOULDER SURGERY Right    Torn bicep  . SUBXYPHOID PERICARDIAL WINDOW N/A 06/26/2015   Procedure: SUBXYPHOID PERICARDIAL WINDOW;   Surgeon: Purcell Nails, MD;  Location: [facility] OR;  Service: Thoracic;  Laterality: N/A;    Current Medications: Outpatient Medications Prior to Visit  Medication Sig Dispense Refill  . acetaminophen (TYLENOL) 325 MG tablet Take 2 tablets (650 mg total) by mouth every 4 (four) hours as needed for headache or mild pain.    [name] aspirin EC 81 MG tablet Take 81 mg by mouth daily.    [name] atorvastatin (LIPITOR) 10 MG tablet Take 1 tablet (10 mg total) by mouth daily. 90 tablet 3  . carvedilol (COREG) 12.5 MG tablet Take 1 tablet (12.5 mg total) by mouth 2 (two) times daily. 180 tablet 3  . clopidogrel (PLAVIX) 75 MG tablet Take 1 tablet (75 mg total) by mouth daily. 30 tablet 11  . folic acid (FOLVITE) 1 MG tablet Take 1 mg by mouth daily.  3  . HYDROcodone-acetaminophen (NORCO/VICODIN) 5-325 MG per tablet Take 1 tablet by mouth every 6 (six) hours as needed for moderate pain.    [name] lisinopril (PRINIVIL,ZESTRIL) 2.5 MG tablet Take 1 tablet (2.5 mg total) by mouth daily. 90 tablet 3  . methotrexate (RHEUMATREX) 2.5 MG tablet Take 6 tablets by mouth once a week.    . nitroGLYCERIN (NITROSTAT) 0.4 MG SL tablet Place 1 tablet (0.4 mg total) under the tongue every 5 (five) minutes as needed for chest pain. 25 tablet 4  . predniSONE (DELTASONE) 5 MG tablet Take 5 mg by mouth 2 (two) times daily.  1   No facility-administered medications prior to visit.  Allergies:   Patient has no known allergies.   Social History   Social History  . Marital status: Unknown    Spouse name: N/A  . Number of children: 5  . Years of education: N/A   Occupational History  . Not employed, was in the warehouse at [employer]    Social History Main Topics  . Smoking status: Current Every Day Smoker    Packs/day: 0.25    Years: 30.00    Types: Cigarettes  . Smokeless tobacco: Never Used  . Alcohol use None  . Drug use: Unknown  . Sexual activity: Not Asked   Other Topics Concern  . None   Social History  Narrative   Married.  Five children total between him and his wife.  He works for [employer] in [employer].       Family History:  The patient's family history includes Diabetes in his father and mother; Hypertension in his mother.   ROS:   Please see the history of present illness.    ROS All other systems reviewed and are negative.   PHYSICAL EXAM:   VS:  BP 110/70   Pulse 66   Ht 6\' 1"  (1.854 m)   Wt 76.6 kg (168 lb 12.8 oz)   BMI 22.27 kg/m    GEN: Well nourished, well developed, in no acute distress  HEENT: normal  Neck: no JVD, carotid bruits, or masses Cardiac: Occasional ectopy on a background of RRR; no murmurs, rubs, or gallops,no edema  Respiratory:  clear to auscultation bilaterally, normal work of breathing GI: soft, nontender, nondistended, + BS MS: no deformity or atrophy  Skin: warm and dry, no rash Neuro:  Alert and Oriented x 3, Strength and sensation are intact Psych: euthymic mood, full affect  Wt Readings from Last 3 Encounters:  12/26/16 76.6 kg (168 lb 12.8 oz)  11/28/16 76.3 kg (168 lb 3.2 oz)  07/06/16 76.2 kg (168 lb)      Studies/Labs Reviewed:   EKG:  EKG is ordered today.  The ekg ordered today demonstrates Sinus rhythm with occasional PACs and a couple of PVCs, Q waves across the septal anterior leads , borderline QTC at 462 ms  Recent Labs: No results found for requested labs within last 8760 hours.   Lipid Panel    Component Value Date/Time   CHOL 125 06/14/2015 0406   TRIG 119 06/14/2015 0406   HDL 42 06/14/2015 0406   CHOLHDL 3.0 06/14/2015 0406   VLDL 24 06/14/2015 0406   LDLCALC 59 06/14/2015 0406    Additional studies/ records that were reviewed today include:  Notes from Dr. Nanetta Batty    ASSESSMENT:    1. Chronic combined systolic and diastolic CHF (congestive heart failure) (HCC)   2. At risk for sudden cardiac death   3. Coronary artery disease involving native coronary artery of native heart without angina  pectoris   4. Cigarette nicotine dependence without complication   5. Hyperlipidemia LDL goal <70   6. Rheumatoid arthritis involving multiple sites with positive rheumatoid factor (HCC)      PLAN:  In order of problems listed above:  1. CHF: well compensated, euvolemic. Meds limited by BP 2. MADIT-2: This is a young man with good functional status who meets criteria for primary prevention ICD implantation for ischemic cardiomyopathy (Prior myocardial infarction, left ventricular ejection fraction under 35%, heart failure NYHA class II, on comprehensive medical therapy for >3 months). Discussed the purpose of the defibrillator, risks  and benefits, as well as the possible procedural complications, long-term complications, "shock plan", etc. Specifically described risk of pneumothorax, lead perforation, infection, need for repeat surgery, lead dislodgment, small but real risk of death at the time of device implantation 3. CAD: currently angina free 4. Smoking: encourage complete and permanent cessation 5. HLP: at target LDL<70 6. RA: increased risk of infection due to immunosuppresants.    Medication Adjustments/Labs and Tests Ordered: Current medicines are reviewed at length with the patient today.  Concerns regarding medicines are outlined above.  Medication changes, Labs and Tests ordered today are listed in the Patient Instructions below. Patient Instructions  Dr Royann Shivers recommends that you have a Medtronic single chamber ICD implanted. This procedure will be performed at [facility].   You will need to have blood work completed prior to the procedure. The scheduler will give you a time frame when to have this completed.  Cardioverter Defibrillator Implantation An implantable cardioverter defibrillator (ICD) is a small device that is placed under the skin in the chest or abdomen. An ICD consists of a battery, a small computer (pulse generator), and wires (leads) that go into the  heart. An ICD is used to detect and correct two types of dangerous irregular heartbeats (arrhythmias):  A rapid heart rhythm (tachycardia).  An arrhythmia in which the lower chambers of the heart (ventricles) contract in an uncoordinated way (fibrillation). When an ICD detects tachycardia, it sends a low-energy shock to the heart to restore the heartbeat to normal (cardioversion). This signal is usually painless. If cardioversion does not work or if the ICD detects fibrillation, it delivers a high-energy shock to the heart (defibrillation) to restart the heart. This shock may feel like a strong jolt in the chest. Your health care provider may prescribe an ICD if:  You have had an arrhythmia that originated in the ventricles.  Your heart has been damaged by a disease or heart condition. Sometimes, ICDs are programmed to act as a device called a pacemaker. Pacemakers can be used to treat a slow heartbeat (bradycardia) or tachycardia by taking over the heart rate with electrical impulses. Tell a health care provider about:  Any allergies you have.  All medicines you are taking, including vitamins, herbs, eye drops, creams, and over-the-counter medicines.  Any problems you or family members have had with anesthetic medicines.  Any blood disorders you have.  Any surgeries you have had.  Any medical conditions you have.  Whether you are pregnant or may be pregnant. What are the risks? Generally, this is a safe procedure. However, problems may occur, including:  Swelling, bleeding, or bruising.  Infection.  Blood clots.  Damage to other structures or organs, such as nerves, blood vessels, or the heart.  Allergic reactions to medicines used during the procedure. What happens before the procedure? Staying hydrated  Follow instructions from your health care provider about hydration, which may include:  Up to 2 hours before the procedure - you may continue to drink clear liquids,  such as water, clear fruit juice, black coffee, and plain tea. Eating and drinking restrictions  Follow instructions from your health care provider about eating and drinking, which may include:  8 hours before the procedure - stop eating heavy meals or foods such as meat, fried foods, or fatty foods.  6 hours before the procedure - stop eating light meals or foods, such as toast or cereal.  6 hours before the procedure - stop drinking milk or drinks that contain milk.  2 hours before the procedure - stop drinking clear liquids. Medicine  Ask your health care provider about:  Changing or stopping your normal medicines. This is important if you take diabetes medicines or blood thinners.  Taking medicines such as aspirin and ibuprofen. These medicines can thin your blood. Do not take these medicines before your procedure if your doctor tells you not to. Tests   You may have blood tests.  You may have a test to check the electrical signals in your heart (electrocardiogram, ECG).  You may have imaging tests, such as a chest X-ray. General instructions   For 24 hours before the procedure, stop using products that contain nicotine or tobacco, such as cigarettes and e-cigarettes. If you need help quitting, ask your health care provider.  Plan to have someone take you home from the hospital or clinic.  You may be asked to shower with a germ-killing soap. What happens during the procedure?  To reduce your risk of infection:  Your health care team will wash or sanitize their hands.  Your skin will be washed with soap.  Hair may be removed from the surgical area.  Small monitors will be put on your body. They will be used to check your heart, blood pressure, and oxygen level.  An IV tube will be inserted into one of your veins.  You will be given one or more of the following:  A medicine to help you relax (sedative).  A medicine to numb the area (local anesthetic).  A medicine  to make you fall asleep (general anesthetic).  Leads will be guided through a blood vessel into your heart and attached to your heart muscles. Depending on the ICD, the leads may go into one ventricle or they may go into both ventricles and into an upper chamber of the heart. An X-ray machine (fluoroscope) will be usedto help guide the leads.  A small incision will be made to create a deep pocket under your skin.  The pulse generator will be placed into the pocket.  The ICD will be tested.  The incision will be closed with stitches (sutures), skin glue, or staples.  A bandage (dressing) will be placed over the incision. This procedure may vary among health care providers and hospitals. What happens after the procedure?  Your blood pressure, heart rate, breathing rate, and blood oxygen level will be monitored often until the medicines you were given have worn off.  A chest X-ray will be taken to check that the ICD is in the right place.  You will need to stay in the hospital for 1-2 days so your health care provider can make sure your ICD is working.  Do not drive for 24 hours if you received a sedative. Ask your health care provider when it is safe for you to drive.  You may be given an identification card explaining that you have an ICD. Summary  An implantable cardioverter defibrillator (ICD) is a small device that is placed under the skin in the chest or abdomen. It is used to detect and correct dangerous irregular heartbeats (arrhythmias).  An ICD consists of a battery, a small computer (pulse generator), and wires (leads) that go into the heart.  When an ICD detects rapid heart rhythm (tachycardia), it sends a low-energy shock to the heart to restore the heartbeat to normal (cardioversion). If cardioversion does not work or if the ICD detects uncoordinated heart contractions (fibrillation), it delivers a high-energy shock to the heart (defibrillation) to  restart the heart.  You  will need to stay in the hospital for 1-2 days to make sure your ICD is working. This information is not intended to replace advice given to you by your health care provider. Make sure you discuss any questions you have with your health care provider. Document Released: 07/08/2002 Document Revised: 10/25/2016 Document Reviewed: 10/25/2016 Elsevier Interactive Patient Education  [address].     Signed, Thurmon Fair, MD  12/26/2016 8:22 PM    [facility] [address] [place], [place], [place]  [address] Phone: [phone]; Fax: [phone]

## 2017-01-11 ENCOUNTER — Ambulatory Visit (HOSPITAL_COMMUNITY): Payer: BLUE CROSS/BLUE SHIELD

## 2017-01-11 ENCOUNTER — Encounter (HOSPITAL_COMMUNITY): Payer: Self-pay

## 2017-01-11 DIAGNOSIS — I255 Ischemic cardiomyopathy: Secondary | ICD-10-CM | POA: Diagnosis not present

## 2017-01-11 NOTE — Progress Notes (Signed)
   Progress Note  Patient Name: Devin Ellis Date of Encounter: 01/11/2017  Primary Cardiologist: Allyson Sabal Kali Deadwyler  Subjective   No discomfort at surgical site, feels well  Inpatient Medications    Scheduled Meds: . aspirin EC  81 mg Oral Daily  . atorvastatin  10 mg Oral q1800  . carvedilol  12.5 mg Oral BID WC  . clopidogrel  75 mg Oral Daily  . folic acid  1 mg Oral Daily  . lisinopril  2.5 mg Oral Daily   Continuous Infusions: . sodium chloride 50 mL/hr at 01/10/17 1345   PRN Meds: acetaminophen, HYDROcodone-acetaminophen, nitroGLYCERIN, ondansetron (ZOFRAN) IV   Vital Signs    Vitals:   01/10/17 1310 01/10/17 1345 01/10/17 1635 01/11/17 0620  BP:  105/67 123/80 112/71  Pulse: 69 69 64 63  Resp: 13 15 16    Temp:  97.9 F (36.6 C) 97.8 F (36.6 C) 98 F (36.7 C)  TempSrc:  Oral Oral Oral  SpO2: 97%  98% 97%  Weight:    77 kg (169 lb 11.2 oz)  Height:        Intake/Output Summary (Last 24 hours) at 01/11/17 0944 Last data filed at 01/11/17 0600  Gross per 24 hour  Intake           1262.5 ml  Output                0 ml  Net           1262.5 ml   Filed Weights   01/10/17 0829 01/11/17 0620  Weight: 76.2 kg (168 lb) 77 kg (169 lb 11.2 oz)    Telemetry    NSR - Personally Reviewed  ECG    NSR, extensive old anteroseptal MI - Personally Reviewed  Physical Exam  Comfortable GEN: No acute distress.   Neck: No JVD Cardiac: RRR, no murmurs, rubs, or gallops. Medium size superficial ecchymosis L subclavian, no hematoma Respiratory: Clear to auscultation bilaterally. GI: Soft, nontender, non-distended  MS: No edema; No deformity. Neuro:  Nonfocal  Psych: Normal affect    Radiology    Dg Chest 2 View  Result Date: 01/11/2017 CLINICAL DATA:  Status post pacemaker placement. Chest soreness at the generator site. No other complaints. History of CHF. Current smoker. EXAM: CHEST  2 VIEW COMPARISON:  Chest x-ray of July 26, 2015 FINDINGS: The lungs  are hyperinflated. There is no pneumothorax or pleural effusion. The interstitial markings are coarse. The heart and pulmonary vascularity are normal. The ICD is in reasonable position radiographically. The mediastinum is normal in width. The bony thorax exhibits no acute abnormality. IMPRESSION: COPD with chronic fibrotic changes, stable. No postprocedure pneumothorax or hemothorax nor other acute cardiopulmonary abnormality. Electronically Signed   By: David  July 28, 2015 M.D.   On: 01/11/2017 07:33    Cardiac Studies   ICD check shows good RV lead impedance and pacing threshold (0.5V@0 .4 ms), but bipolar sensing is mediocre (R wave 2.5 mV). There was a lot of difficulty identifying a location with good sensing despite multiple locations tested. Switched to tip-RV coil sensing, R waves 10 mV.  Patient Profile     56 y.o. male day #1 s/p elective single chamber ICD implantation for primary prevention in setting of severe ischemic CMP.  Assessment & Plan    DC home. Wound check 7-10 days. F/U with me in 2-3 months. Wound care and activity restrictions discussed.  Signed, 42, MD  01/11/2017, 9:44 AM

## 2017-01-11 NOTE — Discharge Instructions (Signed)
Supplemental Discharge Instructions for  Pacemaker/Defibrillator Patients  Activity Do not raise your left/right arm above shoulder level or extend it backward beyond shoulder level for 2 weeks. Wear the arm sling as a reminder or as needed for comfort for 2 weeks. No heavy lifting or vigorous activity with your left/right arm for 6-8 weeks.    NO DRIVING is preferable for 2 weeks; If absolutely necessary, drive only short, familiar routes. DO wear your seatbelt, even if it crosses over the pacemaker site.  WOUND CARE - Keep the wound area clean and dry.  Remove the dressing the day after you return home (usually 48 hours after the procedure). - DO NOT SUBMERGE UNDER WATER UNTIL FULLY HEALED (no tub baths, hot tubs, swimming pools, etc.).  - You  may shower or take a sponge bath after the dressing is removed. DO NOT SOAK the area and do not allow the shower to directly spray on the site. - If you have staples, these will be removed in the office in 7-14 days. - If you have tape/steri-strips on your wound, these will fall off; do not pull them off prematurely.   - No bandage is needed on the site.  DO  NOT apply any creams, oils, or ointments to the wound area. - If you notice any drainage or discharge from the wound, any swelling, excessive redness or bruising at the site, or if you develop a fever > 101? F after you are discharged home, call the office at once.  Special Instructions - You are still able to use cellular telephones.  Avoid carrying your cellular phone near your device. - When traveling through airports, show security personnel your identification card to avoid being screened in the metal detectors.  - Avoid arc welding equipment, MRI testing (magnetic resonance imaging), TENS units (transcutaneous nerve stimulators).  Call the office for questions about other devices. - Avoid electrical appliances that are in poor condition or are not properly grounded. - Microwave ovens are  safe to be near or to operate.  Additional information for defibrillator patients should your device go off: - If your device goes off ONCE and you feel fine afterward, notify the clinic at [phone]. - If your device goes off ONCE and you do not feel well afterward, call 911. - If your device goes off TWICE or more in one day, call 911.  DO NOT DRIVE YOURSELF OR A FAMILY MEMBER WITH A DEFIBRILLATOR TO THE HOSPITAL--CALL 911.  HEART HEALTHY LOW SALT DIET

## 2017-01-11 NOTE — Progress Notes (Signed)
Pt stated his wife brought medications this morning and he took his home medication this morning. Pt told about the importance of taking our medication.

## 2017-01-11 NOTE — Plan of Care (Signed)
Problem: Education: Goal: Knowledge of [facility] Education information/materials will improve Outcome: Progressing Reviewed D/C instructions for care of pacemaker incision care and limitations. Pt. Verbalized understanding.

## 2017-01-11 NOTE — Progress Notes (Signed)
Discharge instructions reviewed with pt. Pt has no questions at this time. IV d/c. Pt wheeled down stairs. Going home with wife.

## 2017-01-11 NOTE — Discharge Summary (Signed)
Discharge Summary    Patient ID: Devin Ellis,  MRN: [id number], DOB/AGE: Nov 08, 1960 56 y.o.  Admit date: 01/10/2017 Discharge date: 01/11/2017  Primary Care Provider: Helene Kelp C. Primary Cardiologist:Dr. Allyson Sabal  Dr. Royann Shivers  Discharge Diagnoses    Principal Problem:   At risk for sudden cardiac death Active Problems:   ICD (implantable cardioverter-defibrillator) in place, placed 01/10/17 MTD   CAD (coronary artery disease)   Chronic combined systolic and diastolic CHF (congestive heart failure) (HCC)   History of acute anterior wall myocardial infarction   Allergies No Known Allergies  Diagnostic Studies/Procedures    01/10/17 by Dr. Judie Petit. Croitoru  Cardiac cath: Conclusion   1. Implantation of new single  chamber cardioverter defibrillator 2. Fluoroscopy 3. Moderate sedation 4. Left upper extremity venography  Reason for procedure: Primary prevention of sudden cardiac death Ischemic cardiomyopathy, left ventricular ejection fraction less than 30%, Heart failure NYHA class 2, on comprehensive medical therapy for over 90 days (MADIT-II)    Device details:  Generator Medtronic Visia AF MRI model [id number] serial number [id number] Right ventricular lead Medtronic [id number] serial number [id number]  Right ventricular lead sensed R waves 7.5 mV, impedance 701 ohms, threshold 1 V at 0.4 ms pulse width.  High voltage impedance 70 ohm.  _____________   History of Present Illness     56 y.o. male with ischemic CMP (anterior MI 2016, EF 30-35% - last echo 11/06/2016< CHF NYHA class II) was seen by Dr. Judie Petit. Croitoru discuss primary prevention ICD.  [name] MADIT-2: This is a young man with good functional status who meets criteria for primary prevention ICD implantation for ischemic cardiomyopathy (Prior myocardial infarction, left ventricular ejection fraction under 35%, heart failure NYHA class II, on comprehensive medical therapy for >3 months). Discussed the purpose  of the defibrillator, risks and benefits, as well as the possible procedural complications, long-term complications, "shock plan", etc. Specifically described risk of pneumothorax, lead perforation, infection, need for repeat surgery, lead dislodgment, small but real risk of death at the time of device implantation   [name] on a relatively high dose of carvedilol, but a small dose of ACEi due to low BP. He has a history of hemopericardium requiring surgical window 2 weeks after his 2016 MI. [name] a former heavy smoker (80 pack year, but now smokes 3-4 cigarettes daily). He takes methotrexate for rheumatoid arthritis.Recently weaned himself off prednisone due to concerns about side effects.  Presented 01/10/17 and underwent procedure as above without complications.   Hospital Course     Consultants: none   Today he has been seen by Dr. Judie Petit. Croitoru and found stable for discharge.   His Medtronic device was interrogated and settings were stable.  2V CXR with no pneumothorax.  Site is stable no hematoma. Ambulated and discharged with follow ups as below.   _____________  Discharge Vitals Blood pressure 106/70, pulse 61, temperature 98 F (36.7 C), temperature source Oral, resp. rate 16, height 6\' 1"  (1.854 m), weight 169 lb 11.2 oz (77 kg), SpO2 97 %.  Filed Weights   01/10/17 0829 01/11/17 0620  Weight: 168 lb (76.2 kg) 169 lb 11.2 oz (77 kg)   General:Pleasant affect, NAD Heart:S1S2 RRR without murmur, gallup, rub or click Lungs:clear without rales, rhonchi, or wheezes 01/13/17, non tender, + BS, do not palpate liver spleen or masses Ext:no lower ext edema, 2+ pedal pulses, 2+ radial pulses Neuro:alert and oriented, MAE, follows commands, + facial symmetry  Labs & Radiologic Studies  CBC No results for input(s): WBC, NEUTROABS, HGB, HCT, MCV, PLT in the last 72 hours. Basic Metabolic Panel No results for input(s): NA, K, CL, CO2, GLUCOSE, BUN, CREATININE, CALCIUM, MG, PHOS in the  last 72 hours. Liver Function Tests No results for input(s): AST, ALT, ALKPHOS, BILITOT, PROT, ALBUMIN in the last 72 hours. No results for input(s): LIPASE, AMYLASE in the last 72 hours. Cardiac Enzymes No results for input(s): CKTOTAL, CKMB, CKMBINDEX, TROPONINI in the last 72 hours. BNP Invalid input(s): POCBNP D-Dimer No results for input(s): DDIMER in the last 72 hours. Hemoglobin A1C No results for input(s): HGBA1C in the last 72 hours. Fasting Lipid Panel No results for input(s): CHOL, HDL, LDLCALC, TRIG, CHOLHDL, LDLDIRECT in the last 72 hours. Thyroid Function Tests No results for input(s): TSH, T4TOTAL, T3FREE, THYROIDAB in the last 72 hours.  Invalid input(s): FREET3 _____________  Dg Chest 2 View  Result Date: 01/11/2017 CLINICAL DATA:  Status post pacemaker placement. Chest soreness at the generator site. No other complaints. History of CHF. Current smoker. EXAM: CHEST  2 VIEW COMPARISON:  Chest x-ray of July 26, 2015 FINDINGS: The lungs are hyperinflated. There is no pneumothorax or pleural effusion. The interstitial markings are coarse. The heart and pulmonary vascularity are normal. The ICD is in reasonable position radiographically. The mediastinum is normal in width. The bony thorax exhibits no acute abnormality. IMPRESSION: COPD with chronic fibrotic changes, stable. No postprocedure pneumothorax or hemothorax nor other acute cardiopulmonary abnormality. Electronically Signed   By: David  [place] M.D.   On: 01/11/2017 07:33   Disposition   Pt is being discharged home today in good condition.  Follow-up Plans & Appointments   Pacer/ICD discharge orders were given   Follow-up Information    [facility] Office Follow up on 01/23/2017.   Specialty:  Cardiology Why:  at 9:00AM  for wound check of ICD site.  Contact information: [address], [address] [place] [address] [phone]       Thurmon Fair, MD Follow up on  03/28/2017.   Specialty:  Cardiology Why:  at 8:20 AM Contact information: [address] [place] [address] [phone]            Discharge Medications   Current Discharge Medication List    CONTINUE these medications which have NOT CHANGED   Details  acetaminophen (TYLENOL) 325 MG tablet Take 2 tablets (650 mg total) by mouth every 4 (four) hours as needed for headache or mild pain.    aspirin EC 81 MG tablet Take 81 mg by mouth daily.    atorvastatin (LIPITOR) 10 MG tablet Take 1 tablet (10 mg total) by mouth daily. Qty: 90 tablet, Refills: 3    carvedilol (COREG) 12.5 MG tablet Take 1 tablet (12.5 mg total) by mouth 2 (two) times daily. Qty: 180 tablet, Refills: 3    clopidogrel (PLAVIX) 75 MG tablet Take 1 tablet (75 mg total) by mouth daily. Qty: 30 tablet, Refills: 11    folic acid (FOLVITE) 1 MG tablet Take 1 mg by mouth daily. Refills: 3    golimumab (SIMPONI ARIA) 50 MG/4ML SOLN injection Inject 50 mg into the vein every 8 (eight) weeks.     HYDROcodone-acetaminophen (NORCO/VICODIN) 5-325 MG per tablet Take 2 tablets by mouth at bedtime.     lisinopril (PRINIVIL,ZESTRIL) 2.5 MG tablet Take 1 tablet (2.5 mg total) by mouth daily. Qty: 90 tablet, Refills: 3    methotrexate (RHEUMATREX) 2.5 MG tablet 7.5mg   in the morning and 7.5mg s at night on saturdays    nitroGLYCERIN (NITROSTAT) 0.4 MG SL tablet Place 1 tablet (0.4 mg total) under the tongue every 5 (five) minutes as needed for chest pain. Qty: 25 tablet, Refills: 4          Outstanding Labs/Studies   Device check   Duration of Discharge Encounter   Greater than 30 minutes including physician time.  Signed, Nada Boozer NP 01/11/2017, 10:47 AM

## 2017-01-23 ENCOUNTER — Ambulatory Visit (INDEPENDENT_AMBULATORY_CARE_PROVIDER_SITE_OTHER): Payer: BLUE CROSS/BLUE SHIELD | Admitting: *Deleted

## 2017-01-23 DIAGNOSIS — I255 Ischemic cardiomyopathy: Secondary | ICD-10-CM

## 2017-01-23 NOTE — Progress Notes (Signed)
Wound check appointment. Staples removed. Wound without redness or edema. Incision edges approximated, wound well healed. Healing stages of bruising noted.  Normal device function. Threshold, sensing, and impedances consistent with implant measurements. Device programmed at 3.5V for extra safety margin until 3 month visit. Histogram distribution appropriate for patient and level of activity. No ventricular arrhythmias noted. Patient educated about wound care, arm mobility, lifting restrictions, shock plan. ROV in 3 months with implanting physician.

## 2017-03-25 DIAGNOSIS — Z720 Tobacco use: Secondary | ICD-10-CM | POA: Insufficient documentation

## 2017-03-25 NOTE — Progress Notes (Signed)
Cardiology Office Note    Date:  03/28/2017   ID:  Devin Ellis, DOB Feb 20, 1961, MRN [id number]  PCP:  Horald Pollen, PA-C  Cardiologist:  Nanetta Batty, MD; Thurmon Fair, MD   Chief Complaint  Patient presents with  . Follow-up    History of Present Illness:  Devin Ellis is a 56 y.o. male with ischemic CMP (anterior MI 2016, EF 30-35% - last echo 11/06/2016, CHF NYHA class II) and returns in follow-up roughly 2 months following implantation of a defibrillator for primary prevention.  Surgical site has healed well and he has no complaints related to it. Seems to have well compensated heart failure, without change in his chronic pattern of class II shortness of breath on exertion. He denies palpitations, syncope, defibrillator discharges or angina pectoris. He has noticed that his blood pressure is running low, but only has occasional mild complaints orthostatic dizziness.  [name] a long-standing smoker with 80-pack-year history but now only smokes a few cigarettes a day. Heart failure medicines are limited by relatively low blood pressure. He has a history of rheumatoid arthritis and had hemopericardium regarding surgical window 2 weeks after his infarction in 2016.  Past Medical History:  Diagnosis Date  . CAD in native artery, residual 60 LCX 06/14/2015  . Cardiomyopathy, ischemic, EF by Echo 40-45% 06/14/2015  . Hyperlipidemia LDL goal <70 06/14/2015  . Pericardial effusion 06/26/2015  . Pericardial tamponade 06/26/2015  . Rheumatoid arthritis involving both hands (HCC) 04/2016  . S/P angioplasty with stent, to LAD with Xience DES 06/12/15  06/14/2015  . ST elevation (STEMI) myocardial infarction involving left anterior descending coronary artery (HCC) 06/12/2015  . Tobacco abuse     Past Surgical History:  Procedure Laterality Date  . CARDIAC CATHETERIZATION N/A 06/12/2015   Procedure: Left Heart Cath and Coronary Angiography;  Surgeon: Runell Gess, MD;  Location: [facility]  INVASIVE CV LAB;  Service: Cardiovascular;  Laterality: N/A;  . CARDIAC CATHETERIZATION N/A 06/12/2015   Procedure: Coronary Stent Intervention;  Surgeon: Runell Gess, MD;  Location: [facility] INVASIVE CV LAB;  Service: Cardiovascular;  Laterality: N/A;  . ICD IMPLANT N/A 01/10/2017   Procedure: ICD Implant;  Surgeon: Thurmon Fair, MD;  Location: [facility] INVASIVE CV LAB;  Service: Cardiovascular;  Laterality: N/A;  . SHOULDER SURGERY Right    Torn bicep  . SUBXYPHOID PERICARDIAL WINDOW N/A 06/26/2015   Procedure: SUBXYPHOID PERICARDIAL WINDOW;  Surgeon: Purcell Nails, MD;  Location: [facility] OR;  Service: Thoracic;  Laterality: N/A;    Current Medications: Outpatient Medications Prior to Visit  Medication Sig Dispense Refill  . acetaminophen (TYLENOL) 325 MG tablet Take 2 tablets (650 mg total) by mouth every 4 (four) hours as needed for headache or mild pain.    [name] aspirin EC 81 MG tablet Take 81 mg by mouth daily.    [name] atorvastatin (LIPITOR) 10 MG tablet Take 1 tablet (10 mg total) by mouth daily. 90 tablet 3  . carvedilol (COREG) 12.5 MG tablet Take 1 tablet (12.5 mg total) by mouth 2 (two) times daily. 180 tablet 3  . clopidogrel (PLAVIX) 75 MG tablet Take 1 tablet (75 mg total) by mouth daily. 30 tablet 11  . folic acid (FOLVITE) 1 MG tablet Take 1 mg by mouth daily.  3  . HYDROcodone-acetaminophen (NORCO/VICODIN) 5-325 MG tablet Take 5-325 tablets by mouth 2 (two) times daily as needed.    [name] lisinopril (PRINIVIL,ZESTRIL) 2.5 MG tablet Take 1 tablet (2.5 mg total) by mouth daily.  90 tablet 3  . methotrexate (RHEUMATREX) 2.5 MG tablet 7.5mg  in the morning and 7.5mg s at night on saturdays    . nitroGLYCERIN (NITROSTAT) 0.4 MG SL tablet Place 1 tablet (0.4 mg total) under the tongue every 5 (five) minutes as needed for chest pain. 25 tablet 4  . golimumab (SIMPONI ARIA) 50 MG/4ML SOLN injection Inject 50 mg into the vein every 8 (eight) weeks.      No facility-administered medications prior to visit.       Allergies:   Patient has no known allergies.   Social History   Social History  . Marital status: Married    Spouse name: N/A  . Number of children: 5  . Years of education: N/A   Occupational History  . Not employed, was in the warehouse at [employer]    Social History Main Topics  . Smoking status: Current Every Day Smoker    Packs/day: 0.25    Years: 30.00    Types: Cigarettes  . Smokeless tobacco: Never Used  . Alcohol use None  . Drug use: Unknown  . Sexual activity: Not Asked   Other Topics Concern  . None   Social History Narrative   Married.  Five children total between him and his wife.  He works for [employer] in [employer].       Family History:  The patient's family history includes Diabetes in his father and mother; Hypertension in his mother.   ROS:   Please see the history of present illness.    ROS All other systems reviewed and are negative.   PHYSICAL EXAM:   VS:  BP 96/60 (BP Location: Right Arm, Patient Position: Sitting, Cuff Size: Normal)   Pulse 77   Ht 6\' 1"  (1.854 m)   Wt 165 lb 6.4 oz (75 kg)   BMI 21.82 kg/m     General: Alert, oriented x3, no distress Head: no evidence of trauma, PERRL, EOMI, no exophtalmos or lid lag, no myxedema, no xanthelasma; normal ears, nose and oropharynx Neck: normal jugular venous pulsations and no hepatojugular reflux; brisk carotid pulses without delay and no carotid bruits Chest: clear to auscultation, no signs of consolidation by percussion or palpation, normal fremitus, symmetrical and full respiratory excursions. Well-healed left subclavian surgical site. Cardiovascular: normal position and quality of the apical impulse, regular rhythm, normal first and second heart sounds, no murmurs, rubs or gallops Abdomen: no tenderness or distention, no masses by palpation, no abnormal pulsatility or arterial bruits, normal bowel sounds, no hepatosplenomegaly Extremities: no clubbing, cyanosis or edema; 2+  radial, ulnar and brachial pulses bilaterally; 2+ right femoral, posterior tibial and dorsalis pedis pulses; 2+ left femoral, posterior tibial and dorsalis pedis pulses; no subclavian or femoral bruits Neurological: grossly nonfocal   Wt Readings from Last 3 Encounters:  03/28/17 165 lb 6.4 oz (75 kg)  01/11/17 169 lb 11.2 oz (77 kg)  12/26/16 168 lb 12.8 oz (76.6 kg)      Studies/Labs Reviewed:   EKG:  EKG is  ordered today.  The ekg ordered today demonstrates Sinus rhythm withQ waves of old extensive anterolateral infarction, QTC 411 ms  Recent Labs: 01/03/2017: BUN 12; Creat 0.81; Hemoglobin 16.5; Platelets 259; Potassium 4.8; Sodium 137; TSH 2.36   Lipid Panel    Component Value Date/Time   CHOL 125 06/14/2015 0406   TRIG 119 06/14/2015 0406   HDL 42 06/14/2015 0406   CHOLHDL 3.0 06/14/2015 0406   VLDL 24 06/14/2015 0406  LDLCALC 59 06/14/2015 0406     ASSESSMENT:    1. Chronic combined systolic and diastolic CHF (congestive heart failure) (HCC)   2. ICD (implantable cardioverter-defibrillator) in place, placed 01/10/17 MTD   3. Coronary artery disease involving native coronary artery of native heart without angina pectoris   4. Tobacco abuse   5. Hyperlipidemia LDL goal <70   6. Cardiomyopathy, ischemic      PLAN:  In order of problems listed above:  1. CHF: well compensated, euvolemic. Meds limited by BP. I do not think we can titrate them any further. 2. ICD: Well-healed site. Normal device function. Re-visited instructions regarding his "shock plan", remote downloads every 3 months, yearly office visits 3. CAD: Asymptomatic at this time 4. Smoking: Even though he is to be congratulated him how much he has cut back smoking, he should quit completely as soon as possible. His wife is working hard recommends him to quit. 5. HLP: LDL under 70 and all lipid parameters in target range    Medication Adjustments/Labs and Tests Ordered: Current medicines are reviewed  at length with the patient today.  Concerns regarding medicines are outlined above.  Medication changes, Labs and Tests ordered today are listed in the Patient Instructions below. Patient Instructions  Dr Royann Shivers recommends that you continue on your current medications as directed. Please refer to the Current Medication list given to you today.  Remote monitoring is used to monitor your Pacemaker or ICD from home. This monitoring reduces the number of office visits required to check your device to one time per year. It allows [place] to keep an eye on the functioning of your device to ensure it is working properly. You are scheduled for a device check from home on Wednesday, August 29th, 2018. You may send your transmission at any time that day. If you have a wireless device, the transmission will be sent automatically. After your physician reviews your transmission, you will receive a notification with your next transmission date.  Dr Royann Shivers recommends that you schedule a follow-up appointment in 12 months with a defibrillator check. You will receive a reminder letter in the mail two months in advance. If you don't receive a letter, please call our office to schedule the follow-up appointment.  If you need a refill on your cardiac medications before your next appointment, please call your pharmacy.    Signed, Thurmon Fair, MD  03/28/2017 9:02 AM    [facility] [address] [place], [place], [place]  [address] Phone: [phone]; Fax: [phone]

## 2017-03-28 ENCOUNTER — Encounter: Payer: Self-pay | Admitting: Cardiovascular Disease

## 2017-03-28 ENCOUNTER — Ambulatory Visit (INDEPENDENT_AMBULATORY_CARE_PROVIDER_SITE_OTHER): Payer: BLUE CROSS/BLUE SHIELD | Admitting: Cardiovascular Disease

## 2017-03-28 VITALS — BP 96/60 | HR 77 | Ht 73.0 in | Wt 165.4 lb

## 2017-03-28 DIAGNOSIS — I2510 Atherosclerotic heart disease of native coronary artery without angina pectoris: Secondary | ICD-10-CM

## 2017-03-28 DIAGNOSIS — E785 Hyperlipidemia, unspecified: Secondary | ICD-10-CM

## 2017-03-28 DIAGNOSIS — I5042 Chronic combined systolic (congestive) and diastolic (congestive) heart failure: Secondary | ICD-10-CM

## 2017-03-28 DIAGNOSIS — Z95810 Presence of automatic (implantable) cardiac defibrillator: Secondary | ICD-10-CM

## 2017-03-28 DIAGNOSIS — I255 Ischemic cardiomyopathy: Secondary | ICD-10-CM

## 2017-03-28 DIAGNOSIS — Z720 Tobacco use: Secondary | ICD-10-CM

## 2017-03-28 LAB — CUP PACEART INCLINIC DEVICE CHECK
Battery Remaining Longevity: 135 mo
Brady Statistic RV Percent Paced: 0.1 % — CL
HIGH POWER IMPEDANCE MEASURED VALUE: 64 Ohm
Implantable Lead Implant Date: 20180314
Implantable Lead Location: 753860
Lead Channel Pacing Threshold Amplitude: 0.75 V
Lead Channel Setting Pacing Amplitude: 2.5 V
Lead Channel Setting Pacing Pulse Width: 0.4 ms
MDC IDC MSMT LEADCHNL RV IMPEDANCE VALUE: 570 Ohm
MDC IDC MSMT LEADCHNL RV PACING THRESHOLD PULSEWIDTH: 0.4 ms
MDC IDC MSMT LEADCHNL RV SENSING INTR AMPL: 16.5 mV
MDC IDC PG IMPLANT DT: 20180314
MDC IDC SESS DTM: 20180530164149
MDC IDC SET LEADCHNL RV SENSING SENSITIVITY: 0.3 mV

## 2017-03-28 NOTE — Patient Instructions (Signed)
Dr Royann Shivers recommends that you continue on your current medications as directed. Please refer to the Current Medication list given to you today.  Remote monitoring is used to monitor your Pacemaker or ICD from home. This monitoring reduces the number of office visits required to check your device to one time per year. It allows [place] to keep an eye on the functioning of your device to ensure it is working properly. You are scheduled for a device check from home on Wednesday, August 29th, 2018. You may send your transmission at any time that day. If you have a wireless device, the transmission will be sent automatically. After your physician reviews your transmission, you will receive a notification with your next transmission date.  Dr Royann Shivers recommends that you schedule a follow-up appointment in 12 months with a defibrillator check. You will receive a reminder letter in the mail two months in advance. If you don't receive a letter, please call our office to schedule the follow-up appointment.  If you need a refill on your cardiac medications before your next appointment, please call your pharmacy.

## 2017-05-04 ENCOUNTER — Telehealth: Payer: Self-pay | Admitting: Cardiovascular Disease

## 2017-05-04 NOTE — Telephone Encounter (Signed)
New message      [facility] Pre-operative Risk Assessment    Request for surgical clearance:  1. What type of surgery is being performed? colonoscopy  2. When is this surgery scheduled? Not scheduled till august 3rd  3. Are there any medications that need to be held prior to surgery and how long? Plavix for 4 days prior to surgery  4. Name of physician performing surgery? Dr. Concepcion Elk  5. What is your office phone and fax number?PH [phone] FAX [phone]  Efrain Sella 05/04/2017, 2:59 PM  _________________________________________________________________   (provider comments below)

## 2017-05-07 IMAGING — CT CT ANGIO CHEST
2 of 9 series · 18 of 46 positions shown · IV contrast (OMNI)
Comparison: Prior chest radiograph

CLINICAL DATA: Positive D-dimer.

Pt reports increased leg swelling and SOB. Pt reports this has been
increasing even with Lasix use. Pt denies chest pain.
EXAM:
CT ANGIOGRAPHY CHEST WITH CONTRAST
TECHNIQUE: Multidetector CT imaging of the chest was performed using the
standard protocol during bolus administration of intravenous
contrast. Multiplanar CT image reconstructions and MIPs were
obtained to evaluate the vascular anatomy.
CONTRAST:  100mL OMNIPAQUE IOHEXOL 350 MG/ML SOLN

[Series 5: thins · axial · 0.69mm/px · z∈[+1284,+1532]mm · 15 of 280 slices shown]
[im 16/280  lung]
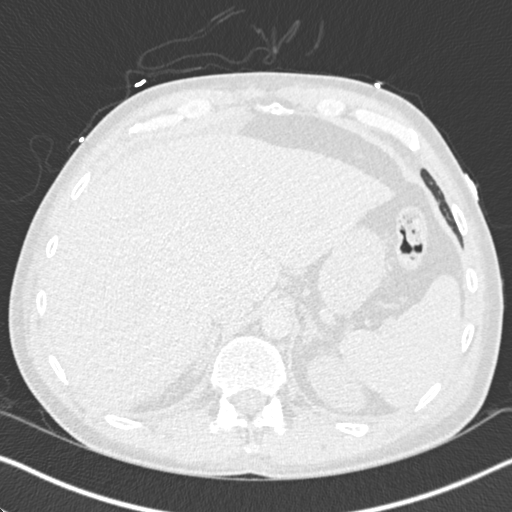
[im 32/280  soft-tissue]
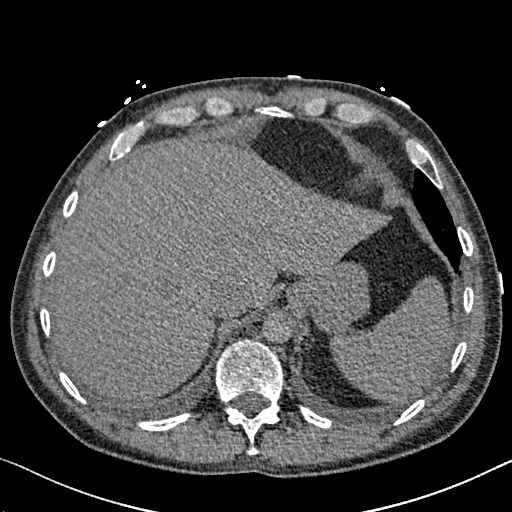
[im 47/280  lung]
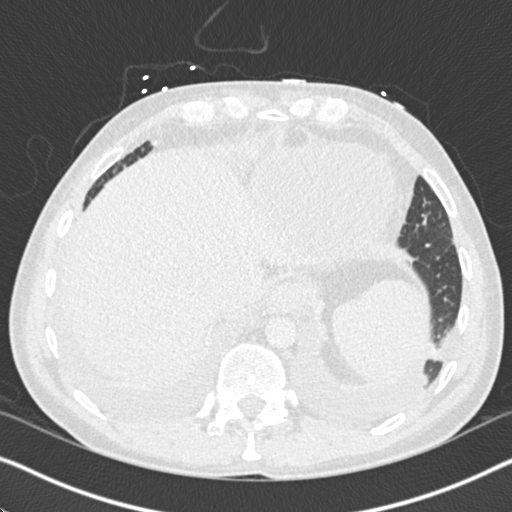
[im 63/280  soft-tissue]
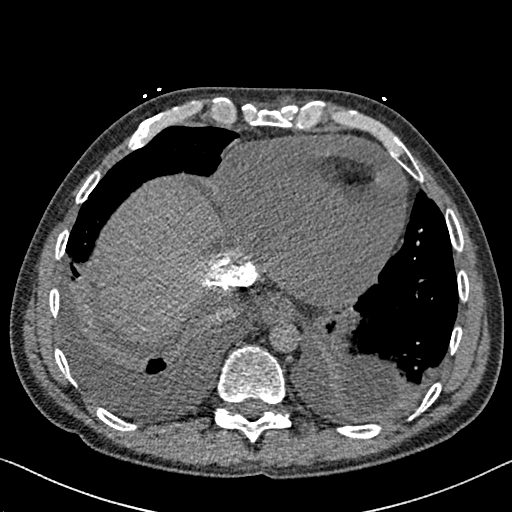
[im 94/280  lung]
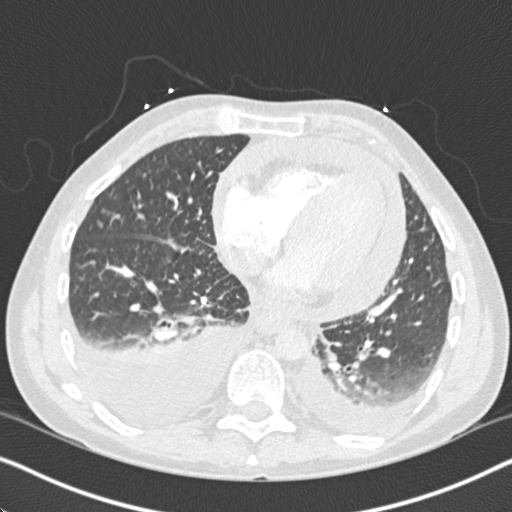
[im 109/280  soft-tissue]
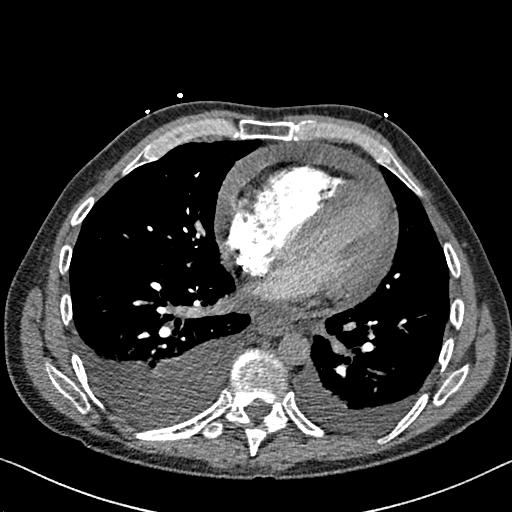
[im 125/280  lung]
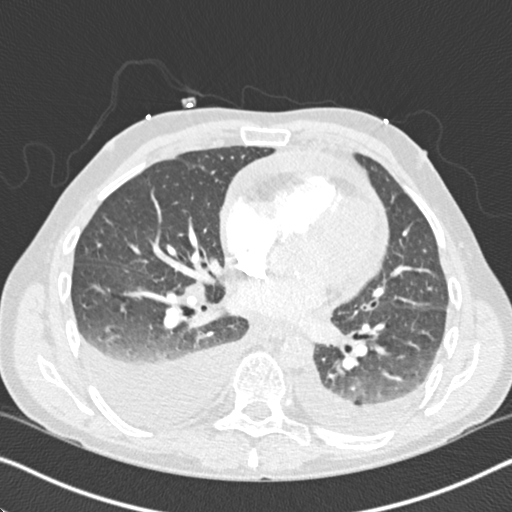
[im 140/280  soft-tissue]
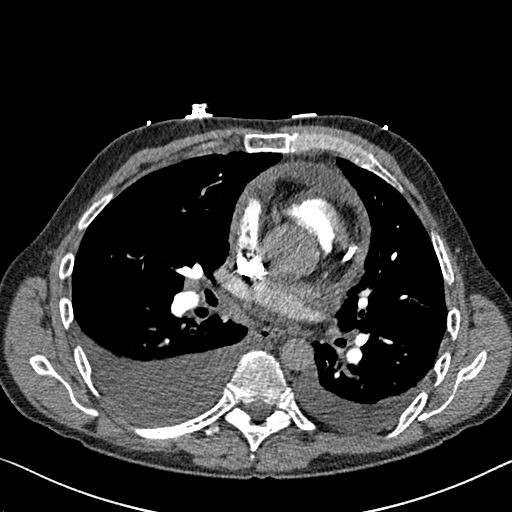
[im 156/280  lung]
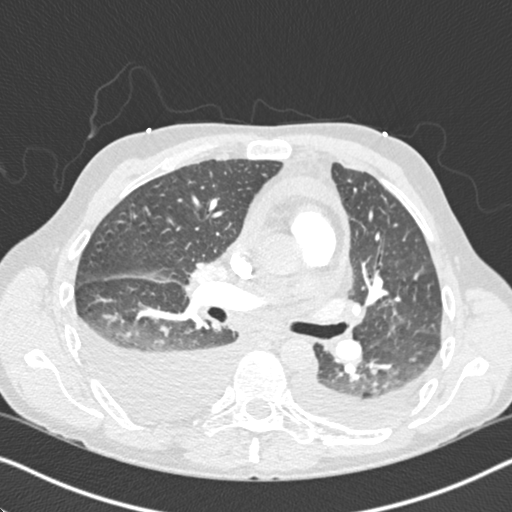
[im 171/280  soft-tissue]
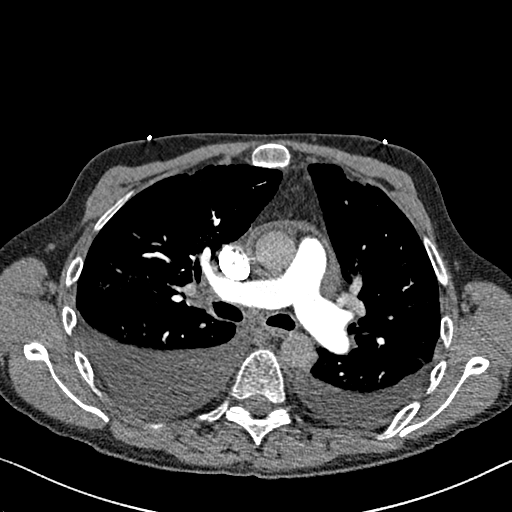
[im 187/280  lung]
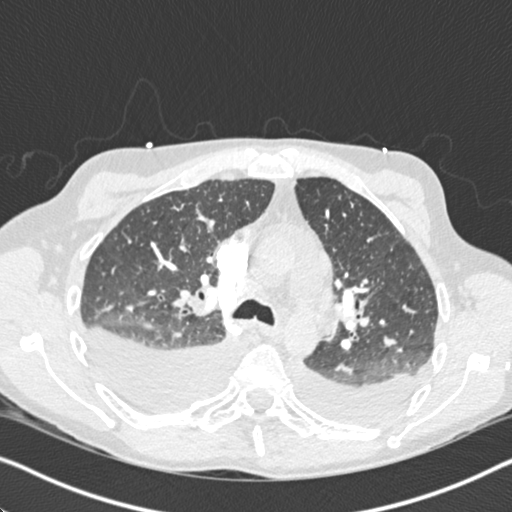
[im 218/280  soft-tissue]
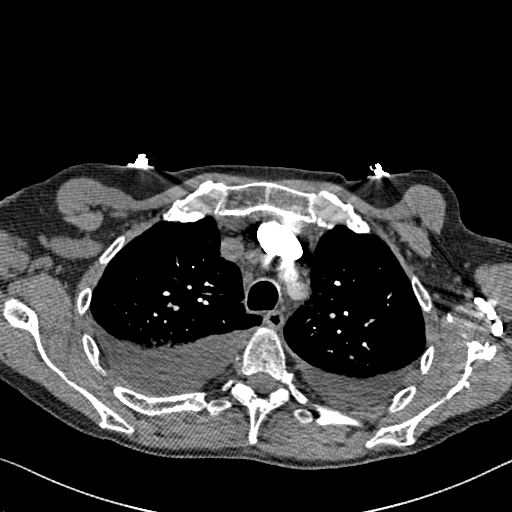
[im 233/280  lung]
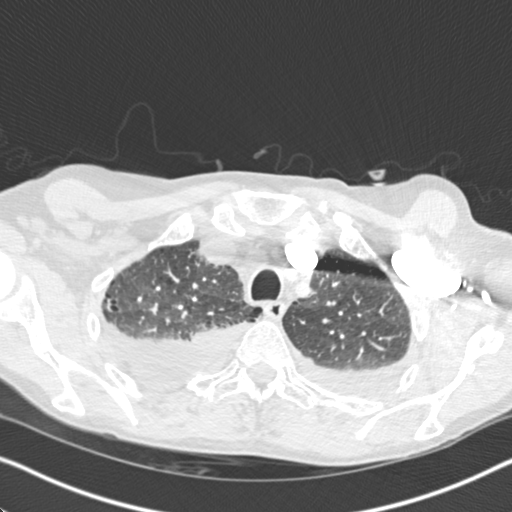
[im 249/280  soft-tissue]
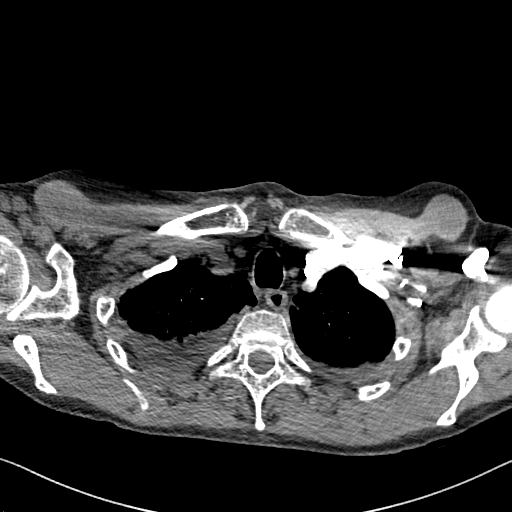
[im 264/280  lung]
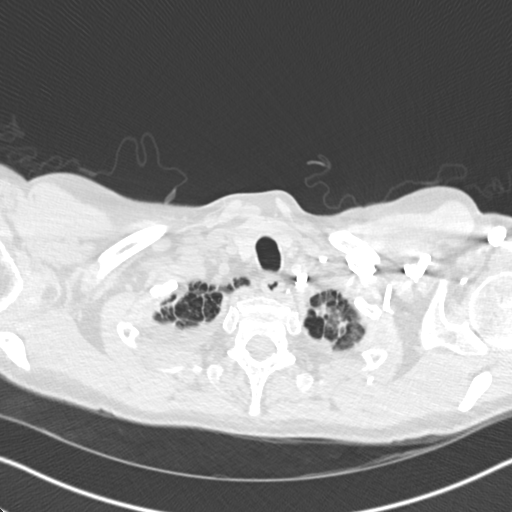

[Series 7: coronal mpr · coronal · 0.59mm/px · 3 of 127 slices shown]
[im 32/127  soft-tissue]
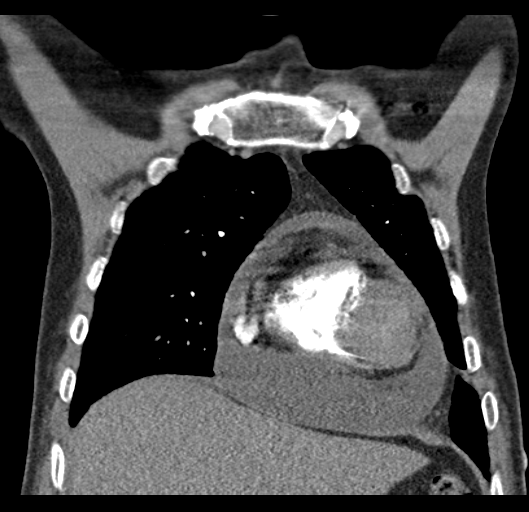
[im 64/127  soft-tissue]
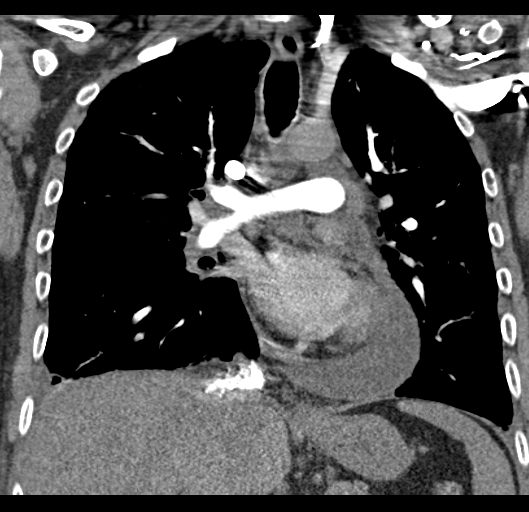
[im 95/127  soft-tissue]
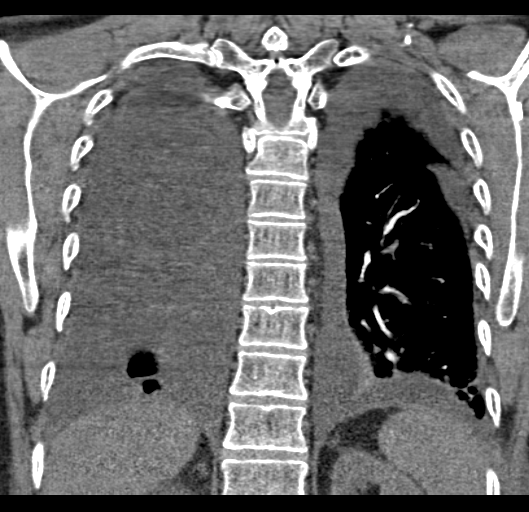

[18 of 46 positions shown; findings below may reference images not displayed]

FINDINGS: Angiographic study: No evidence of a pulmonary embolus. Great
vessels are normal in caliber.

Thoracic inlet: No mass or adenopathy. Visualized thyroid is
unremarkable.

Mediastinum and hila: Heart normal in size. There is a
moderate-sized pericardial effusion there are moderate coronary
artery calcifications. Several mildly prominent mediastinal lymph
nodes are noted, the largest a right peritracheal, azygos level node
measuring 12 mm in short axis, and a right subcarinal lymph node
measuring 12 mm in short axis. There is also increased soft tissue
along the hilar without a discrete enlarged lymph node.

Lungs and pleural: Moderate right and small left pleural effusions.
There is dependent lower lobe atelectasis, right greater than left.
Mild interstitial thickening is noted and there are changes of mild
centrilobular and paraseptal emphysema in the upper lobes. No focal
consolidation is seen to suggest pneumonia. No pneumothorax.

Limited upper abdomen: Trace amount of ascites adjacent to the
liver.

Musculoskeletal:  No osteoblastic or osteolytic lesions.

Review of the MIP images confirms the above findings.
IMPRESSION: 1. No evidence of a pulmonary embolism.
2. Moderate pericardial effusion. There are also moderate right and
small left pleural effusions and a trace amount of ascites.
3. Mild interstitial thickening. This may be chronic. Mild
interstitial edema is possible. There is no evidence of airspace
edema or pneumonia.
4. Lower lobe atelectasis adjacent to the pleural effusions, right
greater than left.
5. Moderate coronary artery calcifications.
6. Mild emphysema.

## 2017-05-07 IMAGING — CR DG CHEST 2V
2 series · 2 of 2 positions shown · non-contrast
Comparison: None.

CLINICAL DATA: Acute heart failure

EXAM:
CHEST  2 VIEW

[w chest pa]
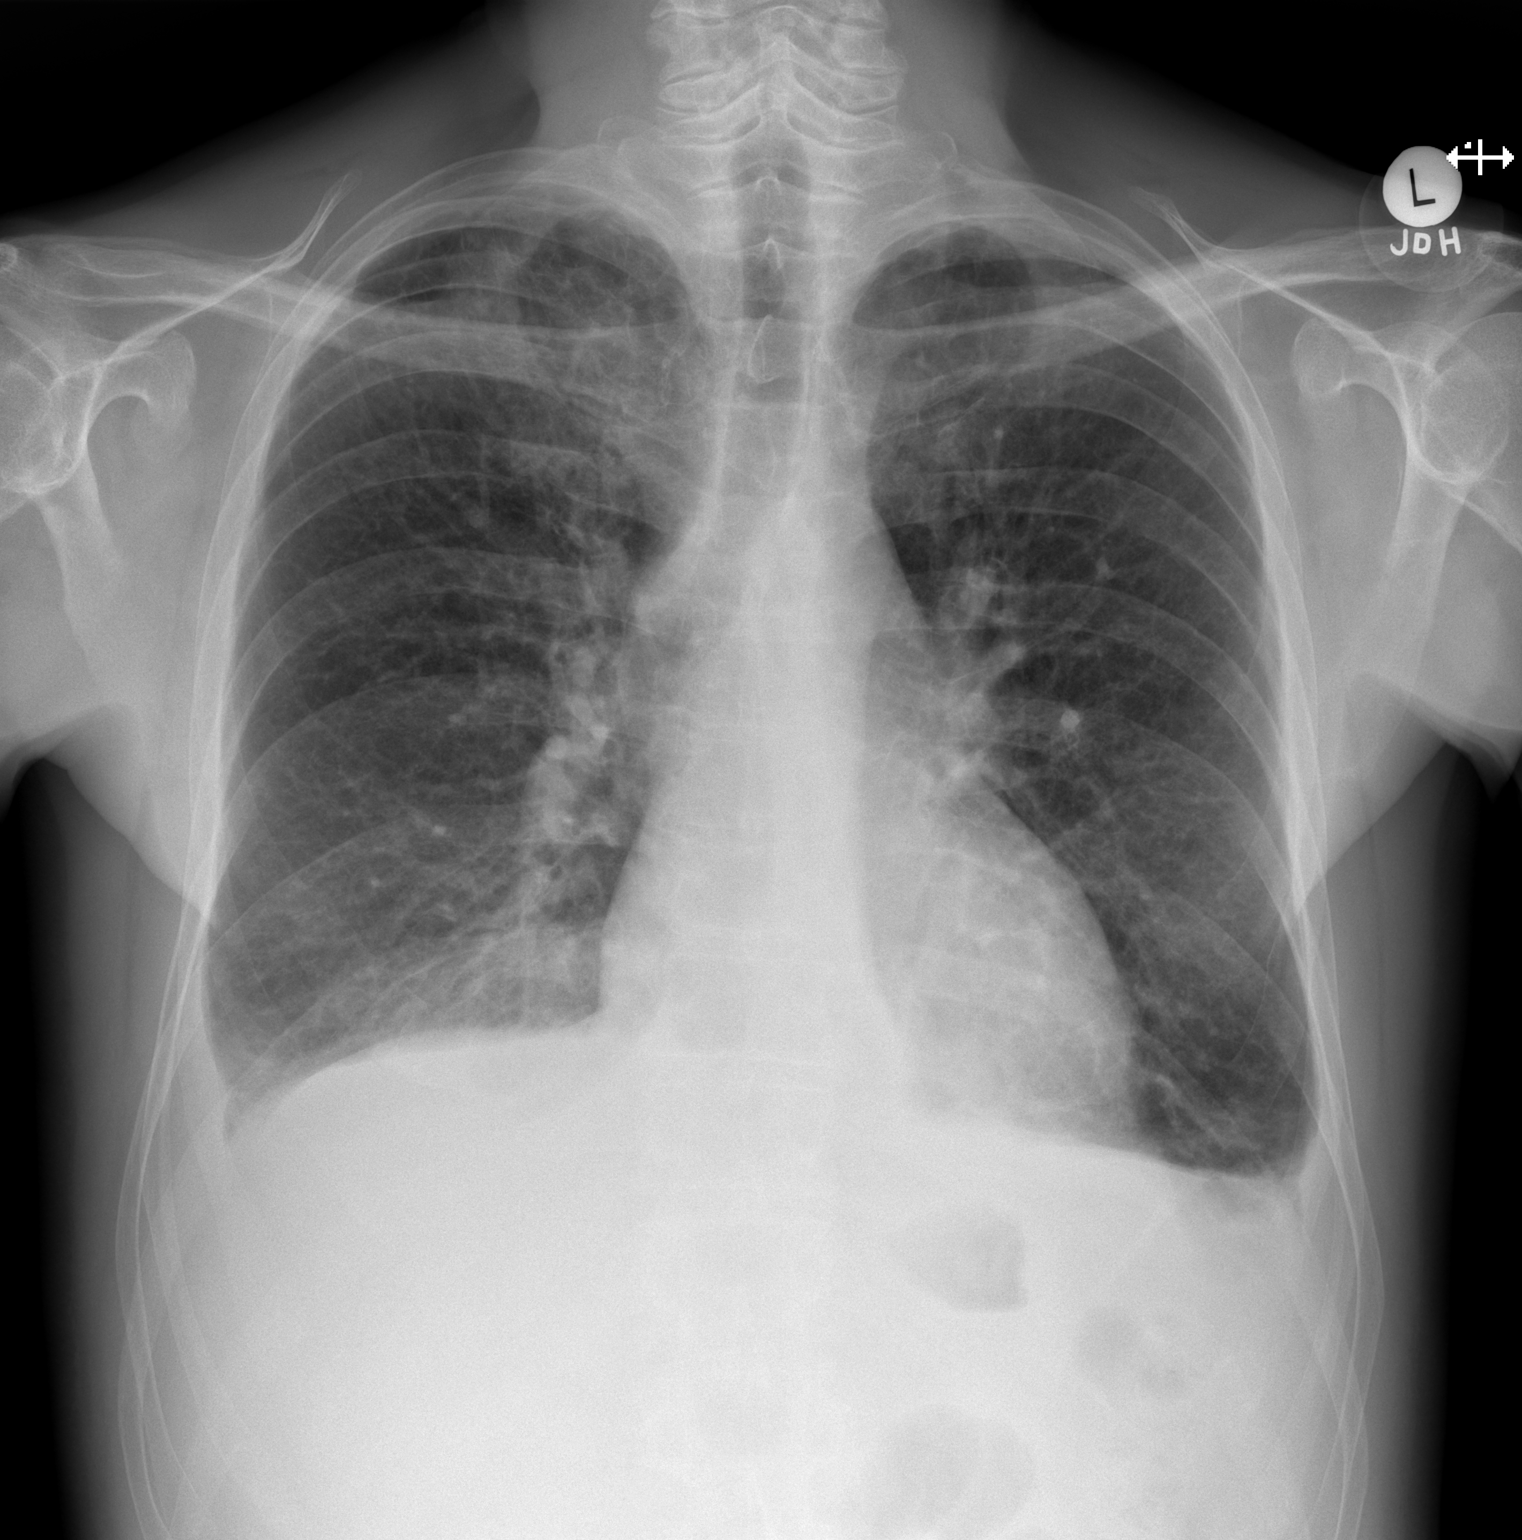

[w chest lat]
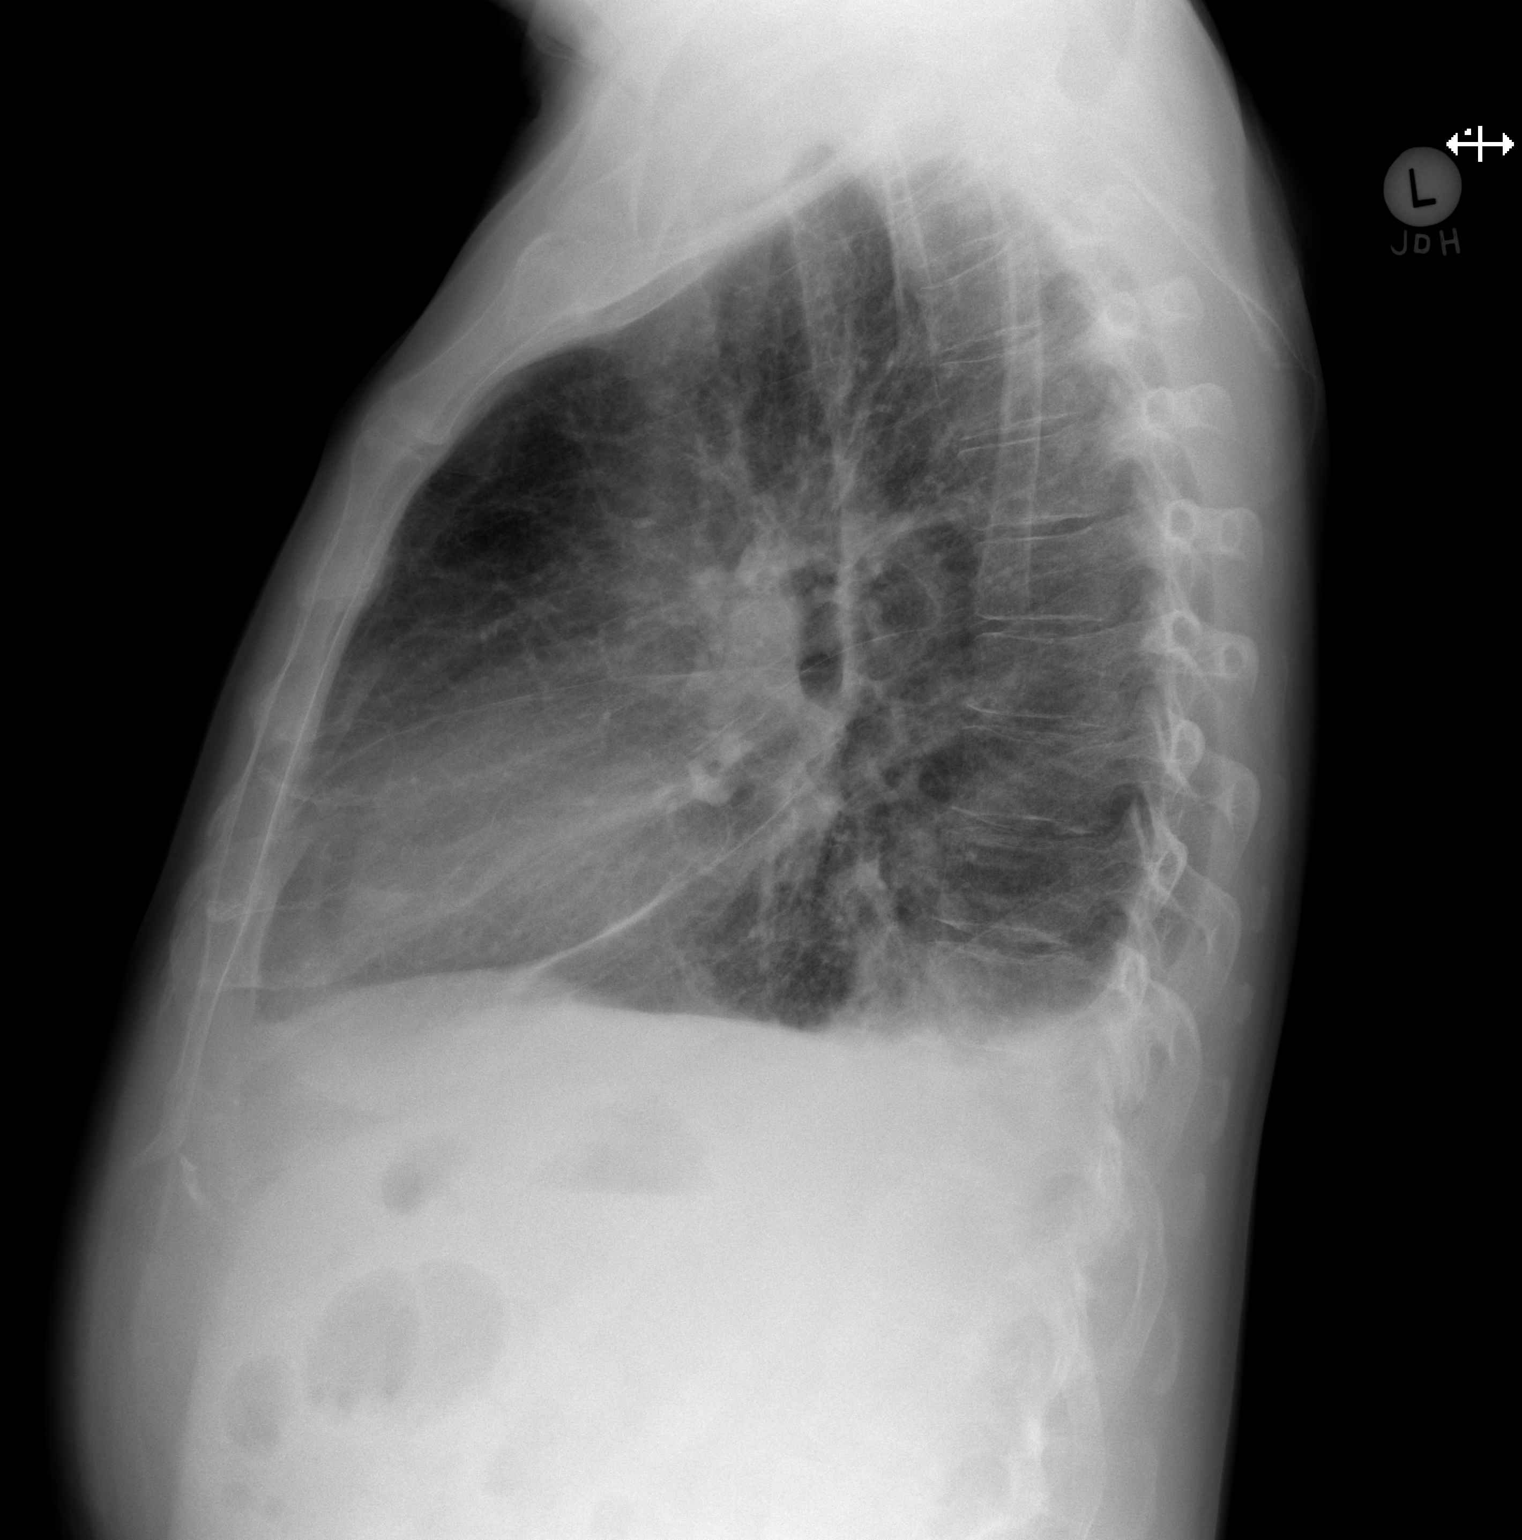

[2 of 2 positions shown; findings below may reference images not displayed]

FINDINGS: Cardiomediastinal silhouette is unremarkable. Bilateral small
pleural effusion. There is right base medially infiltrate highly
suspicious for pneumonia. Streaky atelectasis or infiltrate right
upper lobe paratracheal. No convincing pulmonary edema.
IMPRESSION: Bilateral small pleural effusion. There is right base medially
infiltrate highly suspicious for pneumonia. Streaky atelectasis or
infiltrate right upper lobe paratracheal. No convincing pulmonary
edema.

## 2017-05-07 IMAGING — DX DG CHEST 2V
2 series · 2 of 2 positions shown · non-contrast
Comparison: 06/25/2015

CLINICAL DATA: Chest pain and shortness of breath with increased
leg swelling

EXAM:
CHEST - 2 VIEW

[w chest pa]
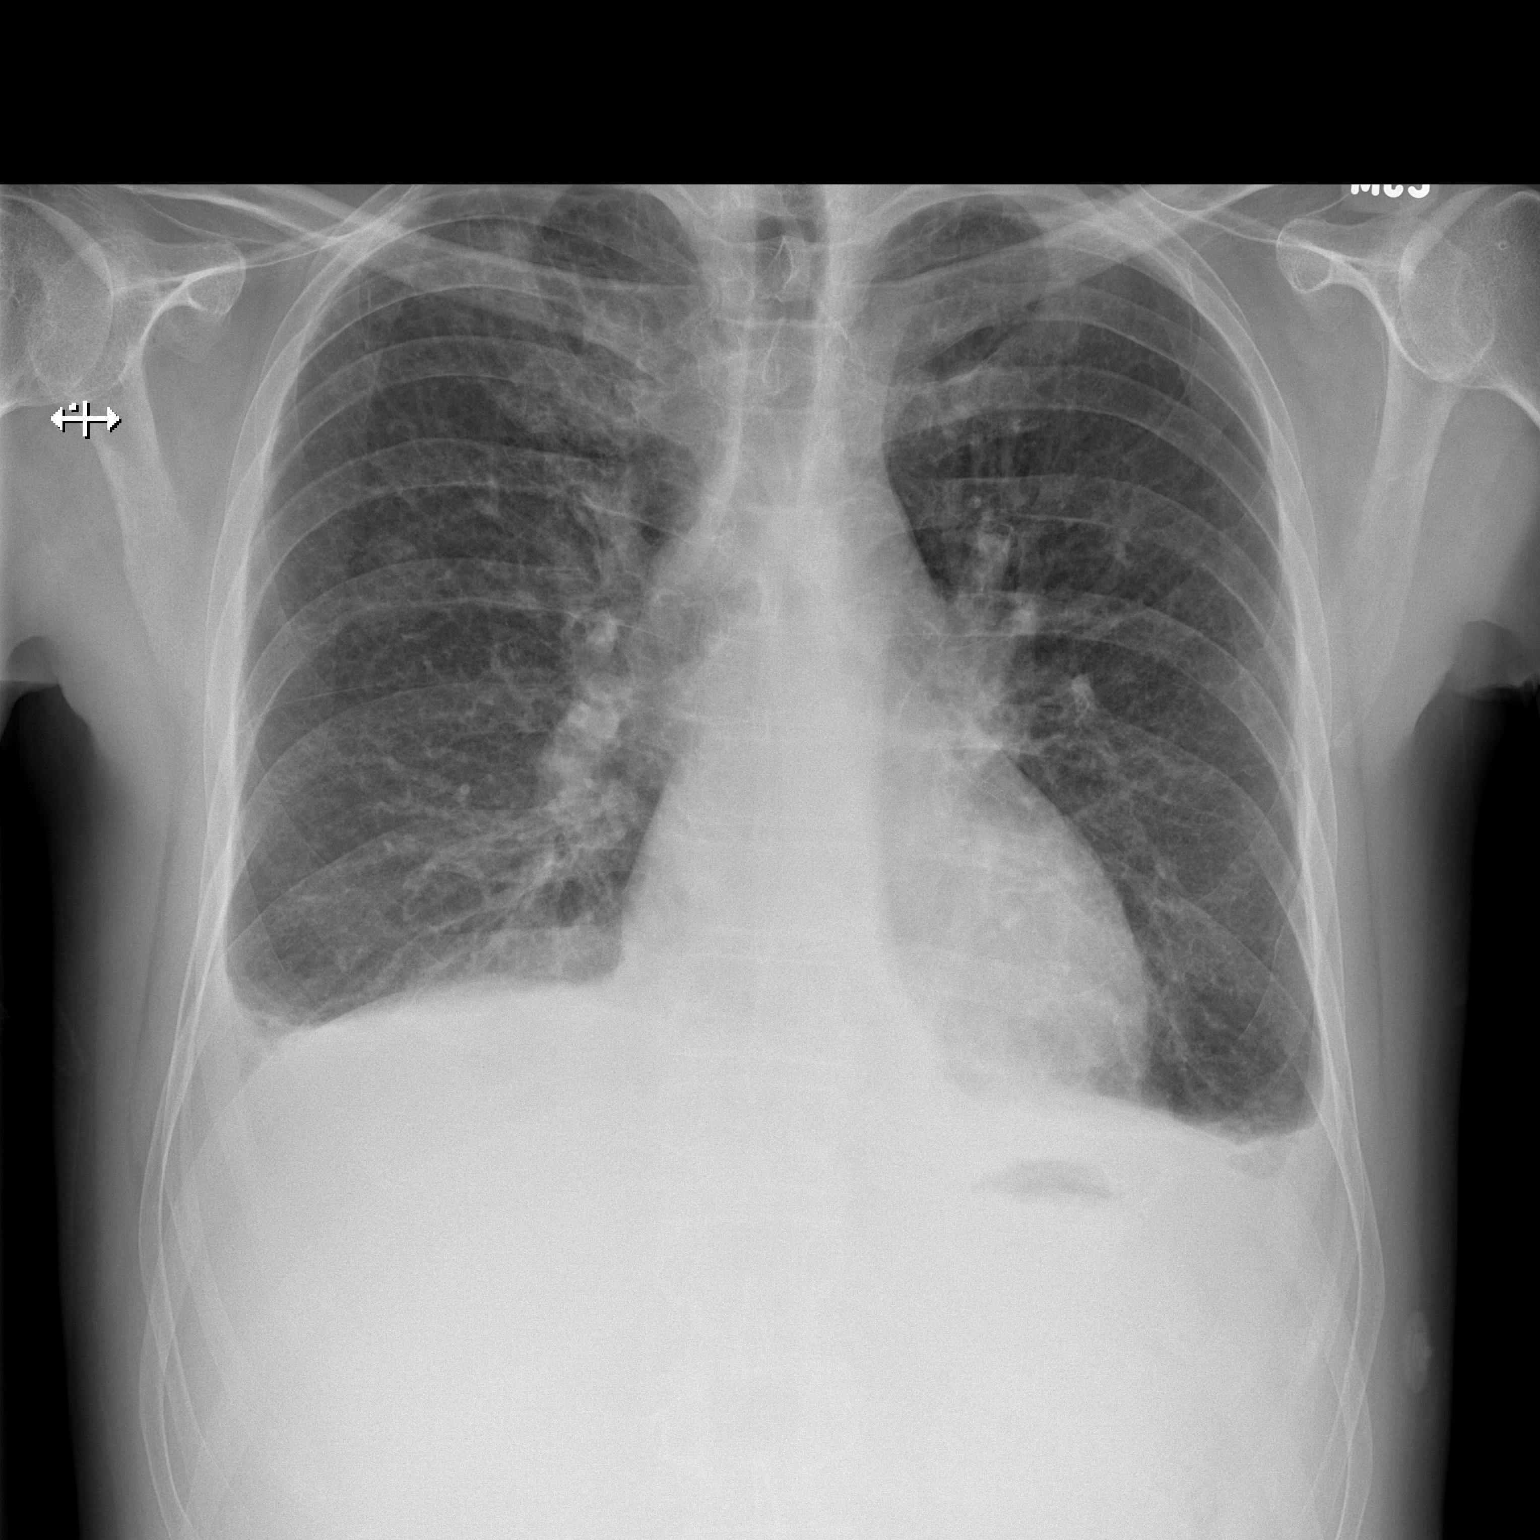

[w chest lat]
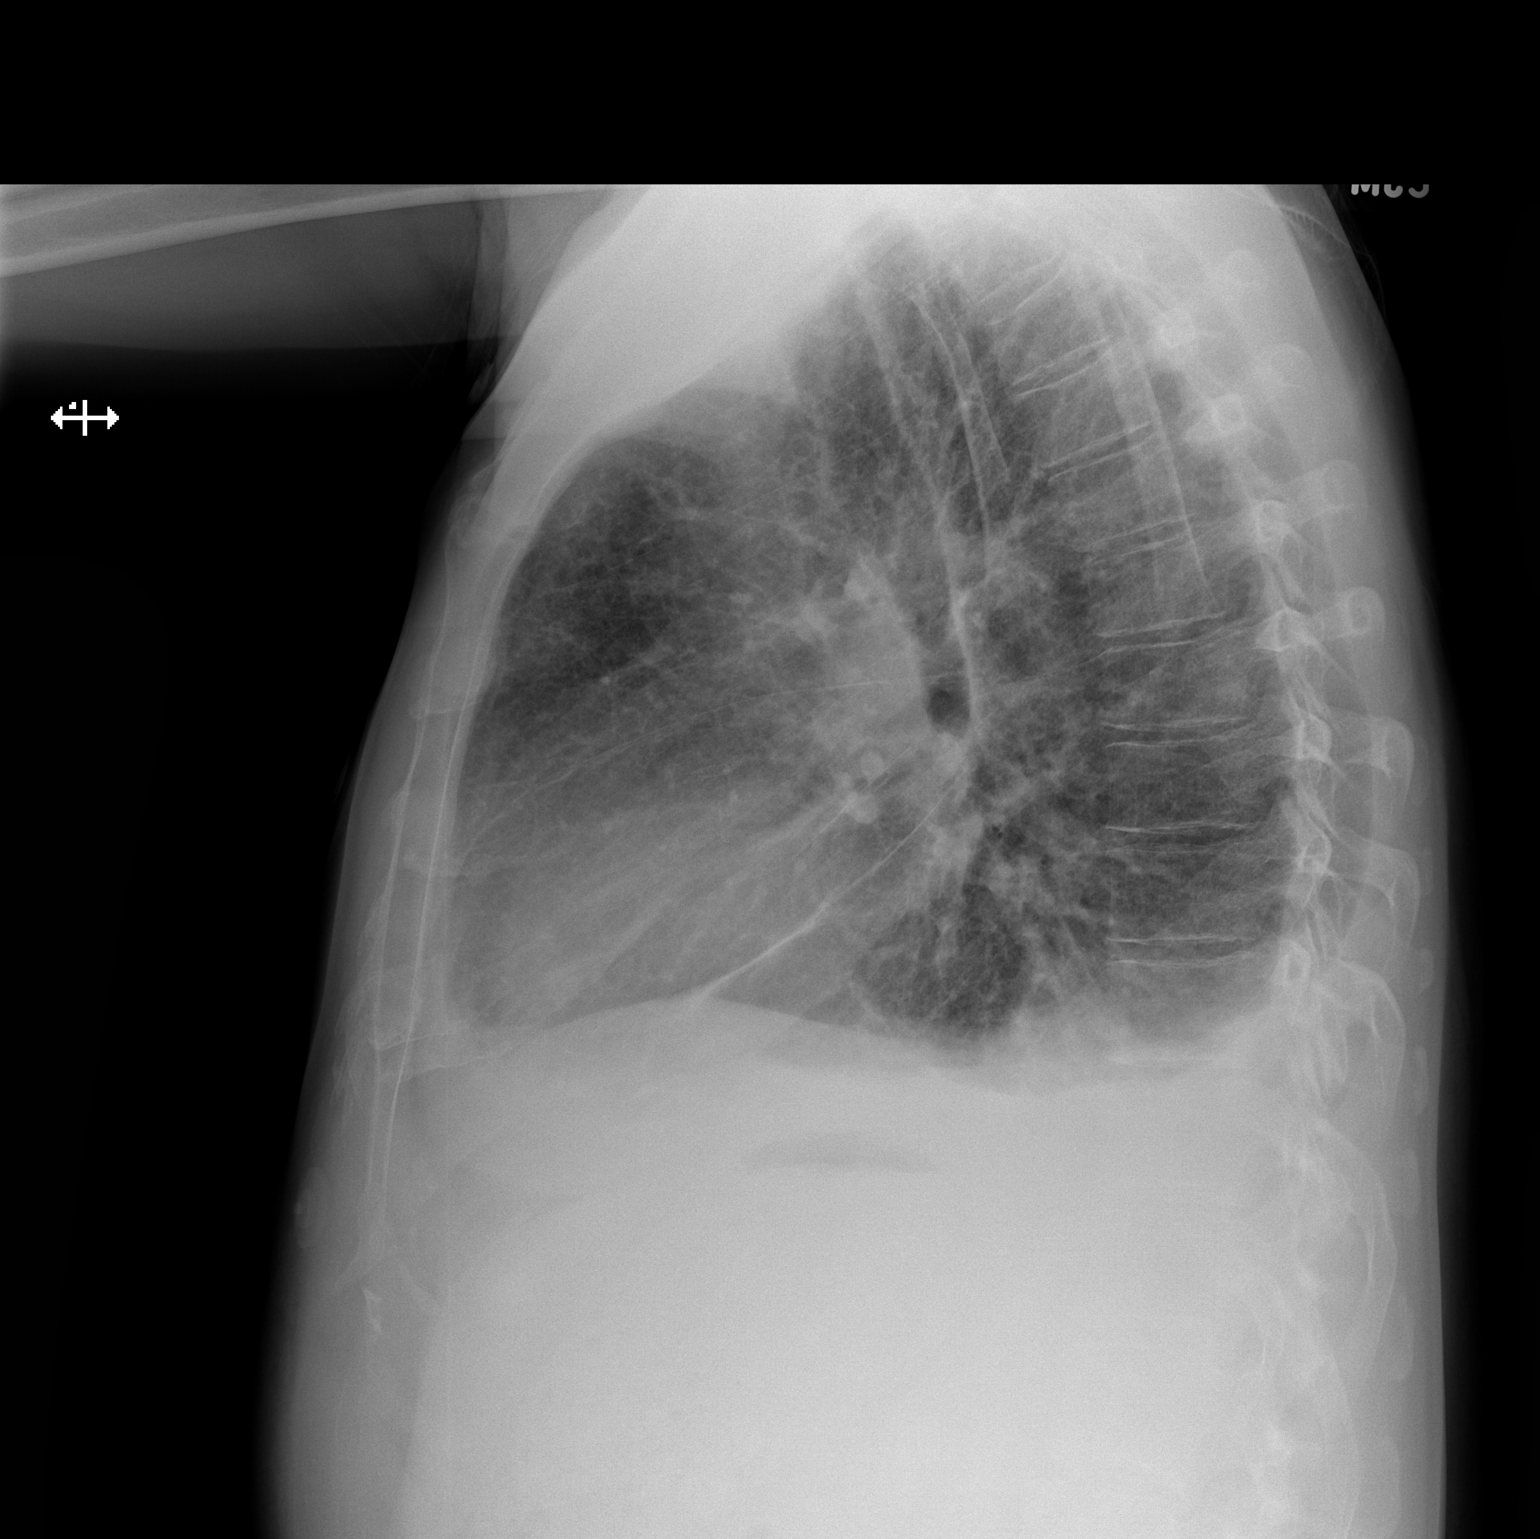

[2 of 2 positions shown; findings below may reference images not displayed]

FINDINGS: Cardiac shadow is stable from the film earlier in the day. Small
bilateral pleural effusions are again noted. No significant vascular
congestion is noted. Persistent density is noted in the medial
aspect of the right lung apex. This may be related to a confluence
of vascular parenchymal shadows although the possibility focal
infiltrate cannot be excluded.
IMPRESSION: Stable appearance of the chest when compared with the prior exam.

## 2017-05-08 ENCOUNTER — Telehealth: Payer: Self-pay | Admitting: Cardiovascular Disease

## 2017-05-08 NOTE — Telephone Encounter (Signed)
Requesting surgical clearance:  1. Type of surgery: Colonoscopy  2. Surgeon: not specified  3.Surgical Date:  06-01-17  4. Medications that need to be held: ASA and Plavix--hold 7 days prior to procedure   5. CAD: Yes  6. I will defer to:  Dr. Peter Minium Cardiologist   Contact Information:  [facility] Phone: [phone] Fax:  [phone]

## 2017-05-09 ENCOUNTER — Telehealth: Payer: Self-pay | Admitting: Cardiovascular Disease

## 2017-05-09 ENCOUNTER — Encounter: Payer: Self-pay | Admitting: Cardiovascular Disease

## 2017-05-09 IMAGING — CR DG CHEST 1V PORT
1 series · 1 of 1 positions shown · non-contrast
Comparison: 1 day prior

CLINICAL DATA: Evaluate chest tube.  Tobacco abuse.

EXAM:
PORTABLE CHEST - 1 VIEW

[AP]
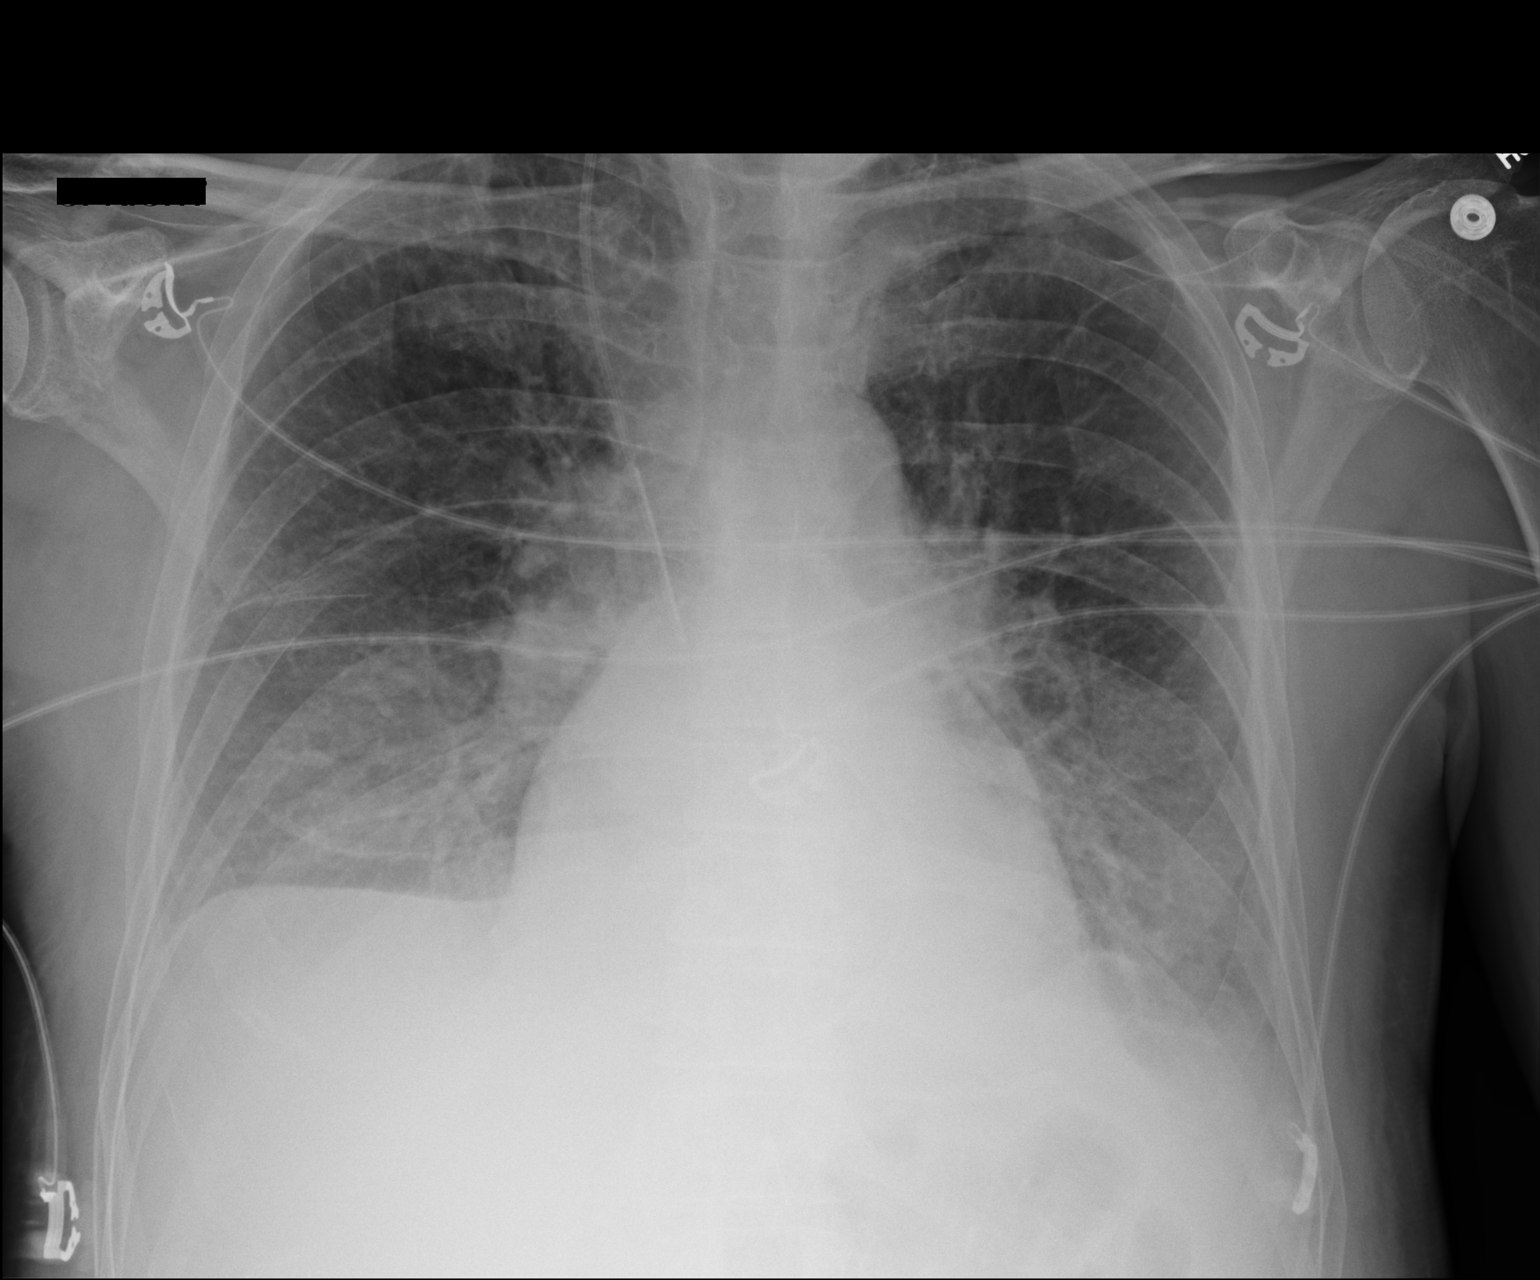

[1 of 1 positions shown; findings below may reference images not displayed]

FINDINGS: Right internal jugular line tip at low SVC. Midline trachea.
Cardiomegaly accentuated by AP portable technique. Suspect layering
bilateral pleural effusions. No pneumothorax. Improvement in mild
interstitial edema. Persistent bibasilar airspace disease.
IMPRESSION: Improved congestive heart failure.

Similar bibasilar atelectasis with probable layering bilateral
pleural effusions.

## 2017-05-09 NOTE — Telephone Encounter (Signed)
This has already been sent to Dr. Allyson Sabal.

## 2017-05-09 NOTE — Telephone Encounter (Signed)
New message      [facility] Pre-operative Risk Assessment    Request for surgical clearance:  1. What type of surgery is being performed? Colonoscopy  2. When is this surgery scheduled? August 3rd  3. Are there any medications that need to be held prior to surgery and how long? Plavix 4 days prior  4. Name of physician performing surgery? Dr. Cristal Deer Jew  5. What is your office phone and fax number? Fax [phone] ph [phone]  Efrain Sella 05/09/2017, 2:04 PM  _________________________________________________________________   (provider comments below)

## 2017-05-09 NOTE — Telephone Encounter (Signed)
epicd 

## 2017-05-09 NOTE — Telephone Encounter (Signed)
Routed to primary MD to review.

## 2017-05-13 NOTE — Telephone Encounter (Signed)
OK to interrupt anti platelet Rx for Colonoscopy 

## 2017-05-13 NOTE — Telephone Encounter (Signed)
OK to interrupt anti platelet RX for colonoscopy

## 2017-05-15 NOTE — Telephone Encounter (Signed)
See duplicate telephone note

## 2017-05-15 NOTE — Telephone Encounter (Signed)
Faxed to # provided via Epic. 

## 2017-06-07 ENCOUNTER — Other Ambulatory Visit: Payer: Self-pay | Admitting: Cardiovascular Disease

## 2017-06-27 ENCOUNTER — Ambulatory Visit (INDEPENDENT_AMBULATORY_CARE_PROVIDER_SITE_OTHER): Payer: BLUE CROSS/BLUE SHIELD | Admitting: *Deleted

## 2017-06-27 DIAGNOSIS — I255 Ischemic cardiomyopathy: Secondary | ICD-10-CM

## 2017-06-27 LAB — CUP PACEART REMOTE DEVICE CHECK
Battery Voltage: 3.08 V
Date Time Interrogation Session: 20180829052405
HighPow Impedance: 77 Ohm
Implantable Lead Location: 753860
Implantable Pulse Generator Implant Date: 20180314
Lead Channel Pacing Threshold Pulse Width: 0.4 ms
Lead Channel Sensing Intrinsic Amplitude: 17.25 mV
Lead Channel Setting Pacing Amplitude: 2.5 V
MDC IDC LEAD IMPLANT DT: 20180314
MDC IDC MSMT BATTERY REMAINING LONGEVITY: 135 mo
MDC IDC MSMT LEADCHNL RV IMPEDANCE VALUE: 532 Ohm
MDC IDC MSMT LEADCHNL RV IMPEDANCE VALUE: 589 Ohm
MDC IDC MSMT LEADCHNL RV PACING THRESHOLD AMPLITUDE: 0.5 V
MDC IDC MSMT LEADCHNL RV SENSING INTR AMPL: 17.25 mV
MDC IDC SET LEADCHNL RV PACING PULSEWIDTH: 0.4 ms
MDC IDC SET LEADCHNL RV SENSING SENSITIVITY: 0.3 mV
MDC IDC STAT BRADY RV PERCENT PACED: 0 %

## 2017-06-27 NOTE — Progress Notes (Signed)
Remote ICD transmission.   

## 2017-07-10 ENCOUNTER — Encounter: Payer: Self-pay | Admitting: Cardiology

## 2017-09-26 ENCOUNTER — Ambulatory Visit (INDEPENDENT_AMBULATORY_CARE_PROVIDER_SITE_OTHER): Payer: BLUE CROSS/BLUE SHIELD | Admitting: *Deleted

## 2017-09-26 DIAGNOSIS — I255 Ischemic cardiomyopathy: Secondary | ICD-10-CM | POA: Diagnosis not present

## 2017-09-26 NOTE — Progress Notes (Signed)
Remote ICD transmission.   

## 2017-09-28 ENCOUNTER — Encounter: Payer: Self-pay | Admitting: Cardiology

## 2017-10-01 LAB — CUP PACEART REMOTE DEVICE CHECK
Battery Remaining Longevity: 134 mo
Battery Voltage: 3.05 V
HIGH POWER IMPEDANCE MEASURED VALUE: 81 Ohm
Implantable Lead Implant Date: 20180314
Implantable Pulse Generator Implant Date: 20180314
Lead Channel Impedance Value: 513 Ohm
Lead Channel Pacing Threshold Amplitude: 0.5 V
Lead Channel Pacing Threshold Pulse Width: 0.4 ms
Lead Channel Sensing Intrinsic Amplitude: 18.5 mV
Lead Channel Setting Pacing Amplitude: 2.5 V
Lead Channel Setting Pacing Pulse Width: 0.4 ms
Lead Channel Setting Sensing Sensitivity: 0.3 mV
MDC IDC LEAD LOCATION: 753860
MDC IDC MSMT LEADCHNL RV IMPEDANCE VALUE: 589 Ohm
MDC IDC MSMT LEADCHNL RV SENSING INTR AMPL: 18.5 mV
MDC IDC SESS DTM: 20181128072823
MDC IDC STAT BRADY RV PERCENT PACED: 0.01 %

## 2017-11-26 ENCOUNTER — Other Ambulatory Visit: Payer: Self-pay | Admitting: Cardiovascular Disease

## 2017-11-26 NOTE — Telephone Encounter (Signed)
Rx(s) sent to pharmacy electronically.  

## 2017-11-28 ENCOUNTER — Ambulatory Visit: Payer: BLUE CROSS/BLUE SHIELD | Admitting: Cardiovascular Disease

## 2017-11-28 ENCOUNTER — Encounter: Payer: Self-pay | Admitting: Cardiovascular Disease

## 2017-11-28 VITALS — BP 120/74 | HR 65 | Ht 73.0 in | Wt 163.0 lb

## 2017-11-28 DIAGNOSIS — I255 Ischemic cardiomyopathy: Secondary | ICD-10-CM

## 2017-11-28 DIAGNOSIS — E785 Hyperlipidemia, unspecified: Secondary | ICD-10-CM

## 2017-11-28 DIAGNOSIS — I2102 ST elevation (STEMI) myocardial infarction involving left anterior descending coronary artery: Secondary | ICD-10-CM | POA: Diagnosis not present

## 2017-11-28 DIAGNOSIS — I313 Pericardial effusion (noninflammatory): Secondary | ICD-10-CM

## 2017-11-28 DIAGNOSIS — F17210 Nicotine dependence, cigarettes, uncomplicated: Secondary | ICD-10-CM

## 2017-11-28 DIAGNOSIS — I3139 Other pericardial effusion (noninflammatory): Secondary | ICD-10-CM

## 2017-11-28 NOTE — Assessment & Plan Note (Signed)
History of pericardial tamponade post anterior STEMI status post subxiphoid pericardial window placed by Dr. Cornelius Moras with follow-up echo showing no effusion.

## 2017-11-28 NOTE — Assessment & Plan Note (Signed)
History of CAD status post large anterior wall myocardial infarction 06/12/15 treated with PCI and drug-eluting stenting by myself of his LAD with a 2.5 x 15 mm long Xience drug-eluting stent postdilated to 2.8 mm. He developed 60% mid AV groove circumflex stenosis. He has had no recurrence that chest pain. He remains on dual antiplatelet therapy.

## 2017-11-28 NOTE — Patient Instructions (Signed)

## 2017-11-28 NOTE — Progress Notes (Signed)
11/28/2017 Devin Ellis   [birth year]  [id number]  Primary Physician Verl Bangs, MD Primary Cardiologist: Runell Gess MD Nicholes Calamity, [place]  HPI:  Devin Ellis is a 57 y.o.  thin-appearing married Caucasian male father of 5 children who works at [employer]. His primary care physician is Dr. Harland German at [facility] in [place]. I last saw him in the office 11/28/16. He had an acute anterior wall myocardial infarction on 06/12/15 treated with stenting of his mid LAD with a drug-eluting stent in the right radial approach by myself. His EF at that time was 25-35% with anteroapical wall motion and mobility. He had a 60% AV groove circumflex stenosis. His risk factor profile is notable for 80 pack years of tobacco smoking 2 packs a day up until the point now smoking 4 cigarettes a day. 2 weeks post discharge from the hospital he was admitted with pericardial tamponade due to hemorrhagic pericardial effusion undergoing a subxiphoid pericardial window by Dr. Cornelius Moras . Since discharge she still remarkably well. He has no shortness of breath or chest pain. I released him to go back to work.  A 2-D echo recently performed 11/06/16 revealed a decline in his ejection fraction to 30-35% range. Based on this, I referred him to Dr. Royann Shivers for evaluation of ICD implantation for primary prevention. This was performed successfully on 01/10/17 and he has been followed regularly since. [name] otherwise asymptomatic.     Current Meds  Medication Sig  . acetaminophen (TYLENOL) 325 MG tablet Take 2 tablets (650 mg total) by mouth every 4 (four) hours as needed for headache or mild pain.  [name] aspirin EC 81 MG tablet Take 81 mg by mouth daily.  [name] atorvastatin (LIPITOR) 10 MG tablet Take 1 tablet (10 mg total) by mouth daily.  . carvedilol (COREG) 12.5 MG tablet TAKE 1 TABLET (12.5 MG TOTAL) BY MOUTH 2 (TWO) TIMES DAILY.  [name] clopidogrel (PLAVIX) 75 MG tablet TAKE 1 TABLET (75 MG TOTAL) BY MOUTH DAILY.    . folic acid (FOLVITE) 1 MG tablet Take 1 mg by mouth daily.  [name] HYDROcodone-acetaminophen (NORCO) 7.5-325 MG tablet Take 5-325 tablets by mouth 2 (two) times daily as needed.  [name] lisinopril (PRINIVIL,ZESTRIL) 2.5 MG tablet Take 1 tablet (2.5 mg total) by mouth daily.  . methotrexate (RHEUMATREX) 2.5 MG tablet 7.5mg  in the morning and 7.5mg s at night on saturdays  . nitroGLYCERIN (NITROSTAT) 0.4 MG SL tablet Place 1 tablet (0.4 mg total) under the tongue every 5 (five) minutes as needed for chest pain.     No Known Allergies  Social History   Socioeconomic History  . Marital status: Married    Spouse name: Not on file  . Number of children: 5  . Years of education: Not on file  . Highest education level: Not on file  Social Needs  . Financial resource strain: Not on file  . Food insecurity - worry: Not on file  . Food insecurity - inability: Not on file  . Transportation needs - medical: Not on file  . Transportation needs - non-medical: Not on file  Occupational History  . 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 Smoking status: Current Every Day Smoker    Packs/day: 0.25    Years: 30.00    Pack years: 7.50    Types: Cigarettes  . Smokeless tobacco: Never Used  Substance and Sexual Activity  . Alcohol use: Not on file  .  Drug use: Not on file  . Sexual activity: Not on file  Other Topics Concern  . Not on file  Social History Narrative   Married.  Five children total between him and his wife.  He works for [employer] in [place].       Review of Systems: General: negative for chills, fever, night sweats or weight changes.  Cardiovascular: negative for chest pain, dyspnea on exertion, edema, orthopnea, palpitations, paroxysmal nocturnal dyspnea or shortness of breath Dermatological: negative for rash Respiratory: negative for cough or wheezing Urologic: negative for hematuria Abdominal: negative for nausea, vomiting, diarrhea,  bright red blood per rectum, melena, or hematemesis Neurologic: negative for visual changes, syncope, or dizziness All other systems reviewed and are otherwise negative except as noted above.    Blood pressure 120/74, pulse 65, height 6\' 1"  (1.854 m), weight 163 lb (73.9 kg).  General appearance: alert and no distress Neck: no adenopathy, no carotid bruit, no JVD, supple, symmetrical, trachea midline and thyroid not enlarged, symmetric, no tenderness/mass/nodules Lungs: clear to auscultation bilaterally Heart: regular rate and rhythm, S1, S2 normal, no murmur, click, rub or gallop Extremities: extremities normal, atraumatic, no cyanosis or edema Pulses: 2+ and symmetric Skin: Skin color, texture, turgor normal. No rashes or lesions Neurologic: Alert and oriented X 3, normal strength and tone. Normal symmetric reflexes. Normal coordination and gait  EKG sinus rhythm at 65 with anterior Q waves consistent with an old septal infarct. I personally reviewed this EKG.  ASSESSMENT AND PLAN:   ST elevation (STEMI) myocardial infarction involving left anterior descending coronary artery History of CAD status post large anterior wall myocardial infarction 06/12/15 treated with PCI and drug-eluting stenting by myself of his LAD with a 2.5 x 15 mm long Xience drug-eluting stent postdilated to 2.8 mm. He developed 60% mid AV groove circumflex stenosis. He has had no recurrence that chest pain. He remains on dual antiplatelet therapy.  Hyperlipidemia LDL goal <70 History of hyperlipidemia on statin therapy followed by his PCP  Cigarette nicotine dependence without complication History of continued tobacco abuse of 6 cigarettes a day down from 2 packs a day recalcitrant to risk factormodification  Ischemic cardiomyopathy History of ischemic cardiomyopathy with an EF in the 30% range by 2-D echo most recently checked 11/06/16. [name] on optimal medical therapy. He had an ICD implanted by Dr. 01/04/17 to at  my request on 01/10/17 and is followed remotely on a monthly basis. He has had no shocks.  Pericardial effusion History of pericardial tamponade post anterior STEMI status post subxiphoid pericardial window placed by Dr. 01/12/17 with follow-up echo showing no effusion.      Cornelius Moras MD FACP,FACC,FAHA, [facility] 11/28/2017 10:08 AM

## 2017-11-28 NOTE — Assessment & Plan Note (Signed)
History of continued tobacco abuse of 6 cigarettes a day down from 2 packs a day recalcitrant to risk factormodification

## 2017-11-28 NOTE — Assessment & Plan Note (Signed)
History of ischemic cardiomyopathy with an EF in the 30% range by 2-D echo most recently checked 11/06/16. [name] on optimal medical therapy. He had an ICD implanted by Dr. Royann Shivers to at my request on 01/10/17 and is followed remotely on a monthly basis. He has had no shocks.

## 2017-11-28 NOTE — Assessment & Plan Note (Signed)
History of hyperlipidemia on statin therapy followed by his PCP 

## 2017-12-26 ENCOUNTER — Ambulatory Visit (INDEPENDENT_AMBULATORY_CARE_PROVIDER_SITE_OTHER): Payer: BLUE CROSS/BLUE SHIELD | Admitting: *Deleted

## 2017-12-26 DIAGNOSIS — I255 Ischemic cardiomyopathy: Secondary | ICD-10-CM

## 2017-12-26 NOTE — Progress Notes (Signed)
Remote ICD transmission.   

## 2017-12-27 ENCOUNTER — Encounter: Payer: Self-pay | Admitting: Cardiology

## 2018-01-09 ENCOUNTER — Telehealth: Payer: Self-pay | Admitting: Cardiovascular Disease

## 2018-01-12 LAB — CUP PACEART REMOTE DEVICE CHECK
Battery Remaining Longevity: 133 mo
HighPow Impedance: 67 Ohm
Implantable Lead Implant Date: 20180314
Implantable Lead Location: 753860
Implantable Pulse Generator Implant Date: 20180314
Lead Channel Impedance Value: 494 Ohm
Lead Channel Pacing Threshold Amplitude: 0.625 V
Lead Channel Pacing Threshold Pulse Width: 0.4 ms
Lead Channel Setting Pacing Pulse Width: 0.4 ms
Lead Channel Setting Sensing Sensitivity: 0.3 mV
MDC IDC MSMT BATTERY VOLTAGE: 3.03 V
MDC IDC MSMT LEADCHNL RV IMPEDANCE VALUE: 570 Ohm
MDC IDC MSMT LEADCHNL RV SENSING INTR AMPL: 11.25 mV
MDC IDC MSMT LEADCHNL RV SENSING INTR AMPL: 11.25 mV
MDC IDC SESS DTM: 20190227122506
MDC IDC SET LEADCHNL RV PACING AMPLITUDE: 2.5 V
MDC IDC STAT BRADY RV PERCENT PACED: 0 %

## 2018-01-15 NOTE — Telephone Encounter (Signed)
Close  

## 2018-02-10 ENCOUNTER — Other Ambulatory Visit: Payer: Self-pay | Admitting: Cardiovascular Disease

## 2018-02-11 NOTE — Telephone Encounter (Signed)
Rx has been sent to the pharmacy electronically. ° °

## 2018-02-19 ENCOUNTER — Other Ambulatory Visit: Payer: Self-pay | Admitting: Cardiovascular Disease

## 2018-02-19 NOTE — Telephone Encounter (Signed)
rx sent to pharmacy

## 2018-03-13 ENCOUNTER — Ambulatory Visit (INDEPENDENT_AMBULATORY_CARE_PROVIDER_SITE_OTHER): Payer: BLUE CROSS/BLUE SHIELD | Admitting: Cardiovascular Disease

## 2018-03-13 ENCOUNTER — Encounter: Payer: Self-pay | Admitting: Cardiovascular Disease

## 2018-03-13 VITALS — BP 132/72 | HR 62 | Ht 73.0 in | Wt 163.8 lb

## 2018-03-13 DIAGNOSIS — I2510 Atherosclerotic heart disease of native coronary artery without angina pectoris: Secondary | ICD-10-CM

## 2018-03-13 DIAGNOSIS — I255 Ischemic cardiomyopathy: Secondary | ICD-10-CM

## 2018-03-13 DIAGNOSIS — E7800 Pure hypercholesterolemia, unspecified: Secondary | ICD-10-CM | POA: Diagnosis not present

## 2018-03-13 DIAGNOSIS — Z95810 Presence of automatic (implantable) cardiac defibrillator: Secondary | ICD-10-CM

## 2018-03-13 DIAGNOSIS — F17200 Nicotine dependence, unspecified, uncomplicated: Secondary | ICD-10-CM

## 2018-03-13 DIAGNOSIS — Z9189 Other specified personal risk factors, not elsewhere classified: Secondary | ICD-10-CM

## 2018-03-13 DIAGNOSIS — I5022 Chronic systolic (congestive) heart failure: Secondary | ICD-10-CM

## 2018-03-13 NOTE — Progress Notes (Signed)
Cardiology Office Note    Date:  03/13/2018   ID:  Devin Ellis, DOB 11/27/1960, MRN [id number]  PCP:  Verl Bangs, MD  Cardiologist:  Nanetta Batty, MD; Thurmon Fair, MD   No chief complaint on file.   History of Present Illness:  Devin Ellis is a 57 y.o. male with ischemic CMP (anterior MI 2016, EF 30-35% - last echo 11/06/2016, CHF NYHA class II) and returns in follow-up roughly one year following implantation of a defibrillator for primary prevention.  The patient specifically denies any chest pain at rest exertion, dyspnea at rest or with exertion, orthopnea, paroxysmal nocturnal dyspnea, syncope, palpitations, focal neurological deficits, intermittent claudication, lower extremity edema, unexplained weight gain, cough, hemoptysis or wheezing.  He has not required hospitalization for heart failure or any adjustment in his heart failure medications.  He does not require loop diuretics to maintain euvolemia.  [name] on the maximum tolerated doses of carvedilol and lisinopril, unfortunately only able to tolerate a very low dose of ACE inhibitor due to blood pressure.  Interrogation of his defibrillator shows normal device function.  He never requires ventricular pacing.  Lead parameters are excellent.  Estimated generator longevity is 10.9 months.  No therapy has been delivered.  For episodes of true nonsustained ventricular tachycardia, 10-15 beats, 180-200 bpm have been recorded.  They were appropriately diagnosed and categorized by his device, but no therapy was needed.  For the most part, thoracic impedance shows no evidence of volume overload, although he approached the arbitrary volume overload line around Christmas time.  [name] a long-standing smoker with 80-pack-year history but now only smokes a few cigarettes a day. Heart failure medicines are limited by relatively low blood pressure. He has a history of rheumatoid arthritis and had hemopericardium regarding surgical  window 2 weeks after his infarction in 2016.  Past Medical History:  Diagnosis Date  . CAD in native artery, residual 60 LCX 06/14/2015  . Cardiomyopathy, ischemic, EF by Echo 40-45% 06/14/2015  . Hyperlipidemia LDL goal <70 06/14/2015  . Pericardial effusion 06/26/2015  . Pericardial tamponade 06/26/2015  . Rheumatoid arthritis involving both hands (HCC) 04/2016  . S/P angioplasty with stent, to LAD with Xience DES 06/12/15  06/14/2015  . ST elevation (STEMI) myocardial infarction involving left anterior descending coronary artery (HCC) 06/12/2015  . Tobacco abuse     Past Surgical History:  Procedure Laterality Date  . CARDIAC CATHETERIZATION N/A 06/12/2015   Procedure: Left Heart Cath and Coronary Angiography;  Surgeon: Runell Gess, MD;  Location: [facility] INVASIVE CV LAB;  Service: Cardiovascular;  Laterality: N/A;  . CARDIAC CATHETERIZATION N/A 06/12/2015   Procedure: Coronary Stent Intervention;  Surgeon: Runell Gess, MD;  Location: [facility] INVASIVE CV LAB;  Service: Cardiovascular;  Laterality: N/A;  . ICD IMPLANT N/A 01/10/2017   Procedure: ICD Implant;  Surgeon: Thurmon Fair, MD;  Location: [facility] INVASIVE CV LAB;  Service: Cardiovascular;  Laterality: N/A;  . SHOULDER SURGERY Right    Torn bicep  . SUBXYPHOID PERICARDIAL WINDOW N/A 06/26/2015   Procedure: SUBXYPHOID PERICARDIAL WINDOW;  Surgeon: Purcell Nails, MD;  Location: [facility] OR;  Service: Thoracic;  Laterality: N/A;    Current Medications: Outpatient Medications Prior to Visit  Medication Sig Dispense Refill  . abatacept (ORENCIA) 250 MG injection Inject into the vein.    [name] acetaminophen (TYLENOL) 325 MG tablet Take 2 tablets (650 mg total) by mouth every 4 (four) hours as needed for headache or mild pain.    [name]  aspirin EC 81 MG tablet Take 81 mg by mouth daily.    [name] atorvastatin (LIPITOR) 10 MG tablet Take 1 tablet (10 mg total) by mouth daily. 90 tablet 3  . carvedilol (COREG) 12.5 MG tablet TAKE 1 TABLET (12.5 MG TOTAL) BY MOUTH  2 (TWO) TIMES DAILY. 180 tablet 3  . clopidogrel (PLAVIX) 75 MG tablet TAKE 1 TABLET (75 MG TOTAL) BY MOUTH DAILY. 90 tablet 2  . folic acid (FOLVITE) 1 MG tablet Take 1 mg by mouth daily.  3  . HYDROcodone-acetaminophen (NORCO) 7.5-325 MG tablet Take 1 tablet by mouth as directed.    [name] lisinopril (PRINIVIL,ZESTRIL) 2.5 MG tablet Take 1 tablet (2.5 mg total) by mouth daily. 90 tablet 3  . methotrexate (RHEUMATREX) 2.5 MG tablet 7.5mg  in the morning and 7.5mg s at night on saturdays    . nitroGLYCERIN (NITROSTAT) 0.4 MG SL tablet Place 1 tablet (0.4 mg total) under the tongue every 5 (five) minutes as needed for chest pain. 25 tablet 4  . HYDROcodone-acetaminophen (NORCO) 7.5-325 MG tablet Take 5-325 tablets by mouth 2 (two) times daily as needed.     No facility-administered medications prior to visit.      Allergies:   Patient has no known allergies.   Social History   Socioeconomic History  . Marital status: Married    Spouse name: Not on file  . Number of children: 5  . Years of education: Not on file  . Highest education level: Not on file  Occupational History  . 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 Financial resource strain: Not on file  . Food insecurity:    Worry: Not on file    Inability: Not on file  . Transportation needs:    Medical: Not on file    Non-medical: Not on file  Tobacco Use  . Smoking status: Current Every Day Smoker    Packs/day: 0.25    Years: 30.00    Pack years: 7.50    Types: Cigarettes  . Smokeless tobacco: Never Used  Substance and Sexual Activity  . Alcohol use: Not on file  . Drug use: Not on file  . Sexual activity: Not on file  Lifestyle  . Physical activity:    Days per week: Not on file    Minutes per session: Not on file  . Stress: Not on file  Relationships  . Social connections:    Talks on phone: Not on file    Gets together: Not on file    Attends religious service: Not on file     Active member of club or organization: Not on file    Attends meetings of clubs or organizations: Not on file    Relationship status: Not on file  Other Topics Concern  . Not on file  Social History Narrative   Married.  Five children total between him and his wife.  He works for [employer] in [place].       Family History:  The patient's family history includes Diabetes in his father and mother; Hypertension in his mother.   ROS:   Please see the history of present illness.    ROS All other systems reviewed and are negative.   PHYSICAL EXAM:   VS:  BP 132/72   Pulse 62   Ht 6\' 1"  (1.854 m)   Wt 163 lb 12.8 oz (74.3 kg)   BMI 21.61 kg/m      General:  Alert, oriented x3, no distress, he appears lean and fit Head: no evidence of trauma, PERRL, EOMI, no exophtalmos or lid lag, no myxedema, no xanthelasma; normal ears, nose and oropharynx Neck: normal jugular venous pulsations and no hepatojugular reflux; brisk carotid pulses without delay and no carotid bruits Chest: clear to auscultation, no signs of consolidation by percussion or palpation, normal fremitus, symmetrical and full respiratory excursions Cardiovascular: normal position and quality of the apical impulse, regular rhythm, normal first and second heart sounds, no murmurs, rubs or gallops, left subclavian defibrillator site well-healed Abdomen: no tenderness or distention, no masses by palpation, no abnormal pulsatility or arterial bruits, normal bowel sounds, no hepatosplenomegaly Extremities: no clubbing, cyanosis or edema; 2+ radial, ulnar and brachial pulses bilaterally; 2+ right femoral, posterior tibial and dorsalis pedis pulses; 2+ left femoral, posterior tibial and dorsalis pedis pulses; no subclavian or femoral bruits Neurological: grossly nonfocal Psych: Normal mood and affect    Wt Readings from Last 3 Encounters:  03/13/18 163 lb 12.8 oz (74.3 kg)  11/28/17 163 lb (73.9 kg)  03/28/17 165 lb 6.4 oz (75  kg)      Studies/Labs Reviewed:   EKG:  EKG is  ordered today.  The ekg ordered today demonstrates venous rhythm, Q waves of extensive old anterolateral myocardial infarction from V1 to V6, no acute repolarization abnormalities, QTC 391 ms.  Recent Labs: No results found for requested labs within last 8760 hours.   Lipid Panel    Component Value Date/Time   CHOL 125 06/14/2015 0406   TRIG 119 06/14/2015 0406   HDL 42 06/14/2015 0406   CHOLHDL 3.0 06/14/2015 0406   VLDL 24 06/14/2015 0406   LDLCALC 59 06/14/2015 0406     ASSESSMENT:    1. Chronic systolic (congestive) heart failure (HCC)   2. ICD (implantable cardioverter-defibrillator) in place   3. Coronary artery disease involving native coronary artery of native heart without angina pectoris   4. Smoking   5. Hypercholesterolemia      PLAN:  In order of problems listed above:  1. CHF: NYHA functional class I-2, euvolemic without diuretics. Meds limited by BP.  It is appealing to consider transition from lisinopril to [facility], but I am not sure if his blood pressure will tolerate it. 2. ICD: Normal device function re-visited instructions regarding his "shock plan", remote downloads every 3 months, yearly office visits 3. CAD: Asymptomatic at this time.  Does not have angina despite a reasonably active lifestyle.  On aspirin and statin. 4. Smoking: He smokes much less than he did in the past, but I strongly recommended that he completely stop smoking. 5. HLP: When last checked, LDL was well within target range, less than 70.    Medication Adjustments/Labs and Tests Ordered: Current medicines are reviewed at length with the patient today.  Concerns regarding medicines are outlined above.  Medication changes, Labs and Tests ordered today are listed in the Patient Instructions below. Patient Instructions  Dr Royann Shivers recommends that you continue on your current medications as directed. Please refer to the Current  Medication list given to you today.  Remote monitoring is used to monitor your Pacemaker or ICD from home. This monitoring reduces the number of office visits required to check your device to one time per year. It allows [place] to keep an eye on the functioning of your device to ensure it is working properly. You are scheduled for a device check from home on Wednesday, May 29th, 2019. You may send your  transmission at any time that day. If you have a wireless device, the transmission will be sent automatically. After your physician reviews your transmission, you will receive a notification with your next transmission date.  To improve our patient care and to more adequately follow your device, [facility] has decided, as a practice, to start following each patient four times a year with your home monitor. This means that you may experience a remote appointment that is close to an in-office appointment with your physician. Your insurance will apply at the same rate as other remote monitoring transmissions.  Dr Royann Shivers recommends that you schedule a follow-up appointment in 12 months with an ICD check. You will receive a reminder letter in the mail two months in advance. If you don't receive a letter, please call our office to schedule the follow-up appointment.  If you need a refill on your cardiac medications before your next appointment, please call your pharmacy.    Signed, Thurmon Fair, MD  03/13/2018 5:58 PM    [facility] [address] [place], [place], [place]  [address] Phone: [phone]; Fax: [phone]

## 2018-03-13 NOTE — Patient Instructions (Signed)
Dr Royann Shivers recommends that you continue on your current medications as directed. Please refer to the Current Medication list given to you today.  Remote monitoring is used to monitor your Pacemaker or ICD from home. This monitoring reduces the number of office visits required to check your device to one time per year. It allows [place] to keep an eye on the functioning of your device to ensure it is working properly. You are scheduled for a device check from home on Wednesday, May 29th, 2019. You may send your transmission at any time that day. If you have a wireless device, the transmission will be sent automatically. After your physician reviews your transmission, you will receive a notification with your next transmission date.  To improve our patient care and to more adequately follow your device, [facility] has decided, as a practice, to start following each patient four times a year with your home monitor. This means that you may experience a remote appointment that is close to an in-office appointment with your physician. Your insurance will apply at the same rate as other remote monitoring transmissions.  Dr Royann Shivers recommends that you schedule a follow-up appointment in 12 months with an ICD check. You will receive a reminder letter in the mail two months in advance. If you don't receive a letter, please call our office to schedule the follow-up appointment.  If you need a refill on your cardiac medications before your next appointment, please call your pharmacy.

## 2018-03-27 ENCOUNTER — Ambulatory Visit (INDEPENDENT_AMBULATORY_CARE_PROVIDER_SITE_OTHER): Payer: BLUE CROSS/BLUE SHIELD | Admitting: *Deleted

## 2018-03-27 DIAGNOSIS — Z9189 Other specified personal risk factors, not elsewhere classified: Secondary | ICD-10-CM

## 2018-03-27 DIAGNOSIS — I255 Ischemic cardiomyopathy: Secondary | ICD-10-CM | POA: Diagnosis not present

## 2018-03-27 NOTE — Progress Notes (Signed)
Remote ICD transmission.   

## 2018-03-29 LAB — CUP PACEART REMOTE DEVICE CHECK
Battery Voltage: 3.02 V
Brady Statistic RV Percent Paced: 0.01 %
HighPow Impedance: 71 Ohm
Implantable Lead Location: 753860
Lead Channel Impedance Value: 532 Ohm
Lead Channel Sensing Intrinsic Amplitude: 13.375 mV
Lead Channel Setting Pacing Amplitude: 2.5 V
Lead Channel Setting Sensing Sensitivity: 0.3 mV
MDC IDC LEAD IMPLANT DT: 20180314
MDC IDC MSMT BATTERY REMAINING LONGEVITY: 131 mo
MDC IDC MSMT LEADCHNL RV IMPEDANCE VALUE: 456 Ohm
MDC IDC MSMT LEADCHNL RV PACING THRESHOLD AMPLITUDE: 0.75 V
MDC IDC MSMT LEADCHNL RV PACING THRESHOLD PULSEWIDTH: 0.4 ms
MDC IDC MSMT LEADCHNL RV SENSING INTR AMPL: 13.375 mV
MDC IDC PG IMPLANT DT: 20180314
MDC IDC SESS DTM: 20190529073325
MDC IDC SET LEADCHNL RV PACING PULSEWIDTH: 0.4 ms

## 2018-04-02 ENCOUNTER — Encounter: Payer: Self-pay | Admitting: Cardiovascular Disease

## 2018-04-03 ENCOUNTER — Encounter: Payer: Self-pay | Admitting: Cardiovascular Disease

## 2018-04-28 ENCOUNTER — Encounter: Payer: Self-pay | Admitting: Cardiovascular Disease

## 2018-04-30 MED ORDER — ATORVASTATIN CALCIUM 10 MG PO TABS: 10.0000 mg | ORAL_TABLET | Freq: Every day | ORAL | 2 refills | Status: DC

## 2018-05-27 ENCOUNTER — Encounter: Payer: Self-pay | Admitting: Cardiovascular Disease

## 2018-06-26 ENCOUNTER — Ambulatory Visit (INDEPENDENT_AMBULATORY_CARE_PROVIDER_SITE_OTHER): Payer: Medicare Other | Admitting: *Deleted

## 2018-06-26 DIAGNOSIS — I255 Ischemic cardiomyopathy: Secondary | ICD-10-CM | POA: Diagnosis not present

## 2018-06-26 NOTE — Progress Notes (Signed)
Remote ICD transmission.   

## 2018-06-27 ENCOUNTER — Encounter: Payer: Self-pay | Admitting: Cardiology

## 2018-06-27 NOTE — Progress Notes (Signed)
Letter  

## 2018-07-16 LAB — CUP PACEART REMOTE DEVICE CHECK
Battery Remaining Longevity: 129 mo
Brady Statistic RV Percent Paced: 0.01 %
Date Time Interrogation Session: 20190828082504
HIGH POWER IMPEDANCE MEASURED VALUE: 79 Ohm
Implantable Lead Implant Date: 20180314
Lead Channel Impedance Value: 532 Ohm
Lead Channel Impedance Value: 589 Ohm
Lead Channel Pacing Threshold Amplitude: 0.5 V
Lead Channel Sensing Intrinsic Amplitude: 14.625 mV
Lead Channel Sensing Intrinsic Amplitude: 14.625 mV
Lead Channel Setting Pacing Pulse Width: 0.4 ms
MDC IDC LEAD LOCATION: 753860
MDC IDC MSMT BATTERY VOLTAGE: 3.02 V
MDC IDC MSMT LEADCHNL RV PACING THRESHOLD PULSEWIDTH: 0.4 ms
MDC IDC PG IMPLANT DT: 20180314
MDC IDC SET LEADCHNL RV PACING AMPLITUDE: 2.5 V
MDC IDC SET LEADCHNL RV SENSING SENSITIVITY: 0.3 mV

## 2018-08-06 ENCOUNTER — Other Ambulatory Visit: Payer: Self-pay

## 2018-08-06 MED ORDER — LISINOPRIL 2.5 MG PO TABS: 2.5000 mg | ORAL_TABLET | Freq: Every day | ORAL | 1 refills | Status: DC

## 2018-09-25 ENCOUNTER — Ambulatory Visit (INDEPENDENT_AMBULATORY_CARE_PROVIDER_SITE_OTHER): Payer: Medicare Other

## 2018-09-25 DIAGNOSIS — Z9189 Other specified personal risk factors, not elsewhere classified: Secondary | ICD-10-CM

## 2018-09-25 DIAGNOSIS — I255 Ischemic cardiomyopathy: Secondary | ICD-10-CM

## 2018-09-25 NOTE — Progress Notes (Signed)
Remote ICD transmission.   

## 2018-10-24 ENCOUNTER — Other Ambulatory Visit: Payer: Self-pay | Admitting: Cardiovascular Disease

## 2018-11-07 NOTE — Telephone Encounter (Signed)
Called patient, left voice mail msg indicating which Medicare Advantage plans we participate with.  Also left our contact info should he have more questions.

## 2018-11-15 LAB — CUP PACEART REMOTE DEVICE CHECK
Battery Remaining Longevity: 127 mo
Battery Voltage: 3.02 V
HighPow Impedance: 70 Ohm
Implantable Lead Implant Date: 20180314
Implantable Lead Location: 753860
Implantable Pulse Generator Implant Date: 20180314
Lead Channel Pacing Threshold Amplitude: 0.5 V
Lead Channel Pacing Threshold Pulse Width: 0.4 ms
Lead Channel Sensing Intrinsic Amplitude: 14.875 mV
Lead Channel Setting Pacing Amplitude: 2.5 V
Lead Channel Setting Pacing Pulse Width: 0.4 ms
MDC IDC MSMT LEADCHNL RV IMPEDANCE VALUE: 513 Ohm
MDC IDC MSMT LEADCHNL RV IMPEDANCE VALUE: 570 Ohm
MDC IDC MSMT LEADCHNL RV SENSING INTR AMPL: 14.875 mV
MDC IDC SESS DTM: 20191127062405
MDC IDC SET LEADCHNL RV SENSING SENSITIVITY: 0.3 mV
MDC IDC STAT BRADY RV PERCENT PACED: 0.01 %

## 2018-11-26 ENCOUNTER — Ambulatory Visit (INDEPENDENT_AMBULATORY_CARE_PROVIDER_SITE_OTHER): Payer: Medicare Other | Admitting: Cardiovascular Disease

## 2018-11-26 ENCOUNTER — Encounter: Payer: Self-pay | Admitting: Cardiovascular Disease

## 2018-11-26 VITALS — BP 106/78 | HR 68 | Ht 73.0 in | Wt 167.0 lb

## 2018-11-26 DIAGNOSIS — I2102 ST elevation (STEMI) myocardial infarction involving left anterior descending coronary artery: Secondary | ICD-10-CM

## 2018-11-26 DIAGNOSIS — I519 Heart disease, unspecified: Secondary | ICD-10-CM | POA: Diagnosis not present

## 2018-11-26 DIAGNOSIS — E785 Hyperlipidemia, unspecified: Secondary | ICD-10-CM

## 2018-11-26 DIAGNOSIS — F17210 Nicotine dependence, cigarettes, uncomplicated: Secondary | ICD-10-CM | POA: Diagnosis not present

## 2018-11-26 DIAGNOSIS — I5189 Other ill-defined heart diseases: Secondary | ICD-10-CM

## 2018-11-26 LAB — LIPID PANEL
CHOL/HDL RATIO: 3.1 ratio (ref 0.0–5.0)
CHOLESTEROL TOTAL: 159 mg/dL (ref 100–199)
HDL: 51 mg/dL (ref >39)
LDL Calculated: 51 mg/dL (ref 0–99)
TRIGLYCERIDES: 286 mg/dL — AB (ref 0–149)
VLDL Cholesterol Cal: 57 mg/dL — ABNORMAL HIGH (ref 5–40)

## 2018-11-26 LAB — HEPATIC FUNCTION PANEL
ALBUMIN: 4.2 g/dL (ref 3.8–4.9)
ALK PHOS: 83 IU/L (ref 39–117)
ALT: 28 IU/L (ref 0–44)
AST: 15 IU/L (ref 0–40)
Bilirubin Total: 0.3 mg/dL (ref 0.0–1.2)
Bilirubin, Direct: 0.09 mg/dL (ref 0.00–0.40)
Total Protein: 6.4 g/dL (ref 6.0–8.5)

## 2018-11-26 NOTE — Assessment & Plan Note (Signed)
History of ongoing tobacco abuse of 5 to 6 cigarettes a day recalcitrant to risk factor modification.

## 2018-11-26 NOTE — Assessment & Plan Note (Signed)
History of hyperlipidemia on statin therapy.  We will recheck a lipid and liver profile today 

## 2018-11-26 NOTE — Patient Instructions (Signed)
Medication Instructions:  NONE If you need a refill on your cardiac medications before your next appointment, please call your pharmacy.   Lab work: Your physician recommends that you HAVE lab work TODAY: LIPID/LIVER  If you have labs (blood work) drawn today and your tests are completely normal, you will receive your results only by: [name] MyChart Message (if you have MyChart) OR . A paper copy in the mail If you have any lab test that is abnormal or we need to change your treatment, we will call you to review the results.  Testing/Procedures: Your physician has requested that you have an echocardiogram. Echocardiography is a painless test that uses sound waves to create images of your heart. It provides your doctor with information about the size and shape of your heart and how well your heart's chambers and valves are working. This procedure takes approximately one hour. There are no restrictions for this procedure.    Follow-Up: At [facility], you and your health needs are our priority.  As part of our continuing mission to provide you with exceptional heart care, we have created designated Provider Care Teams.  These Care Teams include your primary Cardiologist (physician) and Advanced Practice Providers (APPs -  Physician Assistants and Nurse Practitioners) who all work together to provide you with the care you need, when you need it. . You will need a follow up appointment in 12 months.  Please call our office 2 months in advance to schedule this appointment.  You may see Dr. Allyson Sabal or one of the following Advanced Practice Providers on your designated Care Team:   . Corine Shelter, [place] . Azalee Course, PA-C . Micah Flesher, PA-C . Joni Reining, DNP . Theodore Demark, PA-C . Judy Pimple, PA-C . Marjie Skiff, PA-C  Any Other Special Instructions Will Be Listed Below (If Applicable). FOLLOW UP WITH DR. Royann Shivers FOR ICD CHECK UP

## 2018-11-26 NOTE — Assessment & Plan Note (Signed)
History of pericardial tamponade several weeks after his anterior STEMI treated with subxiphoid pericardial window by Dr. Cornelius Moras .

## 2018-11-26 NOTE — Assessment & Plan Note (Signed)
History of ischemic cardiomyopathy with an EF most recently checked by 2D echo 11/06/2016 at 30 to 35% with grade 2 diastolic dysfunction and apical akinesia.  [name] on carvedilol and lisinopril.  He has had an ICD placed by Dr. Royann Shivers for primary prevention.  He said no discharges.  We will recheck a 2D echocardiogram.

## 2018-11-26 NOTE — Progress Notes (Signed)
11/26/2018 Devin Ellis   [birth year]  [id number]  Primary Physician Verl Bangs, MD Primary Cardiologist: Runell Gess MD Nicholes Calamity, [place]  HPI:  Devin Ellis is a 58 y.o.  thin-appearing married Caucasian male father of 5 children who works at [employer]. His primary care physician is Dr. Harland German at [facility] in [place].I last saw him in the office  11/28/2017.He had an acute anterior wall myocardial infarction on 06/12/15 treated with stenting of his mid LAD with a drug-eluting stent in the right radial approach by myself. His EF at that time was 25-35% with anteroapical wall motion and mobility. He had a 60% AV groove circumflex stenosis. His risk factor profile is notable for 80 pack years of tobacco smoking 2 packs a day up until the point now smoking 4 cigarettes a day. 2 weeks post discharge from the hospital he was admitted with pericardial tamponade due to hemorrhagic pericardial effusion undergoing a subxiphoid pericardial window by Dr. Cornelius Moras . Since discharge she still remarkably well. He has no shortness of breath or chest pain. I released him to go back to work. A 2-D echo recently performed 11/06/16 revealed a decline in his ejection fraction to 30-35% range. Based on this, I referred him to Dr. Royann Shivers for evaluation of ICD implantation for primary prevention. This was performed successfully on 01/10/17 and he has been followed regularly since.   Since I saw him a year ago he is remained stable.  He denies chest pain or shortness of breath.  He does continue to smoke 5 to 6 cigarettes a day.  His ICD is checked quarterly remotely by Dr. Royann Shivers.  Current Meds  Medication Sig  . abatacept (ORENCIA) 250 MG injection Inject into the vein.  [name] acetaminophen (TYLENOL) 325 MG tablet Take 2 tablets (650 mg total) by mouth every 4 (four) hours as needed for headache or mild pain.  [name] aspirin EC 81 MG tablet Take 81 mg by mouth daily.  [name] atorvastatin (LIPITOR)  10 MG tablet Take 1 tablet (10 mg total) by mouth daily.  . carvedilol (COREG) 12.5 MG tablet TAKE 1 TABLET (12.5 MG TOTAL) BY MOUTH 2 (TWO) TIMES DAILY.  [name] clopidogrel (PLAVIX) 75 MG tablet TAKE 1 TABLET BY MOUTH EVERY DAY  . folic acid (FOLVITE) 1 MG tablet Take 1 mg by mouth daily.  [name] HYDROcodone-acetaminophen (NORCO) 7.5-325 MG tablet Take 1 tablet by mouth 3 (three) times daily.  [name] lisinopril (PRINIVIL,ZESTRIL) 2.5 MG tablet Take 1 tablet (2.5 mg total) by mouth daily.  . methotrexate (RHEUMATREX) 2.5 MG tablet 7.5mg  in the morning and 7.5mg s at night on saturdays  . nitroGLYCERIN (NITROSTAT) 0.4 MG SL tablet Place 1 tablet (0.4 mg total) under the tongue every 5 (five) minutes as needed for chest pain.  . Tofacitinib Citrate ER 11 MG TB24 Take 1 tablet by mouth daily.     No Known Allergies  Social History   Socioeconomic History  . Marital status: Married    Spouse name: Not on file  . Number of children: 5  . Years of education: Not on file  . Highest education level: Not on file  Occupational History  . 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 Financial resource strain: Not on file  . Food insecurity:    Worry: Not on file    Inability: Not on file  . Transportation needs:    Medical: Not on file  Non-medical: Not on file  Tobacco Use  . Smoking status: Current Every Day Smoker    Packs/day: 0.25    Years: 30.00    Pack years: 7.50    Types: Cigarettes  . Smokeless tobacco: Never Used  Substance and Sexual Activity  . Alcohol use: Not on file  . Drug use: Not on file  . Sexual activity: Not on file  Lifestyle  . Physical activity:    Days per week: Not on file    Minutes per session: Not on file  . Stress: Not on file  Relationships  . Social connections:    Talks on phone: Not on file    Gets together: Not on file    Attends religious service: Not on file    Active member of club or organization: Not on file     Attends meetings of clubs or organizations: Not on file    Relationship status: Not on file  . Intimate partner violence:    Fear of current or ex partner: Not on file    Emotionally abused: Not on file    Physically abused: Not on file    Forced sexual activity: Not on file  Other Topics Concern  . Not on file  Social History Narrative   Married.  Five children total between him and his wife.  He works for [employer] in [place].       Review of Systems: General: negative for chills, fever, night sweats or weight changes.  Cardiovascular: negative for chest pain, dyspnea on exertion, edema, orthopnea, palpitations, paroxysmal nocturnal dyspnea or shortness of breath Dermatological: negative for rash Respiratory: negative for cough or wheezing Urologic: negative for hematuria Abdominal: negative for nausea, vomiting, diarrhea, bright red blood per rectum, melena, or hematemesis Neurologic: negative for visual changes, syncope, or dizziness All other systems reviewed and are otherwise negative except as noted above.    Blood pressure 106/78, pulse 68, height 6\' 1"  (1.854 m), weight 167 lb (75.8 kg).  General appearance: alert and no distress Neck: no adenopathy, no carotid bruit, no JVD, supple, symmetrical, trachea midline and thyroid not enlarged, symmetric, no tenderness/mass/nodules Lungs: clear to auscultation bilaterally Heart: regular rate and rhythm, S1, S2 normal, no murmur, click, rub or gallop Extremities: extremities normal, atraumatic, no cyanosis or edema Pulses: 2+ and symmetric Skin: Skin color, texture, turgor normal. No rashes or lesions Neurologic: Alert and oriented X 3, normal strength and tone. Normal symmetric reflexes. Normal coordination and gait  EKG sinus rhythm at 68 with septal Q waves.  Personally reviewed this EKG.  ASSESSMENT AND PLAN:   ST elevation (STEMI) myocardial infarction involving left anterior descending coronary artery History of  CAD status post acute anterior wall STEMI 06/12/2015 treated with LAD stenting by myself using drug-eluting stent via the right radial approach.  His EF at that time was 25 to 35% with an anteroapical wall motion abnormality.  He did have 60% AV groove circumflex stenosis.  He denies chest pain or shortness of breath.  He remains on dual antiplatelet therapy with aspirin and clopidogrel.  Hyperlipidemia LDL goal <70 History of hyperlipidemia on statin therapy.  We will recheck a lipid and liver profile today.  Cigarette nicotine dependence without complication History of ongoing tobacco abuse of 5 to 6 cigarettes a day recalcitrant to risk factor modification.  Ischemic cardiomyopathy History of ischemic cardiomyopathy with an EF most recently checked by 2D echo 11/06/2016 at 30 to 35% with grade 2 diastolic dysfunction and  apical akinesia.  [name] on carvedilol and lisinopril.  He has had an ICD placed by Dr. Royann Shivers for primary prevention.  He said no discharges.  We will recheck a 2D echocardiogram.  Pericardial tamponade History of pericardial tamponade several weeks after his anterior STEMI treated with subxiphoid pericardial window by Dr. Cornelius Moras .      Runell Gess MD FACP,FACC,FAHA, [facility] 11/26/2018 10:55 AM

## 2018-11-26 NOTE — Assessment & Plan Note (Signed)
History of CAD status post acute anterior wall STEMI 06/12/2015 treated with LAD stenting by myself using drug-eluting stent via the right radial approach.  His EF at that time was 25 to 35% with an anteroapical wall motion abnormality.  He did have 60% AV groove circumflex stenosis.  He denies chest pain or shortness of breath.  He remains on dual antiplatelet therapy with aspirin and clopidogrel.

## 2018-11-27 ENCOUNTER — Telehealth: Payer: Self-pay | Admitting: *Deleted

## 2018-11-27 DIAGNOSIS — E782 Mixed hyperlipidemia: Secondary | ICD-10-CM

## 2018-11-27 MED ORDER — FENOFIBRATE 160 MG PO TABS: 160.0000 mg | ORAL_TABLET | Freq: Every day | ORAL | 11 refills | Status: DC

## 2018-11-27 NOTE — Telephone Encounter (Signed)
-----   Message from Runell Gess, MD sent at 11/27/2018  8:19 AM EST ----- Mr. Devin Ellis is at goal for secondary prevention with regards to his LDL.  His triglyceride levels are elevated.  Suggest adding fenofibrate 160 mg a day and recheck in 2 to 3 months.

## 2018-11-27 NOTE — Telephone Encounter (Signed)
Results released to my chart  New script sent to the pharmacy and Lab orders mailed to the pt. 

## 2018-12-10 ENCOUNTER — Ambulatory Visit (HOSPITAL_COMMUNITY): Payer: Medicare Other | Attending: Cardiology

## 2018-12-10 DIAGNOSIS — I519 Heart disease, unspecified: Secondary | ICD-10-CM | POA: Diagnosis not present

## 2018-12-10 DIAGNOSIS — I5189 Other ill-defined heart diseases: Secondary | ICD-10-CM

## 2018-12-10 MED ORDER — PERFLUTREN LIPID MICROSPHERE
1.0000 mL | INTRAVENOUS | Status: AC | PRN
Start: 2018-12-10 — End: 2018-12-10
  Administered 2018-12-10: 2 mL via INTRAVENOUS

## 2018-12-25 ENCOUNTER — Ambulatory Visit (INDEPENDENT_AMBULATORY_CARE_PROVIDER_SITE_OTHER): Payer: Medicare Other | Admitting: *Deleted

## 2018-12-25 DIAGNOSIS — I469 Cardiac arrest, cause unspecified: Secondary | ICD-10-CM

## 2018-12-25 DIAGNOSIS — I5022 Chronic systolic (congestive) heart failure: Secondary | ICD-10-CM | POA: Diagnosis not present

## 2018-12-26 LAB — CUP PACEART REMOTE DEVICE CHECK
Battery Remaining Longevity: 125 mo
Battery Voltage: 3.01 V
Brady Statistic RV Percent Paced: 0.01 %
Date Time Interrogation Session: 20200226062304
HighPow Impedance: 71 Ohm
Implantable Lead Location: 753860
Implantable Pulse Generator Implant Date: 20180314
Lead Channel Impedance Value: 570 Ohm
Lead Channel Sensing Intrinsic Amplitude: 12.625 mV
Lead Channel Setting Pacing Amplitude: 2.5 V
MDC IDC LEAD IMPLANT DT: 20180314
MDC IDC MSMT LEADCHNL RV IMPEDANCE VALUE: 494 Ohm
MDC IDC MSMT LEADCHNL RV PACING THRESHOLD AMPLITUDE: 0.625 V
MDC IDC MSMT LEADCHNL RV PACING THRESHOLD PULSEWIDTH: 0.4 ms
MDC IDC MSMT LEADCHNL RV SENSING INTR AMPL: 12.625 mV
MDC IDC SET LEADCHNL RV PACING PULSEWIDTH: 0.4 ms
MDC IDC SET LEADCHNL RV SENSING SENSITIVITY: 0.3 mV

## 2019-01-01 NOTE — Progress Notes (Signed)
Remote ICD transmission.   

## 2019-01-07 ENCOUNTER — Other Ambulatory Visit: Payer: Self-pay | Admitting: Cardiovascular Disease

## 2019-03-04 DIAGNOSIS — E782 Mixed hyperlipidemia: Secondary | ICD-10-CM | POA: Diagnosis not present

## 2019-03-04 LAB — LIPID PANEL
CHOL/HDL RATIO: 3.1 ratio (ref 0.0–5.0)
Cholesterol, Total: 142 mg/dL (ref 100–199)
HDL: 46 mg/dL (ref >39)
LDL Calculated: 76 mg/dL (ref 0–99)
Triglycerides: 99 mg/dL (ref 0–149)
VLDL Cholesterol Cal: 20 mg/dL (ref 5–40)

## 2019-03-04 LAB — HEPATIC FUNCTION PANEL
ALBUMIN: 4.5 g/dL (ref 3.8–4.9)
ALK PHOS: 61 IU/L (ref 39–117)
ALT: 20 IU/L (ref 0–44)
AST: 13 IU/L (ref 0–40)
BILIRUBIN TOTAL: 0.2 mg/dL (ref 0.0–1.2)
BILIRUBIN, DIRECT: 0.09 mg/dL (ref 0.00–0.40)
TOTAL PROTEIN: 6.7 g/dL (ref 6.0–8.5)

## 2019-03-27 ENCOUNTER — Ambulatory Visit (INDEPENDENT_AMBULATORY_CARE_PROVIDER_SITE_OTHER): Payer: Medicare Other | Admitting: *Deleted

## 2019-03-27 DIAGNOSIS — I255 Ischemic cardiomyopathy: Secondary | ICD-10-CM

## 2019-03-27 DIAGNOSIS — I469 Cardiac arrest, cause unspecified: Secondary | ICD-10-CM | POA: Diagnosis not present

## 2019-03-27 LAB — CUP PACEART REMOTE DEVICE CHECK
Battery Remaining Longevity: 123 mo
Battery Voltage: 3.01 V
Brady Statistic RV Percent Paced: 0.01 %
Date Time Interrogation Session: 20200528082505
HighPow Impedance: 72 Ohm
Implantable Lead Implant Date: 20180314
Implantable Lead Location: 753860
Implantable Pulse Generator Implant Date: 20180314
Lead Channel Impedance Value: 513 Ohm
Lead Channel Impedance Value: 570 Ohm
Lead Channel Pacing Threshold Amplitude: 0.5 V
Lead Channel Pacing Threshold Pulse Width: 0.4 ms
Lead Channel Sensing Intrinsic Amplitude: 13.875 mV
Lead Channel Setting Pacing Amplitude: 2.5 V
Lead Channel Setting Pacing Pulse Width: 0.4 ms
Lead Channel Setting Sensing Sensitivity: 0.3 mV

## 2019-03-29 ENCOUNTER — Other Ambulatory Visit: Payer: Self-pay | Admitting: Cardiovascular Disease

## 2019-04-01 ENCOUNTER — Telehealth: Payer: Self-pay | Admitting: Cardiovascular Disease

## 2019-04-01 NOTE — Telephone Encounter (Signed)
Pre-reg complete, video call, mychart active, verbal consent given 04/01/2019 MS

## 2019-04-02 ENCOUNTER — Encounter: Payer: Self-pay | Admitting: Cardiovascular Disease

## 2019-04-02 ENCOUNTER — Telehealth (INDEPENDENT_AMBULATORY_CARE_PROVIDER_SITE_OTHER): Payer: Medicare Other | Admitting: Cardiovascular Disease

## 2019-04-02 VITALS — Ht 73.0 in | Wt 167.0 lb

## 2019-04-02 DIAGNOSIS — E785 Hyperlipidemia, unspecified: Secondary | ICD-10-CM

## 2019-04-02 DIAGNOSIS — I2510 Atherosclerotic heart disease of native coronary artery without angina pectoris: Secondary | ICD-10-CM

## 2019-04-02 DIAGNOSIS — M0579 Rheumatoid arthritis with rheumatoid factor of multiple sites without organ or systems involvement: Secondary | ICD-10-CM

## 2019-04-02 DIAGNOSIS — I5042 Chronic combined systolic (congestive) and diastolic (congestive) heart failure: Secondary | ICD-10-CM

## 2019-04-02 DIAGNOSIS — Z95810 Presence of automatic (implantable) cardiac defibrillator: Secondary | ICD-10-CM | POA: Diagnosis not present

## 2019-04-02 NOTE — Patient Instructions (Signed)

## 2019-04-02 NOTE — Progress Notes (Signed)
Virtual Visit via Video Note   This visit type was conducted due to national recommendations for restrictions regarding the COVID-19 Pandemic (e.g. social distancing) in an effort to limit this patient's exposure and mitigate transmission in our community.  Due to his co-morbid illnesses, this patient is at least at moderate risk for complications without adequate follow up.  This format is felt to be most appropriate for this patient at this time.  All issues noted in this document were discussed and addressed.  A limited physical exam was performed with this format.  Please refer to the patient's chart for his consent to telehealth for [facility].   Date:  04/02/2019   ID:  Devin Ellis, DOB 02-11-1961, MRN [id number]  Patient Location: Home Provider Location: Home  PCP:  Verl Bangs, MD  Cardiologist:  Berry/ Garen Woolbright(ICD) Electrophysiologist:  None   Evaluation Performed:  Follow-Up Visit  Chief Complaint:  ICD follow up  History of Present Illness:    Devin Ellis is a 58 y.o. male with CAD and well compensated combined systolic and diastolic heart failure, single-chamber ICD (Medtronic 2018), rheumatoid arthritis on methotrexate, mixed hyperlipidemia.  He had a clinic visit with Dr. Allyson Sabal in January.  He was doing well from a cardiac point of view at that time and has not had any interim events.  He denies problems with chest pain or shortness of breath either at rest or with activity, claudication, peripheral edema, palpitations or syncope, defibrillator discharges, focal neurological events or bleeding.  Defibrillator check shows normal device function with anticipated generator longevity of 10 years and normal lead parameters.  He never requires pacing and his activity level is very constant at 3.4 hours/day over the last 52 weeks.  Thoracic impedance is very steady without any episodes of fluid accumulation.  There have been no episodes of nonsustained VT since his  last device download.  The patient does not have symptoms concerning for COVID-19 infection (fever, chills, cough, or new shortness of breath).   He presented in 2016 with an extensive acute anterior wall ST segment elevation myocardial infarction treated with emergency PCI and drug-eluting stent, also noted to have a 60% stenosis in the AV groove segment of the circumflex coronary artery, acute MI complicated by pericardial tamponade requiring surgical window. Primary prevention single-chamber Medtronic ICD implanted in 2018 as LVEF remains low at 30-35%, despite optimal medical therapy.  Past Medical History:  Diagnosis Date  . CAD in native artery, residual 60 LCX 06/14/2015  . Cardiomyopathy, ischemic, EF by Echo 40-45% 06/14/2015  . Hyperlipidemia LDL goal <70 06/14/2015  . Pericardial effusion 06/26/2015  . Pericardial tamponade 06/26/2015  . Rheumatoid arthritis involving both hands (HCC) 04/2016  . S/P angioplasty with stent, to LAD with Xience DES 06/12/15  06/14/2015  . ST elevation (STEMI) myocardial infarction involving left anterior descending coronary artery (HCC) 06/12/2015  . Tobacco abuse    Past Surgical History:  Procedure Laterality Date  . CARDIAC CATHETERIZATION N/A 06/12/2015   Procedure: Left Heart Cath and Coronary Angiography;  Surgeon: Runell Gess, MD;  Location: [facility] INVASIVE CV LAB;  Service: Cardiovascular;  Laterality: N/A;  . CARDIAC CATHETERIZATION N/A 06/12/2015   Procedure: Coronary Stent Intervention;  Surgeon: Runell Gess, MD;  Location: [facility] INVASIVE CV LAB;  Service: Cardiovascular;  Laterality: N/A;  . ICD IMPLANT N/A 01/10/2017   Procedure: ICD Implant;  Surgeon: Thurmon Fair, MD;  Location: [facility] INVASIVE CV LAB;  Service: Cardiovascular;  Laterality: N/A;  . SHOULDER  SURGERY Right    Torn bicep  . SUBXYPHOID PERICARDIAL WINDOW N/A 06/26/2015   Procedure: SUBXYPHOID PERICARDIAL WINDOW;  Surgeon: Purcell Nailslarence H Owen, MD;  Location: [facility] OR;  Service: Thoracic;   Laterality: N/A;     Current Meds  Medication Sig  . acetaminophen (TYLENOL) 325 MG tablet Take 2 tablets (650 mg total) by mouth every 4 (four) hours as needed for headache or mild pain.  [name]. aspirin EC 81 MG tablet Take 81 mg by mouth daily.  [name]. atorvastatin (LIPITOR) 10 MG tablet TAKE 1 TABLET BY MOUTH EVERY DAY  . carvedilol (COREG) 12.5 MG tablet TAKE 1 TABLET (12.5 MG TOTAL) BY MOUTH 2 (TWO) TIMES DAILY.  [name]. clopidogrel (PLAVIX) 75 MG tablet TAKE 1 TABLET BY MOUTH EVERY DAY  . fenofibrate 160 MG tablet Take 1 tablet (160 mg total) by mouth daily.  . folic acid (FOLVITE) 1 MG tablet Take 1 mg by mouth daily.  [name]. lisinopril (PRINIVIL,ZESTRIL) 2.5 MG tablet TAKE 1 TABLET BY MOUTH EVERY DAY  . methotrexate (RHEUMATREX) 2.5 MG tablet 7.5mg  in the morning and 7.5mg s at night on saturdays  . nitroGLYCERIN (NITROSTAT) 0.4 MG SL tablet Place 1 tablet (0.4 mg total) under the tongue every 5 (five) minutes as needed for chest pain.     Allergies:   Patient has no known allergies.   Social History   Tobacco Use  . Smoking status: Current Every Day Smoker    Packs/day: 0.25    Years: 30.00    Pack years: 7.50    Types: Cigarettes  . Smokeless tobacco: Never Used  Substance Use Topics  . Alcohol use: Not on file  . Drug use: Not on file     Family Hx: The patient's family history includes Diabetes in his father and mother; Hypertension in his mother. There is no history of Heart attack or Stroke.  ROS:   Please see the history of present illness.     All other systems reviewed and are negative.   Prior CV studies:   The following studies were reviewed today:  Follow-up echocardiogram performed 12/10/2018 shows unchanged left ventricular systolic function with LVEF 30-35% and apical akinesis.  Labs/Other Tests and Data Reviewed:    EKG:  An ECG dated 11/26/2018 was personally reviewed today and demonstrated:  Sinus rhythm, QS pattern in leads V1-V2 and small Q wave in V3  consistent with old anteroseptal infarction, no ischemic repolarization changes, QTC 391 ms  Recent Labs: 03/04/2019: ALT 20   Recent Lipid Panel Lab Results  Component Value Date/Time   CHOL 142 03/04/2019 08:59 AM   TRIG 99 03/04/2019 08:59 AM   HDL 46 03/04/2019 08:59 AM   CHOLHDL 3.1 03/04/2019 08:59 AM   CHOLHDL 3.0 06/14/2015 04:06 AM   LDLCALC 76 03/04/2019 08:59 AM    Wt Readings from Last 3 Encounters:  04/02/19 167 lb (75.8 kg)  11/26/18 167 lb (75.8 kg)  03/13/18 163 lb 12.8 oz (74.3 kg)     Objective:    Vital Signs:  Ht 6\' 1"  (1.854 m)   Wt 167 lb (75.8 kg)   BMI 22.03 kg/m    VITAL SIGNS:  reviewed GEN:  no acute distress EYES:  sclerae anicteric, EOMI - Extraocular Movements Intact RESPIRATORY:  normal respiratory effort, symmetric expansion CARDIOVASCULAR:  no peripheral edema SKIN:  no rash, lesions or ulcers. MUSCULOSKELETAL:  no obvious deformities. NEURO:  alert and oriented x 3, no obvious focal deficit PSYCH:  normal affect  ASSESSMENT &  PLAN:    1. CHF: LVEF remains low at 30-35%, but he has NYHA functional class I and appears to be euvolemic without the use of loop diuretics.  [name] on appropriate treatment with carvedilol and ACE inhibitors.  Consider switching to [facility] if there is any evidence of clinical instability. 2. CAD: Denies angina.  On aspirin, clopidogrel and statin. 3. HLP: Lipid parameters at goal or very close to goal on last assessment. 4. ICD: Normal device function, continue remote downloads every 3 months. 5. Smoking: Strongly encouraged complete smoking cessation (he still smokes 5 cigarettes a day) and discussed ways for him to achieve and maintain this goal.  COVID-19 Education: The signs and symptoms of COVID-19 were discussed with the patient and how to seek care for testing (follow up with PCP or arrange E-visit).  The importance of social distancing was discussed today.  Time:   Today, I have spent 16 minutes with  the patient with telehealth technology discussing the above problems.     Medication Adjustments/Labs and Tests Ordered: Current medicines are reviewed at length with the patient today.  Concerns regarding medicines are outlined above.   Tests Ordered: No orders of the defined types were placed in this encounter.   Medication Changes: No orders of the defined types were placed in this encounter.   Disposition:  Follow up 12 months  Signed, Thurmon Fair, MD  04/02/2019 9:55 AM    [facility]

## 2019-04-03 ENCOUNTER — Encounter: Payer: Self-pay | Admitting: Cardiology

## 2019-04-03 NOTE — Progress Notes (Signed)
Remote ICD transmission.   

## 2019-04-28 DIAGNOSIS — M545 Low back pain: Secondary | ICD-10-CM | POA: Diagnosis not present

## 2019-05-01 DIAGNOSIS — M0579 Rheumatoid arthritis with rheumatoid factor of multiple sites without organ or systems involvement: Secondary | ICD-10-CM | POA: Diagnosis not present

## 2019-05-01 DIAGNOSIS — Z79899 Other long term (current) drug therapy: Secondary | ICD-10-CM | POA: Diagnosis not present

## 2019-06-25 ENCOUNTER — Other Ambulatory Visit: Payer: Self-pay | Admitting: Cardiovascular Disease

## 2019-06-26 ENCOUNTER — Ambulatory Visit (INDEPENDENT_AMBULATORY_CARE_PROVIDER_SITE_OTHER): Payer: Medicare Other | Admitting: *Deleted

## 2019-06-26 DIAGNOSIS — I255 Ischemic cardiomyopathy: Secondary | ICD-10-CM | POA: Diagnosis not present

## 2019-06-26 DIAGNOSIS — I469 Cardiac arrest, cause unspecified: Secondary | ICD-10-CM

## 2019-06-26 LAB — CUP PACEART REMOTE DEVICE CHECK
Battery Remaining Longevity: 121 mo
Battery Voltage: 3.01 V
Brady Statistic RV Percent Paced: 0.01 %
Date Time Interrogation Session: 20200827082405
HighPow Impedance: 74 Ohm
Implantable Lead Implant Date: 20180314
Implantable Lead Location: 753860
Implantable Pulse Generator Implant Date: 20180314
Lead Channel Impedance Value: 494 Ohm
Lead Channel Impedance Value: 513 Ohm
Lead Channel Pacing Threshold Amplitude: 0.5 V
Lead Channel Pacing Threshold Pulse Width: 0.4 ms
Lead Channel Sensing Intrinsic Amplitude: 14.25 mV
Lead Channel Setting Pacing Amplitude: 2.5 V
Lead Channel Setting Pacing Pulse Width: 0.4 ms
Lead Channel Setting Sensing Sensitivity: 0.3 mV

## 2019-07-03 NOTE — Progress Notes (Signed)
Remote ICD transmission.   

## 2019-09-24 DIAGNOSIS — Z79899 Other long term (current) drug therapy: Secondary | ICD-10-CM | POA: Diagnosis not present

## 2019-09-24 DIAGNOSIS — F1120 Opioid dependence, uncomplicated: Secondary | ICD-10-CM | POA: Diagnosis not present

## 2019-09-24 DIAGNOSIS — M0579 Rheumatoid arthritis with rheumatoid factor of multiple sites without organ or systems involvement: Secondary | ICD-10-CM | POA: Diagnosis not present

## 2019-09-29 ENCOUNTER — Ambulatory Visit (INDEPENDENT_AMBULATORY_CARE_PROVIDER_SITE_OTHER): Payer: Medicare Other | Admitting: *Deleted

## 2019-09-29 DIAGNOSIS — I5042 Chronic combined systolic (congestive) and diastolic (congestive) heart failure: Secondary | ICD-10-CM

## 2019-09-29 LAB — CUP PACEART REMOTE DEVICE CHECK
Battery Remaining Longevity: 119 mo
Battery Voltage: 3.01 V
Brady Statistic RV Percent Paced: 0 %
Date Time Interrogation Session: 20201130001603
HighPow Impedance: 77 Ohm
Implantable Lead Implant Date: 20180314
Implantable Lead Location: 753860
Implantable Pulse Generator Implant Date: 20180314
Lead Channel Impedance Value: 494 Ohm
Lead Channel Impedance Value: 570 Ohm
Lead Channel Pacing Threshold Amplitude: 0.5 V
Lead Channel Pacing Threshold Pulse Width: 0.4 ms
Lead Channel Sensing Intrinsic Amplitude: 12.5 mV
Lead Channel Sensing Intrinsic Amplitude: 12.5 mV
Lead Channel Setting Pacing Amplitude: 2.5 V
Lead Channel Setting Pacing Pulse Width: 0.4 ms
Lead Channel Setting Sensing Sensitivity: 0.3 mV

## 2019-09-30 ENCOUNTER — Other Ambulatory Visit: Payer: Self-pay | Admitting: Cardiovascular Disease

## 2019-10-13 ENCOUNTER — Ambulatory Visit: Payer: Medicare Other

## 2019-10-14 ENCOUNTER — Ambulatory Visit: Payer: Medicare Other

## 2019-10-16 ENCOUNTER — Telehealth: Payer: Self-pay | Admitting: Cardiovascular Disease

## 2019-10-16 NOTE — Telephone Encounter (Signed)
  Wife is calling because patient has a sinus infection and they would like to know what he can safely take for it.

## 2019-10-16 NOTE — Telephone Encounter (Signed)
Pt encouraged to be seen by PCP d/t ear drainage and pressure. Wife voiced understanding.

## 2019-10-22 NOTE — Progress Notes (Signed)
ICD remote 

## 2019-10-30 ENCOUNTER — Ambulatory Visit
Admission: EM | Admit: 2019-10-30 | Discharge: 2019-10-30 | Disposition: A | Discharge status: Home / Self Care | Payer: Medicare Other | Attending: Physician Assistant | Admitting: Physician Assistant

## 2019-10-30 ENCOUNTER — Other Ambulatory Visit: Payer: Self-pay

## 2019-10-30 DIAGNOSIS — H9201 Otalgia, right ear: Secondary | ICD-10-CM | POA: Diagnosis not present

## 2019-10-30 DIAGNOSIS — H60311 Diffuse otitis externa, right ear: Secondary | ICD-10-CM | POA: Diagnosis not present

## 2019-10-30 MED ORDER — NEOMYCIN-POLYMYXIN-HC 3.5-10000-1 OT SUSP: 4.0000 [drp] | Freq: Three times a day (TID) | OTIC | 0 refills | Status: AC

## 2019-10-30 MED ORDER — AZELASTINE HCL 0.1 % NA SOLN: 2.0000 | Freq: Two times a day (BID) | NASAL | 0 refills | Status: DC

## 2019-10-30 NOTE — ED Provider Notes (Signed)
[facility]    CSN: [id number] Arrival date & time: 10/30/19  0908      History   Chief Complaint Chief Complaint  Patient presents with  . Otalgia    HPI Devin Ellis is a 58 y.o. male.   58 year old male comes in for 1 month history of right ear pain, right ear fullness, muffled hearing.  States had some URI symptoms such as rhinorrhea, nasal congestion, but this has completely resolved a few weeks ago.  Denies any current URI symptoms.  Denies fever, chills, body aches.  Denies shortness of breath, loss of taste or smell.  Had mild ear drainage 3 to 4 days ago that has since resolved. Denies injury/trauma to the ear.      Past Medical History:  Diagnosis Date  . CAD in native artery, residual 60 LCX 06/14/2015  . Cardiomyopathy, ischemic, EF by Echo 40-45% 06/14/2015  . Hyperlipidemia LDL goal <70 06/14/2015  . Pericardial effusion 06/26/2015  . Pericardial tamponade 06/26/2015  . Rheumatoid arthritis involving both hands (HCC) 04/2016  . S/P angioplasty with stent, to LAD with Xience DES 06/12/15  06/14/2015  . ST elevation (STEMI) myocardial infarction involving left anterior descending coronary artery (HCC) 06/12/2015  . Tobacco abuse     Patient Active Problem List   Diagnosis Date Noted  . Tobacco abuse 03/25/2017  . ICD (implantable cardioverter-defibrillator) in place, placed 01/10/17 MTD 01/10/2017  . At risk for sudden cardiac death 15-Jan-2017  . Rheumatoid arthritis involving multiple sites with positive rheumatoid factor (HCC) 05/19/2016  . History of acute anterior wall myocardial infarction 07/06/2015  . STEMI (ST elevation myocardial infarction) (HCC) 07/06/2015  . Pericardial effusion 06/26/2015  . Pericardial tamponade 06/26/2015  . Chronic combined systolic and diastolic CHF (congestive heart failure) (HCC) 06/25/2015  . Hyperlipidemia LDL goal <70 06/14/2015  . Cigarette nicotine dependence without complication 06/14/2015  . S/P angioplasty  with stent, to LAD with Xience DES 06/12/15  06/14/2015  . CAD (coronary artery disease) 06/14/2015  . Ischemic cardiomyopathy   . ST elevation (STEMI) myocardial infarction involving left anterior descending coronary artery (HCC) 06/12/2015  . Rupture of right biceps tendon 04/27/2015    Past Surgical History:  Procedure Laterality Date  . CARDIAC CATHETERIZATION N/A 06/12/2015   Procedure: Left Heart Cath and Coronary Angiography;  Surgeon: Runell Gess, MD;  Location: [facility] INVASIVE CV LAB;  Service: Cardiovascular;  Laterality: N/A;  . CARDIAC CATHETERIZATION N/A 06/12/2015   Procedure: Coronary Stent Intervention;  Surgeon: Runell Gess, MD;  Location: [facility] INVASIVE CV LAB;  Service: Cardiovascular;  Laterality: N/A;  . ICD IMPLANT N/A 01/10/2017   Procedure: ICD Implant;  Surgeon: Thurmon Fair, MD;  Location: [facility] INVASIVE CV LAB;  Service: Cardiovascular;  Laterality: N/A;  . SHOULDER SURGERY Right    Torn bicep  . SUBXYPHOID PERICARDIAL WINDOW N/A 06/26/2015   Procedure: SUBXYPHOID PERICARDIAL WINDOW;  Surgeon: Purcell Nails, MD;  Location: [facility] OR;  Service: Thoracic;  Laterality: N/A;       Home Medications    Prior to Admission medications   Medication Sig Start Date End Date Taking? Authorizing Provider  acetaminophen (TYLENOL) 325 MG tablet Take 2 tablets (650 mg total) by mouth every 4 (four) hours as needed for headache or mild pain. 06/14/15   Leone Brand, NP  aspirin EC 81 MG tablet Take 81 mg by mouth daily.    [provider]  atorvastatin (LIPITOR) 10 MG tablet TAKE 1 TABLET BY  MOUTH EVERY DAY 09/30/19   Runell Gess, MD  azelastine (ASTELIN) 0.1 % nasal spray Place 2 sprays into both nostrils 2 (two) times daily. 10/30/19   Cathie Hoops, Latriece Anstine V, PA-C  carvedilol (COREG) 12.5 MG tablet TAKE 1 TABLET (12.5 MG TOTAL) BY MOUTH 2 (TWO) TIMES DAILY. 01/08/19   Runell Gess, MD  clopidogrel (PLAVIX) 75 MG tablet TAKE 1 TABLET BY MOUTH EVERY DAY 09/30/19   Runell Gess, MD  fenofibrate 160 MG tablet Take 1 tablet (160 mg total) by mouth daily. 11/27/18   Runell Gess, MD  folic acid (FOLVITE) 1 MG tablet Take 1 mg by mouth daily. 05/17/16   [provider]  lisinopril (ZESTRIL) 2.5 MG tablet TAKE 1 TABLET BY MOUTH EVERY DAY 06/25/19   Runell Gess, MD  methotrexate (RHEUMATREX) 2.5 MG tablet 7.5mg  in the morning and 7.5mg s at night on saturdays 06/29/16   [provider]  neomycin-polymyxin-hydrocortisone (CORTISPORIN) 3.5-10000-1 OTIC suspension Place 4 drops into the right ear 3 (three) times daily for 7 days. 10/30/19 11/06/19  Belinda Fisher, PA-C  nitroGLYCERIN (NITROSTAT) 0.4 MG SL tablet Place 1 tablet (0.4 mg total) under the tongue every 5 (five) minutes as needed for chest pain. 06/14/15   Leone Brand, NP    Family History Family History  Problem Relation Age of Onset  . Hypertension Mother   . Diabetes Mother   . Diabetes Father   . Heart attack Neg Hx   . Stroke Neg Hx     Social History Social History   Tobacco Use  . Smoking status: Current Every Day Smoker    Packs/day: 0.25    Years: 30.00    Pack years: 7.50    Types: Cigarettes  . Smokeless tobacco: Never Used  Substance Use Topics  . Alcohol use: Not Currently    Alcohol/week: 0.0 standard drinks  . Drug use: Never     Allergies   Patient has no known allergies.   Review of Systems Review of Systems  Reason unable to perform ROS: See HPI as above.     Physical Exam Triage Vital Signs ED Triage Vitals [10/30/19 1001]  Enc Vitals Group     BP 123/79     Pulse Rate 79     Resp 18     Temp 98.1 F (36.7 C)     Temp Source Oral     SpO2 98 %     Weight      Height      Head Circumference      Peak Flow      Pain Score 1     Pain Loc      Pain Edu?      Excl. in GC?    No data found.  Updated Vital Signs BP 123/79 (BP Location: Left Arm)   Pulse 79   Temp 98.1 F (36.7 C) (Oral)   Resp 18   SpO2 98%   Physical  Exam Constitutional:      General: [name] not in acute distress.    Appearance: [name] well-developed. [name] not diaphoretic.  HENT:     Head: Normocephalic and atraumatic.     Ears:     Comments: Right ear: Tragus tenderness to palpation with slight swelling.  Mild erythema and swelling to the ear canal opening.  No obvious erythema, swelling to the proximal ear canal.  Cerumen impaction, TM not visible.  Left ear: No tenderness to  palpation of tragus.  Normal ear canal.  Cerumen present at 6:00 region, TM partially visible, no erythema or bulging. Eyes:     Conjunctiva/sclera: Conjunctivae normal.     Pupils: Pupils are equal, round, and reactive to light.  Pulmonary:     Effort: Pulmonary effort is normal. No respiratory distress.  Skin:    General: Skin is warm and dry.  Neurological:     Mental Status: [name] alert and oriented to person, place, and time.      UC Treatments / Results  Labs (all labs ordered are listed, but only abnormal results are displayed) Labs Reviewed - No data to display  EKG   Radiology No results found.  Procedures Procedures (including critical care time)  Medications Ordered in UC Medications - No data to display  Initial Impression / Assessment and Plan / UC Course  I have reviewed the triage vital signs and the nursing notes.  Pertinent labs & imaging results that were available during my care of the patient were reviewed by me and considered in my medical decision making (see chart for details).    Post ear irrigation, right ear without residual cerumen, TM fully visible with bulging without erythema.  No obvious middle ear effusion.  Left ear with residual cerumen, patient unable to tolerate removal.  Will have patient use mineral oil to soften earwax prior to removal.  We will have patient start Cortisporin to cover for otitis externa given tragus/ear canal opening swelling, erythema, pain.  With patient start Flonase or Nasacort for  possible eustachian tube dysfunction contributing to symptoms.  Can add azelastine if symptoms do not improving.  Return precautions given.  Patient expresses understanding and agrees to plan.  Final Clinical Impressions(s) / UC Diagnoses   Final diagnoses:  Right ear pain  Acute diffuse otitis externa of right ear   ED Prescriptions    Medication Sig Dispense Auth. Provider   azelastine (ASTELIN) 0.1 % nasal spray Place 2 sprays into both nostrils 2 (two) times daily. 30 mL Jamin Humphries V, PA-C   neomycin-polymyxin-hydrocortisone (CORTISPORIN) 3.5-10000-1 OTIC suspension Place 4 drops into the right ear 3 (three) times daily for 7 days. 4.2 mL Ok Edwards, PA-C     PDMP not reviewed this encounter.   Ok Edwards, PA-C 10/30/19 1131

## 2019-10-30 NOTE — Discharge Instructions (Addendum)
Ear wax removed. Cortisporin for outer ear infection. Start flonase or nasacort as directed. Add azelastine if symptoms not improving.

## 2019-10-30 NOTE — ED Triage Notes (Signed)
Pt c/o rt ear pain and fullness for over a month.

## 2019-11-06 DIAGNOSIS — R0981 Nasal congestion: Secondary | ICD-10-CM | POA: Diagnosis not present

## 2019-11-07 ENCOUNTER — Ambulatory Visit: Payer: Medicare Other | Admitting: Cardiovascular Disease

## 2019-11-11 DIAGNOSIS — E785 Hyperlipidemia, unspecified: Secondary | ICD-10-CM | POA: Diagnosis not present

## 2019-11-11 DIAGNOSIS — Z0000 Encounter for general adult medical examination without abnormal findings: Secondary | ICD-10-CM | POA: Diagnosis not present

## 2019-11-11 DIAGNOSIS — R300 Dysuria: Secondary | ICD-10-CM | POA: Diagnosis not present

## 2019-11-11 DIAGNOSIS — M0579 Rheumatoid arthritis with rheumatoid factor of multiple sites without organ or systems involvement: Secondary | ICD-10-CM | POA: Diagnosis not present

## 2019-11-21 ENCOUNTER — Other Ambulatory Visit: Payer: Self-pay | Admitting: Cardiovascular Disease

## 2019-11-21 DIAGNOSIS — E782 Mixed hyperlipidemia: Secondary | ICD-10-CM

## 2019-11-25 ENCOUNTER — Ambulatory Visit: Payer: Medicare Other | Admitting: Cardiovascular Disease

## 2019-11-25 ENCOUNTER — Other Ambulatory Visit: Payer: Self-pay

## 2019-11-25 ENCOUNTER — Encounter: Payer: Self-pay | Admitting: Cardiovascular Disease

## 2019-11-25 VITALS — BP 129/74 | HR 74 | Temp 97.3°F | Ht 73.0 in | Wt 169.0 lb

## 2019-11-25 DIAGNOSIS — I469 Cardiac arrest, cause unspecified: Secondary | ICD-10-CM | POA: Diagnosis not present

## 2019-11-25 DIAGNOSIS — F17210 Nicotine dependence, cigarettes, uncomplicated: Secondary | ICD-10-CM

## 2019-11-25 DIAGNOSIS — I2510 Atherosclerotic heart disease of native coronary artery without angina pectoris: Secondary | ICD-10-CM | POA: Diagnosis not present

## 2019-11-25 DIAGNOSIS — E785 Hyperlipidemia, unspecified: Secondary | ICD-10-CM

## 2019-11-25 DIAGNOSIS — Z95810 Presence of automatic (implantable) cardiac defibrillator: Secondary | ICD-10-CM

## 2019-11-25 DIAGNOSIS — I2102 ST elevation (STEMI) myocardial infarction involving left anterior descending coronary artery: Secondary | ICD-10-CM

## 2019-11-25 DIAGNOSIS — I5042 Chronic combined systolic (congestive) and diastolic (congestive) heart failure: Secondary | ICD-10-CM

## 2019-11-25 DIAGNOSIS — I255 Ischemic cardiomyopathy: Secondary | ICD-10-CM | POA: Diagnosis not present

## 2019-11-25 DIAGNOSIS — I314 Cardiac tamponade: Secondary | ICD-10-CM

## 2019-11-25 NOTE — Assessment & Plan Note (Signed)
History of ischemic cardiomyopathy with an EF in the 30 to 35% range on appropriate medications.  [name] asymptomatic.

## 2019-11-25 NOTE — Assessment & Plan Note (Signed)
History of CAD status post acute anterior wall myocardial infarction 06/12/2015 treated with stenting of the mid LAD with a drug-eluting stent via the right radial approach by myself.  His EF at that time was 25 to 35% with anteroapical wall motion abnormality.  He had a 60% AV groove circumflex stenosis as well.  He denies chest pain or shortness of breath.  He remains on dual antiplatelet therapy.

## 2019-11-25 NOTE — Assessment & Plan Note (Signed)
History of ongoing tobacco use of 4 cigarettes a day.  He has a desire to quit.

## 2019-11-25 NOTE — Assessment & Plan Note (Signed)
She had pericardial tamponade status post subxiphoid window placed by Dr. Cornelius Moras in the past.  Echo performed 12/10/2018 revealed an EF of 30 to 35% with no evidence of pericardial effusion.

## 2019-11-25 NOTE — Assessment & Plan Note (Signed)
History of ICD implanted 01/10/2017 by Dr. Royann Shivers for primary prevention when she follows.  There have been no discharges.

## 2019-11-25 NOTE — Progress Notes (Signed)
11/25/2019 Devin Ellis   [birth year]  [id number]  Primary Physician Verl Bangs, MD Primary Cardiologist: Runell Gess MD Nicholes Calamity, [place]  HPI:  Devin Ellis is a 59 y.o.  HPI:  Devin Ellis is a 59 y.o.  thin-appearing married Caucasian male father of 5 children who works at [employer].  He just started seeing a new primary care physician at [facility]. I last saw him in the office  11/26/2018.He had an acute anterior wall myocardial infarction on 06/12/15 treated with stenting of his mid LAD with a drug-eluting stent in the right radial approach by myself. His EF at that time was 25-35% with anteroapical wall motion and mobility. He had a 60% AV groove circumflex stenosis. His risk factor profile is notable for 80 pack years of tobacco smoking 2 packs a day up until the point now smoking 4 cigarettes a day. 2 weeks post discharge from the hospital he was admitted with pericardial tamponade due to hemorrhagic pericardial effusion undergoing a subxiphoid pericardial window by Dr. Cornelius Moras . Since discharge she still remarkably well. He has no shortness of breath or chest pain. I released him to go back to work.A 2-D echo recently performed 11/06/16 revealed a decline in his ejection fraction to 30-35% range. Based on this, I referred him to Dr. Royann Shivers for evaluation of ICD implantation for primary prevention. This was performed successfully on 01/10/17 and he has been followed regularly since.   Since I saw him a year ago he is remained stable.  He denies chest pain or shortness of breath.  He does continue to smoke 5 to 6 cigarettes a day.  His ICD is checked quarterly remotely by Dr. Royann Shivers.    Current Meds  Medication Sig  . acetaminophen (TYLENOL) 325 MG tablet Take 2 tablets (650 mg total) by mouth every 4 (four) hours as needed for headache or mild pain.  [name] aspirin EC 81 MG tablet Take 81 mg by mouth daily.  [name] atorvastatin (LIPITOR) 10 MG tablet TAKE 1 TABLET BY  MOUTH EVERY DAY  . carvedilol (COREG) 12.5 MG tablet TAKE 1 TABLET (12.5 MG TOTAL) BY MOUTH 2 (TWO) TIMES DAILY.  [name] clopidogrel (PLAVIX) 75 MG tablet TAKE 1 TABLET BY MOUTH EVERY DAY  . cyclobenzaprine (FLEXERIL) 5 MG tablet Take by mouth.  . fenofibrate 160 MG tablet TAKE 1 TABLET BY MOUTH EVERY DAY  . folic acid (FOLVITE) 1 MG tablet Take 1 mg by mouth daily.  [name] HYDROcodone-acetaminophen (NORCO) 7.5-325 MG tablet Take by mouth.  [name] lisinopril (ZESTRIL) 2.5 MG tablet TAKE 1 TABLET BY MOUTH EVERY DAY  . methotrexate (RHEUMATREX) 2.5 MG tablet 7.5mg  in the morning and 7.5mg s at night on saturdays  . nitroGLYCERIN (NITROSTAT) 0.4 MG SL tablet Place 1 tablet (0.4 mg total) under the tongue every 5 (five) minutes as needed for chest pain.  . [DISCONTINUED] amoxicillin (AMOXIL) 875 MG tablet Take 875 mg by mouth 2 (two) times daily.  . [DISCONTINUED] azelastine (ASTELIN) 0.1 % nasal spray Place 2 sprays into both nostrils 2 (two) times daily.  . [DISCONTINUED] predniSONE (DELTASONE) 10 MG tablet TAKE THE FOLLOWING NUMBER OF TABLETS ONCE DAILY ON CONSECUTIVE DAYS  6 5 4 3 2 1   . [DISCONTINUED] triamcinolone (NASACORT) 55 MCG/ACT AERO nasal inhaler 2 sprays daily.     No Known Allergies  Social History   Socioeconomic History  . Marital status: Married    Spouse name: Not on file  . Number of children:  5  . Years of education: Not on file  . Highest education level: Not on file  Occupational History  . 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 Smoking status: Current Every Day Smoker    Packs/day: 0.25    Years: 30.00    Pack years: 7.50    Types: Cigarettes  . Smokeless tobacco: Never Used  Substance and Sexual Activity  . Alcohol use: Not Currently    Alcohol/week: 0.0 standard drinks  . Drug use: Never  . Sexual activity: Not on file  Other Topics Concern  . Not on file  Social History Narrative   Married.  Five children total between him and  his wife.  He works for [employer] in [employer] [place].     Social Determinants of Health   Financial Resource Strain:   . Difficulty of Paying Living Expenses: Not on file  Food Insecurity:   . Worried About Charity fundraiser in the Last Year: Not on file  . Ran Out of Food in the Last Year: Not on file  Transportation Needs:   . Lack of Transportation (Medical): Not on file  . Lack of Transportation (Non-Medical): Not on file  Physical Activity:   . Days of Exercise per Week: Not on file  . Minutes of Exercise per Session: Not on file  Stress:   . Feeling of Stress : Not on file  Social Connections:   . Frequency of Communication with Friends and Family: Not on file  . Frequency of Social Gatherings with Friends and Family: Not on file  . Attends Religious Services: Not on file  . Active Member of Clubs or Organizations: Not on file  . Attends Archivist Meetings: Not on file  . Marital Status: Not on file  Intimate Partner Violence:   . Fear of Current or Ex-Partner: Not on file  . Emotionally Abused: Not on file  . Physically Abused: Not on file  . Sexually Abused: Not on file     Review of Systems: General: negative for chills, fever, night sweats or weight changes.  Cardiovascular: negative for chest pain, dyspnea on exertion, edema, orthopnea, palpitations, paroxysmal nocturnal dyspnea or shortness of breath Dermatological: negative for rash Respiratory: negative for cough or wheezing Urologic: negative for hematuria Abdominal: negative for nausea, vomiting, diarrhea, bright red blood per rectum, melena, or hematemesis Neurologic: negative for visual changes, syncope, or dizziness All other systems reviewed and are otherwise negative except as noted above.    Blood pressure 129/74, pulse 74, temperature (!) 97.3 F (36.3 C), height 6\' 1"  (1.854 m), weight 169 lb (76.7 kg), SpO2 99 %.  General appearance: alert and no distress Neck: no adenopathy, no  carotid bruit, no JVD, supple, symmetrical, trachea midline and thyroid not enlarged, symmetric, no tenderness/mass/nodules Lungs: clear to auscultation bilaterally Heart: regular rate and rhythm, S1, S2 normal, no murmur, click, rub or gallop Extremities: extremities normal, atraumatic, no cyanosis or edema Pulses: 2+ and symmetric Skin: Skin color, texture, turgor normal. No rashes or lesions Neurologic: Alert and oriented X 3, normal strength and tone. Normal symmetric reflexes. Normal coordination and gait  EKG sinus rhythm at 77 with septal Q waves. I  Personally reviewed this EKG.  ASSESSMENT AND PLAN:   ST elevation (STEMI) myocardial infarction involving left anterior descending coronary artery History of CAD status post acute anterior wall myocardial infarction 06/12/2015 treated with stenting of the mid LAD with a drug-eluting  stent via the right radial approach by myself.  His EF at that time was 25 to 35% with anteroapical wall motion abnormality.  He had a 60% AV groove circumflex stenosis as well.  He denies chest pain or shortness of breath.  He remains on dual antiplatelet therapy.  Hyperlipidemia LDL goal <70 History of hyperlipidemia on statin therapy as well as fenofibrate with lipid profile performed 03/04/2019 revealing total 142, LDL 76 and HDL 46.  Cigarette nicotine dependence without complication History of ongoing tobacco use of 4 cigarettes a day.  He has a desire to quit.  Ischemic cardiomyopathy History of ischemic cardiomyopathy with an EF in the 30 to 35% range on appropriate medications.  [name] asymptomatic.  Pericardial tamponade She had pericardial tamponade status post subxiphoid window placed by Dr. Cornelius Moras in the past.  Echo performed 12/10/2018 revealed an EF of 30 to 35% with no evidence of pericardial effusion.  ICD (implantable cardioverter-defibrillator) in place, placed 01/10/17 MTD History of ICD implanted 01/10/2017 by Dr. Royann Shivers for primary  prevention when she follows.  There have been no discharges.      Runell Gess MD FACP,FACC,FAHA, [facility] 11/25/2019 9:24 AM

## 2019-11-25 NOTE — Assessment & Plan Note (Signed)
History of hyperlipidemia on statin therapy as well as fenofibrate with lipid profile performed 03/04/2019 revealing total 142, LDL 76 and HDL 46.

## 2019-11-25 NOTE — Patient Instructions (Signed)
Medication Instructions:  Your physician recommends that you continue on your current medications as directed. Please refer to the Current Medication list given to you today.  If you need a refill on your cardiac medications before your next appointment, please call your pharmacy.   Lab work: NONE  Testing/Procedures: NONE  Follow-Up: At [facility], you and your health needs are our priority.  As part of our continuing mission to provide you with exceptional heart care, we have created designated Provider Care Teams.  These Care Teams include your primary Cardiologist (physician) and Advanced Practice Providers (APPs -  Physician Assistants and Nurse Practitioners) who all work together to provide you with the care you need, when you need it. You may see Dr. Berry or one of the following Advanced Practice Providers on your designated Care Team:    Luke Kilroy, PA-C  Callie Goodrich, PA-C  Jesse Cleaver, FNP  Your physician wants you to follow-up in: 1 year with Dr. Berry       

## 2019-11-27 ENCOUNTER — Ambulatory Visit: Payer: Medicare Other

## 2019-12-16 ENCOUNTER — Other Ambulatory Visit: Payer: Self-pay | Admitting: Cardiovascular Disease

## 2019-12-25 DIAGNOSIS — M0579 Rheumatoid arthritis with rheumatoid factor of multiple sites without organ or systems involvement: Secondary | ICD-10-CM | POA: Diagnosis not present

## 2019-12-25 DIAGNOSIS — Z79891 Long term (current) use of opiate analgesic: Secondary | ICD-10-CM | POA: Diagnosis not present

## 2019-12-25 DIAGNOSIS — Z79899 Other long term (current) drug therapy: Secondary | ICD-10-CM | POA: Diagnosis not present

## 2019-12-29 ENCOUNTER — Ambulatory Visit (INDEPENDENT_AMBULATORY_CARE_PROVIDER_SITE_OTHER): Payer: Medicare Other | Admitting: *Deleted

## 2019-12-29 DIAGNOSIS — I5042 Chronic combined systolic (congestive) and diastolic (congestive) heart failure: Secondary | ICD-10-CM | POA: Diagnosis not present

## 2019-12-29 LAB — CUP PACEART REMOTE DEVICE CHECK
Battery Remaining Longevity: 116 mo
Battery Voltage: 3.01 V
Brady Statistic RV Percent Paced: 0.01 %
Date Time Interrogation Session: 20210301012305
HighPow Impedance: 75 Ohm
Implantable Lead Implant Date: 20180314
Implantable Lead Location: 753860
Implantable Pulse Generator Implant Date: 20180314
Lead Channel Impedance Value: 456 Ohm
Lead Channel Impedance Value: 570 Ohm
Lead Channel Pacing Threshold Amplitude: 0.625 V
Lead Channel Pacing Threshold Pulse Width: 0.4 ms
Lead Channel Sensing Intrinsic Amplitude: 15.875 mV
Lead Channel Sensing Intrinsic Amplitude: 15.875 mV
Lead Channel Setting Pacing Amplitude: 2.5 V
Lead Channel Setting Pacing Pulse Width: 0.4 ms
Lead Channel Setting Sensing Sensitivity: 0.3 mV

## 2019-12-30 NOTE — Progress Notes (Signed)
ICD Remote  

## 2020-03-15 ENCOUNTER — Other Ambulatory Visit: Payer: Self-pay | Admitting: Cardiovascular Disease

## 2020-03-31 ENCOUNTER — Ambulatory Visit (INDEPENDENT_AMBULATORY_CARE_PROVIDER_SITE_OTHER): Payer: Medicare Other | Admitting: *Deleted

## 2020-03-31 DIAGNOSIS — I255 Ischemic cardiomyopathy: Secondary | ICD-10-CM | POA: Diagnosis not present

## 2020-03-31 DIAGNOSIS — I5042 Chronic combined systolic (congestive) and diastolic (congestive) heart failure: Secondary | ICD-10-CM

## 2020-03-31 LAB — CUP PACEART REMOTE DEVICE CHECK
Battery Remaining Longevity: 111 mo
Battery Voltage: 3 V
Brady Statistic RV Percent Paced: 0.01 %
Date Time Interrogation Session: 20210602022608
HighPow Impedance: 76 Ohm
Implantable Lead Implant Date: 20180314
Implantable Lead Location: 753860
Implantable Pulse Generator Implant Date: 20180314
Lead Channel Impedance Value: 456 Ohm
Lead Channel Impedance Value: 532 Ohm
Lead Channel Pacing Threshold Amplitude: 0.625 V
Lead Channel Pacing Threshold Pulse Width: 0.4 ms
Lead Channel Sensing Intrinsic Amplitude: 16.375 mV
Lead Channel Sensing Intrinsic Amplitude: 16.375 mV
Lead Channel Setting Pacing Amplitude: 2.5 V
Lead Channel Setting Pacing Pulse Width: 0.4 ms
Lead Channel Setting Sensing Sensitivity: 0.3 mV

## 2020-04-02 NOTE — Progress Notes (Signed)
Remote ICD transmission.   

## 2020-05-11 ENCOUNTER — Encounter: Payer: Medicare Other | Admitting: Cardiovascular Disease

## 2020-05-31 ENCOUNTER — Encounter: Payer: Self-pay | Admitting: Cardiovascular Disease

## 2020-05-31 ENCOUNTER — Other Ambulatory Visit: Payer: Self-pay

## 2020-05-31 ENCOUNTER — Ambulatory Visit (INDEPENDENT_AMBULATORY_CARE_PROVIDER_SITE_OTHER): Payer: Medicare Other | Admitting: Cardiovascular Disease

## 2020-05-31 VITALS — BP 108/69 | HR 66 | Ht 73.0 in | Wt 173.4 lb

## 2020-05-31 DIAGNOSIS — I5042 Chronic combined systolic (congestive) and diastolic (congestive) heart failure: Secondary | ICD-10-CM

## 2020-05-31 DIAGNOSIS — Z95810 Presence of automatic (implantable) cardiac defibrillator: Secondary | ICD-10-CM

## 2020-05-31 DIAGNOSIS — I2510 Atherosclerotic heart disease of native coronary artery without angina pectoris: Secondary | ICD-10-CM | POA: Diagnosis not present

## 2020-05-31 DIAGNOSIS — E785 Hyperlipidemia, unspecified: Secondary | ICD-10-CM | POA: Diagnosis not present

## 2020-05-31 NOTE — Patient Instructions (Signed)

## 2020-06-01 NOTE — Progress Notes (Signed)
Cardiology Office Note    Date:  06/01/2020   ID:  Devin Ellis, DOB June 28, 1961, MRN [id number]  PCP:  Vivien Presto, MD  Cardiologist:  Nanetta Batty, MD; Thurmon Fair, MD   Chief Complaint  Patient presents with  . Pacemaker Check    ICD    History of Present Illness:  Devin Ellis is a 59 y.o. male with ischemic CMP (anterior MI 2016, EF 30-35% - last echo 11/06/2016, CHF NYHA class II) and returns in follow-up for his defibrillator.  He has been doing remote downloads faithfully.  He had his clinical follow-up with Dr. Nanetta Batty in January of this year.  He has not had any cardiac problems since then.  The patient specifically denies any chest pain at rest or with exertion, dyspnea at rest or with exertion, orthopnea, paroxysmal nocturnal dyspnea, syncope, palpitations, focal neurological deficits, intermittent claudication, lower extremity edema, unexplained weight gain, cough, hemoptysis or wheezing.  Device interrogation shows normal function.  His Medtronic Visia device was implanted in 2018 and has almost 9 more years of estimated longevity.  [name] only had one episode of nonsustained VT in the last 9 months, an 8 beat run at 222 beats a minute that occurred in May.  This was asymptomatic.  He does not require ventricular pacing.  There have been no episodes of atrial fibrillation.  His OptiVol was briefly out of range in June but is currently in normal range.  He has a history of rheumatoid arthritis and had hemopericardium regarding surgical window 2 weeks after his infarction in 2016.  Past Medical History:  Diagnosis Date  . CAD in native artery, residual 60 LCX 06/14/2015  . Cardiomyopathy, ischemic, EF by Echo 40-45% 06/14/2015  . Hyperlipidemia LDL goal <70 06/14/2015  . Pericardial effusion 06/26/2015  . Pericardial tamponade 06/26/2015  . Rheumatoid arthritis involving both hands (HCC) 04/2016  . S/P angioplasty with stent, to LAD with Xience DES 06/12/15   06/14/2015  . ST elevation (STEMI) myocardial infarction involving left anterior descending coronary artery (HCC) 06/12/2015  . Tobacco abuse     Past Surgical History:  Procedure Laterality Date  . CARDIAC CATHETERIZATION N/A 06/12/2015   Procedure: Left Heart Cath and Coronary Angiography;  Surgeon: Runell Gess, MD;  [facility] IN[facility] CV LAB;  Service: Cardiovascular;  Laterality: N/A;  . CARDIAC CATHETERIZATION N/A 06/12/2015   Procedure: Coronary Stent Intervention;  Surgeon: Runell Gess, MD;  Location: [facility] INVASIVE CV LAB;  Service: Cardiovascular;  Laterality: N/A;  . ICD IMPLANT N/A 01/10/2017   Procedure: ICD Implant;  Surgeon: Thurmon Fair, MD;  Location: [facility] INVASIVE CV LAB;  Service: Cardiovascular;  Laterality: N/A;  . SHOULDER SURGERY Right    Torn bicep  . SUBXYPHOID PERICARDIAL WINDOW N/A 06/26/2015   Procedure: SUBXYPHOID PERICARDIAL WINDOW;  Surgeon: Purcell Nails, MD;  Location: [facility] OR;  Service: Thoracic;  Laterality: N/A;    Current Medications: Outpatient Medications Prior to Visit  Medication Sig Dispense Refill  . acetaminophen (TYLENOL) 325 MG tablet Take 2 tablets (650 mg total) by mouth every 4 (four) hours as needed for headache or mild pain.    [name] aspirin EC 81 MG tablet Take 81 mg by mouth daily.    [name] atorvastatin (LIPITOR) 10 MG tablet TAKE 1 TABLET BY MOUTH EVERY DAY 90 tablet 2  . carvedilol (COREG) 12.5 MG tablet TAKE 1 TABLET BY MOUTH TWICE A DAY 180 tablet 3  . clopidogrel (PLAVIX) 75 MG tablet TAKE  1 TABLET BY MOUTH EVERY DAY 90 tablet 3  . fenofibrate 160 MG tablet TAKE 1 TABLET BY MOUTH EVERY DAY 90 tablet 3  . folic acid (FOLVITE) 1 MG tablet Take 1 mg by mouth daily.  3  . lisinopril (ZESTRIL) 2.5 MG tablet TAKE 1 TABLET BY MOUTH EVERY DAY 90 tablet 3  . methotrexate (RHEUMATREX) 2.5 MG tablet 7.5mg  in the morning and 7.5mg s at night on saturdays    . nitroGLYCERIN (NITROSTAT) 0.4 MG SL tablet Place 1 tablet (0.4 mg total) under the  tongue every 5 (five) minutes as needed for chest pain. 25 tablet 4  . cyclobenzaprine (FLEXERIL) 5 MG tablet Take by mouth. (Patient not taking: Reported on 05/31/2020)     No facility-administered medications prior to visit.     Allergies:   Patient has no known allergies.   Social History   Socioeconomic History  . Marital status: Married    Spouse name: Not on file  . Number of children: 5  . Years of education: Not on file  . Highest education level: Not on file  Occupational History  . 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 Smoking status: Current Every Day Smoker    Packs/day: 0.25    Years: 30.00    Pack years: 7.50    Types: Cigarettes  . Smokeless tobacco: Never Used  Substance and Sexual Activity  . Alcohol use: Not Currently    Alcohol/week: 0.0 standard drinks  . Drug use: Never  . Sexual activity: Not on file  Other Topics Concern  . Not on file  Social History Narrative   Married.  Five children total between him and his wife.  He works for [employer] in [place].     Social Determinants of Health   Financial Resource Strain:   . Difficulty of Paying Living Expenses:   Food Insecurity:   . Worried About Programme researcher, broadcasting/film/video in the Last Year:   . Barista in the Last Year:   Transportation Needs:   . Freight forwarder (Medical):   [name] Lack of Transportation (Non-Medical):   Physical Activity:   . Days of Exercise per Week:   . Minutes of Exercise per Session:   Stress:   . Feeling of Stress :   Social Connections:   . Frequency of Communication with Friends and Family:   . Frequency of Social Gatherings with Friends and Family:   . Attends Religious Services:   . Active Member of Clubs or Organizations:   . Attends Banker Meetings:   [name] Marital Status:      Family History:  The patient's family history includes Diabetes in his father and mother; Hypertension in his mother.    ROS:   Please see the history of present illness.    ROS All other systems are reviewed and are negative.  PHYSICAL EXAM:   VS:  BP 108/69   Pulse 66   Ht 6\' 1"  (1.854 m)   Wt 173 lb 6.4 oz (78.7 kg)   SpO2 100%   BMI 22.88 kg/m      General: Alert, oriented x3, no distress, appears lean and fit.  The left subclavian device site is healthy Head: no evidence of trauma, PERRL, EOMI, no exophtalmos or lid lag, no myxedema, no xanthelasma; normal ears, nose and oropharynx Neck: normal jugular venous pulsations and no hepatojugular reflux; brisk carotid pulses without delay and  no carotid bruits Chest: clear to auscultation, no signs of consolidation by percussion or palpation, normal fremitus, symmetrical and full respiratory excursions Cardiovascular: normal position and quality of the apical impulse, regular rhythm, normal first and second heart sounds, no murmurs, rubs or gallops Abdomen: no tenderness or distention, no masses by palpation, no abnormal pulsatility or arterial bruits, normal bowel sounds, no hepatosplenomegaly Extremities: no clubbing, cyanosis or edema; 2+ radial, ulnar and brachial pulses bilaterally; 2+ right femoral, posterior tibial and dorsalis pedis pulses; 2+ left femoral, posterior tibial and dorsalis pedis pulses; no subclavian or femoral bruits Neurological: grossly nonfocal Psych: Normal mood and affect    Wt Readings from Last 3 Encounters:  05/31/20 173 lb 6.4 oz (78.7 kg)  11/25/19 169 lb (76.7 kg)  04/02/19 167 lb (75.8 kg)      Studies/Labs Reviewed:   EKG:  EKG is  ordered today.  Shows normal sinus rhythms with Q waves in V1-V5, old T wave inversion 1 and aVL, QTC 423 ms but no acute ischemic abnormalities  Recent Labs: No results found for requested labs within last 8760 hours.   Lipid Panel    Component Value Date/Time   CHOL 142 03/04/2019 0859   TRIG 99 03/04/2019 0859   HDL 46 03/04/2019 0859   CHOLHDL 3.1 03/04/2019 0859    CHOLHDL 3.0 06/14/2015 0406   VLDL 24 06/14/2015 0406   LDLCALC 76 03/04/2019 0859     ASSESSMENT:    1. Chronic combined systolic and diastolic CHF (congestive heart failure) (HCC)   2. Coronary artery disease involving native coronary artery of native heart without angina pectoris   3. ICD (implantable cardioverter-defibrillator) in place   4. Hyperlipidemia LDL goal <70      PLAN:  In order of problems listed above:  1. CHF: NYHA functional class I, clinically euvolemic without the need for loop diuretics.  On relatively low-dose of ACE inhibitor due to blood pressure limitations.  On a moderate dose of carvedilol. 2. ICD: Normal device function, continue remote downloads every 3 months. 3. CAD: Angina free, on aspirin and statin.  [name] cut back further on his smoking, now only smoking 3 to 4 cigarettes a day, finds it hard to quit altogether, but he is still trying. 4. HLP:  LDL very close to target at 76.  Before that he was well within target range, but has gained a few pounds.    Medication Adjustments/Labs and Tests Ordered: Current medicines are reviewed at length with the patient today.  Concerns regarding medicines are outlined above.  Medication changes, Labs and Tests ordered today are listed in the Patient Instructions below. Patient Instructions  Medication Instructions:  No changes *If you need a refill on your cardiac medications before your next appointment, please call your pharmacy*   Lab Work: None ordered If you have labs (blood work) drawn today and your tests are completely normal, you will receive your results only by: [name] MyChart Message (if you have MyChart) OR . A paper copy in the mail If you have any lab test that is abnormal or we need to change your treatment, we will call you to review the results.   Testing/Procedures: None ordered   Follow-Up: At [facility], you and your health needs are our priority.  As part of our continuing  mission to provide you with exceptional heart care, we have created designated Provider Care Teams.  These Care Teams include your primary Cardiologist (physician) and Advanced Practice Providers (APPs -  Physician Assistants and Nurse Practitioners) who all work together to provide you with the care you need, when you need it.  We recommend signing up for the patient portal called "MyChart".  Sign up information is provided on this After Visit Summary.  MyChart is used to connect with patients for Virtual Visits (Telemedicine).  Patients are able to view lab/test results, encounter notes, upcoming appointments, etc.  Non-urgent messages can be sent to your provider as well.   To learn more about what you can do with MyChart, go to [email or web address].    Your next appointment:   12 month(s)  The format for your next appointment:   In Person  Provider:   Thurmon Fair, MD      Signed, Thurmon Fair, MD  06/01/2020 6:33 PM    [facility] [address] [place], [place], [place]  [address] Phone: [phone]; Fax: [phone]

## 2020-06-20 ENCOUNTER — Other Ambulatory Visit: Payer: Self-pay | Admitting: Cardiovascular Disease

## 2020-06-28 ENCOUNTER — Ambulatory Visit (INDEPENDENT_AMBULATORY_CARE_PROVIDER_SITE_OTHER): Payer: Medicare Other | Admitting: *Deleted

## 2020-06-28 DIAGNOSIS — I469 Cardiac arrest, cause unspecified: Secondary | ICD-10-CM

## 2020-06-28 LAB — CUP PACEART REMOTE DEVICE CHECK
Battery Remaining Longevity: 105 mo
Battery Voltage: 3 V
Brady Statistic RV Percent Paced: 0.01 %
Date Time Interrogation Session: 20210830022604
HighPow Impedance: 73 Ohm
Implantable Lead Implant Date: 20180314
Implantable Lead Location: 753860
Implantable Pulse Generator Implant Date: 20180314
Lead Channel Impedance Value: 456 Ohm
Lead Channel Impedance Value: 570 Ohm
Lead Channel Pacing Threshold Amplitude: 0.5 V
Lead Channel Pacing Threshold Pulse Width: 0.4 ms
Lead Channel Sensing Intrinsic Amplitude: 11.25 mV
Lead Channel Sensing Intrinsic Amplitude: 11.25 mV
Lead Channel Setting Pacing Amplitude: 2.5 V
Lead Channel Setting Pacing Pulse Width: 0.4 ms
Lead Channel Setting Sensing Sensitivity: 0.3 mV

## 2020-06-30 NOTE — Progress Notes (Signed)
Remote ICD transmission.   

## 2020-08-09 ENCOUNTER — Telehealth: Payer: Self-pay

## 2020-08-09 NOTE — Telephone Encounter (Signed)
I tried to help the pt send a transmission but was unsuccessful. I gave him the number to [facility] tech support.

## 2020-08-09 NOTE — Telephone Encounter (Addendum)
Carelink alert received 08/09/20 for presenting AF 100-140 bpm. No hx of AF on file.  No OAC noted on med list.   Called patient to discuss medications and symptoms.   No answer, LMOVM. Also attempted to call wife Toniann Fail, [place]), number is not in service.

## 2020-08-09 NOTE — Telephone Encounter (Signed)
Patient returned phone call. States he has felt fatigued but denies any complaints such as chest pain, shortness of breath, palpitations or other acute symptoms. Patient has attempted to send transmission, having issue with remote box. Patient and Devin Ellis, CMA are working to get remote box fixed. Advised patient I will forward this to the [facility] and someone will call him. Verbalized understanding.  ED precautions given with verbal understanding.

## 2020-08-09 NOTE — Telephone Encounter (Signed)
Patient wife Devin Ellis, [place] called back. Patient phone number is [phone]. Called patient to assess for s/s, medication compliance and request manual transmission.  No answer, LMOVM.

## 2020-08-10 NOTE — Telephone Encounter (Signed)
Called and left message for patient to call back to schedule appt. 

## 2020-08-11 NOTE — Telephone Encounter (Signed)
Called and reached patient.  Offered appt for Friday 08/13/20, Patient states he is unable to come for appt that day due to spouse's work schedule.  Pt aware of appt 08/17/20 at 9 am with Rudi Coco, NP.

## 2020-08-17 ENCOUNTER — Ambulatory Visit (HOSPITAL_COMMUNITY)
Admission: RE | Admit: 2020-08-17 | Discharge: 2020-08-17 | Disposition: A | Discharge status: Home / Self Care | Payer: Medicare Other | Source: Ambulatory Visit | Attending: Nurse Practitioner | Admitting: Nurse Practitioner

## 2020-08-17 ENCOUNTER — Encounter (HOSPITAL_COMMUNITY): Payer: Self-pay | Admitting: Nurse Practitioner

## 2020-08-17 ENCOUNTER — Other Ambulatory Visit: Payer: Self-pay

## 2020-08-17 VITALS — BP 120/80 | HR 76 | Ht 73.0 in | Wt 172.4 lb

## 2020-08-17 DIAGNOSIS — I2510 Atherosclerotic heart disease of native coronary artery without angina pectoris: Secondary | ICD-10-CM | POA: Diagnosis not present

## 2020-08-17 DIAGNOSIS — Z95810 Presence of automatic (implantable) cardiac defibrillator: Secondary | ICD-10-CM | POA: Insufficient documentation

## 2020-08-17 DIAGNOSIS — E785 Hyperlipidemia, unspecified: Secondary | ICD-10-CM | POA: Insufficient documentation

## 2020-08-17 DIAGNOSIS — Z7982 Long term (current) use of aspirin: Secondary | ICD-10-CM | POA: Insufficient documentation

## 2020-08-17 DIAGNOSIS — I255 Ischemic cardiomyopathy: Secondary | ICD-10-CM | POA: Diagnosis not present

## 2020-08-17 DIAGNOSIS — M069 Rheumatoid arthritis, unspecified: Secondary | ICD-10-CM | POA: Insufficient documentation

## 2020-08-17 DIAGNOSIS — Z79899 Other long term (current) drug therapy: Secondary | ICD-10-CM | POA: Diagnosis not present

## 2020-08-17 DIAGNOSIS — Z955 Presence of coronary angioplasty implant and graft: Secondary | ICD-10-CM | POA: Diagnosis not present

## 2020-08-17 DIAGNOSIS — I252 Old myocardial infarction: Secondary | ICD-10-CM | POA: Diagnosis not present

## 2020-08-17 DIAGNOSIS — Z7901 Long term (current) use of anticoagulants: Secondary | ICD-10-CM | POA: Diagnosis not present

## 2020-08-17 DIAGNOSIS — F17210 Nicotine dependence, cigarettes, uncomplicated: Secondary | ICD-10-CM | POA: Insufficient documentation

## 2020-08-17 DIAGNOSIS — I4891 Unspecified atrial fibrillation: Secondary | ICD-10-CM

## 2020-08-17 DIAGNOSIS — I480 Paroxysmal atrial fibrillation: Secondary | ICD-10-CM | POA: Insufficient documentation

## 2020-08-17 MED ORDER — APIXABAN 5 MG PO TABS: 5.0000 mg | ORAL_TABLET | Freq: Two times a day (BID) | ORAL | 3 refills | Status: DC

## 2020-08-17 NOTE — Patient Instructions (Signed)
You will start Eliquis 5mg  twice a day --- do not start until we talk with dr berry

## 2020-08-17 NOTE — Progress Notes (Signed)
Primary Care Physician: Vivien Presto, MD Referring Physician: Device clinic Cardiologists: Dr. Margo Aye. Croitoru    Devin Ellis is a 59 y.o. male with a h/o CAD, s/p remote MI with coronary stents, ischemic cardiomyopathy, ICD, tobacco abuse that is in the afib clinic for new onset afib with [facility] alert being received 08/09/20 with v rates of 100- 140 bpm. No DOAC on board but is taking asa and plavix for stents placed in 2016, no bleeding issues with these drugs. [name] in SR today.  He was not aware of the arrhythmia. continues to smoke a small amount, no excessive caffeine. No alcohol use. The wife denies he has snoring/apnea.   Today, he denies symptoms of palpitations, chest pain, shortness of breath, orthopnea, PND, lower extremity edema, dizziness, presyncope, syncope, or neurologic sequela. The patient is tolerating medications without difficulties and is otherwise without complaint today.   Past Medical History:  Diagnosis Date  . CAD in native artery, residual 60 LCX 06/14/2015  . Cardiomyopathy, ischemic, EF by Echo 40-45% 06/14/2015  . Hyperlipidemia LDL goal <70 06/14/2015  . Pericardial effusion 06/26/2015  . Pericardial tamponade 06/26/2015  . Rheumatoid arthritis involving both hands (HCC) 04/2016  . S/P angioplasty with stent, to LAD with Xience DES 06/12/15  06/14/2015  . ST elevation (STEMI) myocardial infarction involving left anterior descending coronary artery (HCC) 06/12/2015  . Tobacco abuse    Past Surgical History:  Procedure Laterality Date  . CARDIAC CATHETERIZATION N/A 06/12/2015   Procedure: Left Heart Cath and Coronary Angiography;  Surgeon: Runell Gess, MD;  Location: [facility] INVASIVE CV LAB;  Service: Cardiovascular;  Laterality: N/A;  . CARDIAC CATHETERIZATION N/A 06/12/2015   Procedure: Coronary Stent Intervention;  Surgeon: Runell Gess, MD;  Location: [facility] INVASIVE CV LAB;  Service: Cardiovascular;  Laterality: N/A;  . ICD IMPLANT N/A 01/10/2017     Procedure: ICD Implant;  Surgeon: Thurmon Fair, MD;  Location: [facility] INVASIVE CV LAB;  Service: Cardiovascular;  Laterality: N/A;  . SHOULDER SURGERY Right    Torn bicep  . SUBXYPHOID PERICARDIAL WINDOW N/A 06/26/2015   Procedure: SUBXYPHOID PERICARDIAL WINDOW;  Surgeon: Purcell Nails, MD;  Location: [facility] OR;  Service: Thoracic;  Laterality: N/A;    Current Outpatient Medications  Medication Sig Dispense Refill  . acetaminophen (TYLENOL) 325 MG tablet Take 2 tablets (650 mg total) by mouth every 4 (four) hours as needed for headache or mild pain.    [name] aspirin EC 81 MG tablet Take 81 mg by mouth daily.    [name] atorvastatin (LIPITOR) 10 MG tablet TAKE 1 TABLET BY MOUTH EVERY DAY 90 tablet 3  . carvedilol (COREG) 12.5 MG tablet TAKE 1 TABLET BY MOUTH TWICE A DAY 180 tablet 3  . famotidine (PEPCID) 40 MG tablet Take 40 mg by mouth daily.    . fenofibrate 160 MG tablet TAKE 1 TABLET BY MOUTH EVERY DAY 90 tablet 3  . folic acid (FOLVITE) 1 MG tablet Take 1 mg by mouth daily.  3  . HYDROcodone-acetaminophen (NORCO) 7.5-325 MG tablet Take 1 tablet by mouth every 6 (six) hours as needed.    [name] lisinopril (ZESTRIL) 2.5 MG tablet TAKE 1 TABLET BY MOUTH EVERY DAY 90 tablet 3  . methotrexate (RHEUMATREX) 2.5 MG tablet 7.5mg  in the morning and 7.5mg s at night on saturdays    . nitroGLYCERIN (NITROSTAT) 0.4 MG SL tablet Place 1 tablet (0.4 mg total) under the tongue every 5 (five) minutes as needed for chest pain. 25  tablet 4  . apixaban (ELIQUIS) 5 MG TABS tablet Take 1 tablet (5 mg total) by mouth 2 (two) times daily. 60 tablet 3   No current facility-administered medications for this encounter.    No Known Allergies  Social History   Socioeconomic History  . Marital status: Married    Spouse name: Not on file  . Number of children: 5  . Years of education: Not on file  . Highest education level: Not on file  Occupational History  . 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 Smoking status: Current Every Day Smoker    Packs/day: 0.25    Years: 30.00    Pack years: 7.50    Types: Cigarettes  . Smokeless tobacco: Never Used  Substance and Sexual Activity  . Alcohol use: Not Currently    Alcohol/week: 0.0 standard drinks  . Drug use: Never  . Sexual activity: Not on file  Other Topics Concern  . Not on file  Social History Narrative   Married.  Five children total between him and his wife.  He works for [employer] in [employer].     Social Determinants of Health   Financial Resource Strain:   . Difficulty of Paying Living Expenses: Not on file  Food Insecurity:   . Worried About Programme researcher, broadcasting/film/video in the Last Year: Not on file  . Ran Out of Food in the Last Year: Not on file  Transportation Needs:   . Lack of Transportation (Medical): Not on file  . Lack of Transportation (Non-Medical): Not on file  Physical Activity:   . Days of Exercise per Week: Not on file  . Minutes of Exercise per Session: Not on file  Stress:   . Feeling of Stress : Not on file  Social Connections:   . Frequency of Communication with Friends and Family: Not on file  . Frequency of Social Gatherings with Friends and Family: Not on file  . Attends Religious Services: Not on file  . Active Member of Clubs or Organizations: Not on file  . Attends Banker Meetings: Not on file  . Marital Status: Not on file  Intimate Partner Violence:   . Fear of Current or Ex-Partner: Not on file  . Emotionally Abused: Not on file  . Physically Abused: Not on file  . Sexually Abused: Not on file    Family History  Problem Relation Age of Onset  . Hypertension Mother   . Diabetes Mother   . Diabetes Father   . Heart attack Neg Hx   . Stroke Neg Hx     ROS- All systems are reviewed and negative except as per the HPI above  Physical Exam: Vitals:   08/17/20 0854  BP: 120/80  Pulse: 76  Weight: 78.2 kg  Height: 6\' 1"  (1.854 m)   Wt Readings  from Last 3 Encounters:  08/17/20 78.2 kg  05/31/20 78.7 kg  11/25/19 76.7 kg    Labs: Lab Results  Component Value Date   NA 137 01/03/2017   K 4.8 01/03/2017   CL 101 01/03/2017   CO2 30 01/03/2017   GLUCOSE 98 01/03/2017   BUN 12 01/03/2017   CREATININE 0.81 01/03/2017   CALCIUM 9.6 01/03/2017   Lab Results  Component Value Date   INR 1.1 01/03/2017   Lab Results  Component Value Date   CHOL 142 03/04/2019   HDL 46 03/04/2019   LDLCALC 76  03/04/2019   TRIG 99 03/04/2019     GEN- The patient is well appearing, alert and oriented x 3 today.   Head- normocephalic, atraumatic Eyes-  Sclera clear, conjunctiva pink Ears- hearing intact Oropharynx- clear Neck- supple, no JVP Lymph- no cervical lymphadenopathy Lungs- Clear to ausculation bilaterally, normal work of breathing Heart- Regular rate and rhythm, no murmurs, rubs or gallops, PMI not laterally displaced GI- soft, NT, ND, + BS Extremities- no clubbing, cyanosis, or edema MS- no significant deformity or atrophy Skin- no rash or lesion Psych- euthymic mood, full affect Neuro- strength and sensation are intact  EKG-  NSR at 76 bpm, pr int 142 ms, qrs int 98 ms, qtc 425 ms                                        Assessment and Plan: 1. New onset paroxysmal afib  General education re afib  Triggers discussed  Currently in SR  Continue carvedilol 12.5 mg bid  Encouraged  to stop smoking and avoid stimulants  2. CHA2DS2VASc score of 3(CAD, LV dysfunction, HF)  Discussed with Dr. Allyson Sabal He advises to stop Plavix, continue asa and I will start eliquis 5 mg bid  Bleeding precautions discussed  Most recent labs in [facility] (care everywhere) show a normal H/H and plt count Creatinine  is 0.89   I will see back in one month with bmet/cbc If afib burden escalates then will require further rate or rhythm control   Devin Ellis C. Matthew Folks [facility] [address] [place], [place]  [address] [phone]

## 2020-09-14 ENCOUNTER — Ambulatory Visit (HOSPITAL_COMMUNITY)
Admission: RE | Admit: 2020-09-14 | Discharge: 2020-09-14 | Disposition: A | Discharge status: Home / Self Care | Payer: Medicare Other | Source: Ambulatory Visit | Attending: Nurse Practitioner | Admitting: Nurse Practitioner

## 2020-09-14 ENCOUNTER — Encounter (HOSPITAL_COMMUNITY): Payer: Self-pay | Admitting: Nurse Practitioner

## 2020-09-14 ENCOUNTER — Other Ambulatory Visit: Payer: Self-pay

## 2020-09-14 VITALS — BP 116/80 | HR 77 | Ht 73.0 in | Wt 173.0 lb

## 2020-09-14 DIAGNOSIS — I4891 Unspecified atrial fibrillation: Secondary | ICD-10-CM | POA: Diagnosis not present

## 2020-09-14 DIAGNOSIS — I2510 Atherosclerotic heart disease of native coronary artery without angina pectoris: Secondary | ICD-10-CM | POA: Insufficient documentation

## 2020-09-14 DIAGNOSIS — I252 Old myocardial infarction: Secondary | ICD-10-CM | POA: Insufficient documentation

## 2020-09-14 DIAGNOSIS — Z95810 Presence of automatic (implantable) cardiac defibrillator: Secondary | ICD-10-CM | POA: Diagnosis not present

## 2020-09-14 DIAGNOSIS — I480 Paroxysmal atrial fibrillation: Secondary | ICD-10-CM | POA: Insufficient documentation

## 2020-09-14 DIAGNOSIS — Z955 Presence of coronary angioplasty implant and graft: Secondary | ICD-10-CM | POA: Insufficient documentation

## 2020-09-14 DIAGNOSIS — D6869 Other thrombophilia: Secondary | ICD-10-CM | POA: Diagnosis not present

## 2020-09-14 DIAGNOSIS — R42 Dizziness and giddiness: Secondary | ICD-10-CM | POA: Diagnosis not present

## 2020-09-14 DIAGNOSIS — R9431 Abnormal electrocardiogram [ECG] [EKG]: Secondary | ICD-10-CM | POA: Diagnosis not present

## 2020-09-14 DIAGNOSIS — F17210 Nicotine dependence, cigarettes, uncomplicated: Secondary | ICD-10-CM | POA: Diagnosis not present

## 2020-09-14 DIAGNOSIS — Z7982 Long term (current) use of aspirin: Secondary | ICD-10-CM | POA: Diagnosis not present

## 2020-09-14 DIAGNOSIS — I255 Ischemic cardiomyopathy: Secondary | ICD-10-CM | POA: Diagnosis not present

## 2020-09-14 DIAGNOSIS — Z7901 Long term (current) use of anticoagulants: Secondary | ICD-10-CM | POA: Diagnosis not present

## 2020-09-14 DIAGNOSIS — Z79899 Other long term (current) drug therapy: Secondary | ICD-10-CM | POA: Insufficient documentation

## 2020-09-14 NOTE — Progress Notes (Addendum)
Primary Care Physician: Vivien Presto, MD Referring Physician: Device clinic Cardiologists: Dr. Margo Aye. Croitoru    Devin Ellis is a 59 y.o. male with a h/o CAD, s/p remote MI with coronary stents, ischemic cardiomyopathy, ICD, tobacco abuse that is in the afib clinic for new onset afib with Carlink alert being received 08/09/20 with v rates of 100- 140 bpm. No DOAC on board but is taking asa and plavix for stents placed in 2016, no bleeding issues with these drugs. [name] in SR today.  He was not aware of the arrhythmia. continues to smoke a small amount, no excessive caffeine. No alcohol use. The wife denies he has snoring/apnea.   F/u in afib clinic, 11/16. [name] not aware of any irregular heart beat. He continues on eliquis 5 mg bid, he is c/o of some unsteadiness when first standing, passes quickly,  BP checked on standing and was not orthostatic. No carotid bruits heard. [name] working to  stop smoking. CBC/bmet done in a few days ago and stable, found in care everywhere. No bleeding issues with eliquis.   Today, he denies symptoms of palpitations, chest pain, shortness of breath, orthopnea, PND, lower extremity edema, dizziness, presyncope, syncope, or neurologic sequela. The patient is tolerating medications without difficulties and is otherwise without complaint today.   Past Medical History:  Diagnosis Date  . CAD in native artery, residual 60 LCX 06/14/2015  . Cardiomyopathy, ischemic, EF by Echo 40-45% 06/14/2015  . Hyperlipidemia LDL goal <70 06/14/2015  . Pericardial effusion 06/26/2015  . Pericardial tamponade 06/26/2015  . Rheumatoid arthritis involving both hands (HCC) 04/2016  . S/P angioplasty with stent, to LAD with Xience DES 06/12/15  06/14/2015  . ST elevation (STEMI) myocardial infarction involving left anterior descending coronary artery (HCC) 06/12/2015  . Tobacco abuse    Past Surgical History:  Procedure Laterality Date  . CARDIAC CATHETERIZATION N/A 06/12/2015    Procedure: Left Heart Cath and Coronary Angiography;  Surgeon: Runell Gess, MD;  Location: [facility] INVASIVE CV LAB;  Service: Cardiovascular;  Laterality: N/A;  . CARDIAC CATHETERIZATION N/A 06/12/2015   Procedure: Coronary Stent Intervention;  Surgeon: Runell Gess, MD;  Location: [facility] INVASIVE CV LAB;  Service: Cardiovascular;  Laterality: N/A;  . ICD IMPLANT N/A 01/10/2017   Procedure: ICD Implant;  Surgeon: Thurmon Fair, MD;  Location: [facility] INVASIVE CV LAB;  Service: Cardiovascular;  Laterality: N/A;  . SHOULDER SURGERY Right    Torn bicep  . SUBXYPHOID PERICARDIAL WINDOW N/A 06/26/2015   Procedure: SUBXYPHOID PERICARDIAL WINDOW;  Surgeon: Purcell Nails, MD;  Location: [facility] OR;  Service: Thoracic;  Laterality: N/A;    Current Outpatient Medications  Medication Sig Dispense Refill  . acetaminophen (TYLENOL) 325 MG tablet Take 2 tablets (650 mg total) by mouth every 4 (four) hours as needed for headache or mild pain.    [name] apixaban (ELIQUIS) 5 MG TABS tablet Take 1 tablet (5 mg total) by mouth 2 (two) times daily. 60 tablet 3  . aspirin EC 81 MG tablet Take 81 mg by mouth daily.    [name] atorvastatin (LIPITOR) 10 MG tablet TAKE 1 TABLET BY MOUTH EVERY DAY 90 tablet 3  . carvedilol (COREG) 12.5 MG tablet TAKE 1 TABLET BY MOUTH TWICE A DAY 180 tablet 3  . famotidine (PEPCID) 40 MG tablet Take 40 mg by mouth daily.    . fenofibrate 160 MG tablet TAKE 1 TABLET BY MOUTH EVERY DAY 90 tablet 3  . folic acid (FOLVITE)  1 MG tablet Take 1 mg by mouth daily.  3  . HYDROcodone-acetaminophen (NORCO) 7.5-325 MG tablet Take 1 tablet by mouth every 6 (six) hours as needed.    [name] lisinopril (ZESTRIL) 2.5 MG tablet TAKE 1 TABLET BY MOUTH EVERY DAY 90 tablet 3  . methotrexate (RHEUMATREX) 2.5 MG tablet 15mg  by mouth at night on saturdays    . nitroGLYCERIN (NITROSTAT) 0.4 MG SL tablet Place 1 tablet (0.4 mg total) under the tongue every 5 (five) minutes as needed for chest pain. 25 tablet 4   No current  facility-administered medications for this encounter.    No Known Allergies  Social History   Socioeconomic History  . Marital status: Married    Spouse name: Not on file  . Number of children: 5  . Years of education: Not on file  . Highest education level: Not on file  Occupational History  . 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 Smoking status: Current Every Day Smoker    Packs/day: 0.25    Years: 30.00    Pack years: 7.50    Types: Cigarettes  . Smokeless tobacco: Never Used  Substance and Sexual Activity  . Alcohol use: Not Currently    Alcohol/week: 0.0 standard drinks  . Drug use: Never  . Sexual activity: Not on file  Other Topics Concern  . Not on file  Social History Narrative   Married.  Five children total between him and his wife.  He works for [employer] in [employer].     Social Determinants of Health   Financial Resource Strain:   . Difficulty of Paying Living Expenses: Not on file  Food Insecurity:   . Worried About [employer] in the Last Year: Not on file  . Ran Out of Food in the Last Year: Not on file  Transportation Needs:   . Lack of Transportation (Medical): Not on file  . Lack of Transportation (Non-Medical): Not on file  Physical Activity:   . Days of Exercise per Week: Not on file  . Minutes of Exercise per Session: Not on file  Stress:   . Feeling of Stress : Not on file  Social Connections:   . Frequency of Communication with Friends and Family: Not on file  . Frequency of Social Gatherings with Friends and Family: Not on file  . Attends Religious Services: Not on file  . Active Member of Clubs or Organizations: Not on file  . Attends Programme researcher, broadcasting/film/video Meetings: Not on file  . Marital Status: Not on file  Intimate Partner Violence:   . Fear of Current or Ex-Partner: Not on file  . Emotionally Abused: Not on file  . Physically Abused: Not on file  . Sexually Abused: Not on file     Family History  Problem Relation Age of Onset  . Hypertension Mother   . Diabetes Mother   . Diabetes Father   . Heart attack Neg Hx   . Stroke Neg Hx     ROS- All systems are reviewed and negative except as per the HPI above  Physical Exam: Vitals:   09/14/20 0857  BP: 116/80  Pulse: 77  Weight: 78.5 kg  Height: 6\' 1"  (1.854 m)   Wt Readings from Last 3 Encounters:  09/14/20 78.5 kg  08/17/20 78.2 kg  05/31/20 78.7 kg    Labs: Lab Results  Component Value Date   NA 137 01/03/2017  K 4.8 01/03/2017   CL 101 01/03/2017   CO2 30 01/03/2017   GLUCOSE 98 01/03/2017   BUN 12 01/03/2017   CREATININE 0.81 01/03/2017   CALCIUM 9.6 01/03/2017   Lab Results  Component Value Date   INR 1.1 01/03/2017   Lab Results  Component Value Date   CHOL 142 03/04/2019   HDL 46 03/04/2019   LDLCALC 76 03/04/2019   TRIG 99 03/04/2019     GEN- The patient is well appearing, alert and oriented x 3 today.   Head- normocephalic, atraumatic Eyes-  Sclera clear, conjunctiva pink Ears- hearing intact Oropharynx- clear Neck- supple, no JVP, no carotid bruits auscultated  Lymph- no cervical lymphadenopathy Lungs- Clear to ausculation bilaterally, normal work of breathing Heart- Regular rate and rhythm, no murmurs, rubs or gallops, PMI not laterally displaced GI- soft, NT, ND, + BS Extremities- no clubbing, cyanosis, or edema MS- no significant deformity or atrophy Skin- no rash or lesion Psych- euthymic mood, full affect Neuro- strength and sensation are intact  EKG-  NSR at 77 bpm, pr int 140 ms, qrs int 96 ms, qtc 427 ms                                        Assessment and Plan: 1. New onset paroxysmal afib  Currently in SR  No awareness of afib  Continue carvedilol 12.5 mg bid  Encouraged  to stop smoking and avoid stimulants  2. CHA2DS2VASc score of 3(CAD, LV dysfunction, HF)   [name] now off plavix, continues on asa  Continue  eliquis 5 mg bid  Recent  cbc/bmet stable   3. Lightheadedness when standing I gave him a BP cuff at home to monitor Change  position slowly  Stay hydrated  If escalates, report to PCP Stop smoking  No carotid bruits heard today  No orthostatic hypotension noted today   F/u with Dr. Lonia Chimera as scheduled  If afib burden escalates then will require further rate or rhythm control   Annete Ayuso C. [facility] Folks [facility] [address] [place], [place] [address] [phone]

## 2020-11-23 ENCOUNTER — Other Ambulatory Visit: Payer: Self-pay | Admitting: Cardiovascular Disease

## 2020-11-23 DIAGNOSIS — E782 Mixed hyperlipidemia: Secondary | ICD-10-CM

## 2020-11-24 ENCOUNTER — Other Ambulatory Visit: Payer: Self-pay

## 2020-11-24 ENCOUNTER — Encounter: Payer: Self-pay | Admitting: Cardiovascular Disease

## 2020-11-24 ENCOUNTER — Ambulatory Visit: Payer: Medicare Other | Admitting: Cardiovascular Disease

## 2020-11-24 VITALS — BP 116/70 | HR 72 | Ht 73.0 in | Wt 176.6 lb

## 2020-11-24 DIAGNOSIS — I2102 ST elevation (STEMI) myocardial infarction involving left anterior descending coronary artery: Secondary | ICD-10-CM | POA: Diagnosis not present

## 2020-11-24 DIAGNOSIS — E785 Hyperlipidemia, unspecified: Secondary | ICD-10-CM | POA: Diagnosis not present

## 2020-11-24 DIAGNOSIS — I480 Paroxysmal atrial fibrillation: Secondary | ICD-10-CM | POA: Insufficient documentation

## 2020-11-24 DIAGNOSIS — F17210 Nicotine dependence, cigarettes, uncomplicated: Secondary | ICD-10-CM

## 2020-11-24 DIAGNOSIS — I255 Ischemic cardiomyopathy: Secondary | ICD-10-CM

## 2020-11-24 DIAGNOSIS — I3139 Other pericardial effusion (noninflammatory): Secondary | ICD-10-CM

## 2020-11-24 DIAGNOSIS — I313 Pericardial effusion (noninflammatory): Secondary | ICD-10-CM

## 2020-11-24 NOTE — Progress Notes (Signed)
11/24/2020 Devin Ellis   Jun 17, 1961  [id number]  Primary Physician Ellis, Devin Mody, MD Primary Cardiologist: Devin Gess MD Nicholes Devin, [place]  HPI:  Devin Ellis is a 60 y.o.  thin-appearing married Caucasian male father of 5 children who worked at [employer] in the past but has been unemployed since his heart attack.Devin Ellis  He just started seeing a new primary care physician at [facility]. I last saw him in the office1/26/2058.He had an acute anterior wall myocardial infarction on 06/12/15 treated with stenting of his mid LAD with a drug-eluting stent in the right radial approach by myself. His EF at that time was 25-35% with anteroapical wall motion and mobility. He had a 60% AV groove circumflex stenosis. His risk factor profile is notable for 80 pack years of tobacco smoking 2 packs a day up until the point now smoking 4 cigarettes a day. 2 weeks post discharge from the hospital he was admitted with pericardial tamponade due to hemorrhagic pericardial effusion undergoing a subxiphoid pericardial window by Dr. Cornelius Ellis . Since discharge she still remarkably well. He has no shortness of breath or chest pain. I released him to go back to work.A 2-D echo recently performed 11/06/16 revealed a decline in his ejection fraction to 30-35% range. Based on this, I referred him to Dr. Royann Ellis for evaluation of ICD implantation for primary prevention. This was performed successfully on 01/10/17 and he has been followed regularly since.  Since I saw him a year ago he is remained stable.  He denies chest pain or shortness of breath.  He continues to smoke 4 cigarettes a day.  He was found to have asymptomatic A. fib on CareLink interrogation followed by Dr. Royann Ellis and was begun on Eliquis followed in the A. fib clinic by Devin Coco, NP.   Current Meds  Medication Sig  . acetaminophen (TYLENOL) 325 MG tablet Take 2 tablets (650 mg total) by mouth every 4 (four) hours as needed for headache or  mild pain.  Devin Ellis apixaban (ELIQUIS) 5 MG TABS tablet Take 1 tablet (5 mg total) by mouth 2 (two) times daily.  Devin Ellis aspirin EC 81 MG tablet Take 81 mg by mouth daily.  Devin Ellis atorvastatin (LIPITOR) 10 MG tablet TAKE 1 TABLET BY MOUTH EVERY DAY  . carvedilol (COREG) 12.5 MG tablet TAKE 1 TABLET BY MOUTH TWICE A DAY  . famotidine (PEPCID) 40 MG tablet Take 40 mg by mouth daily.  . fenofibrate 160 MG tablet TAKE 1 TABLET BY MOUTH EVERY DAY  . folic acid (FOLVITE) 1 MG tablet Take 1 mg by mouth daily.  Devin Ellis HYDROcodone-acetaminophen (NORCO) 7.5-325 MG tablet Take 1 tablet by mouth every 6 (six) hours as needed.  Devin Ellis lisinopril (ZESTRIL) 2.5 MG tablet TAKE 1 TABLET BY MOUTH EVERY DAY  . methotrexate (RHEUMATREX) 2.5 MG tablet 15mg  by mouth at night on saturdays  . nitroGLYCERIN (NITROSTAT) 0.4 MG SL tablet Place 1 tablet (0.4 mg total) under the tongue every 5 (five) minutes as needed for chest pain.  . Tofacitinib Citrate ER (XELJANZ XR) 11 MG TB24 Take by mouth.  . [DISCONTINUED] fenofibrate 160 MG tablet TAKE 1 TABLET BY MOUTH EVERY DAY     No Known Allergies  Social History   Socioeconomic History  . Marital status: Married    Spouse name: Not on file  . Number of children: 5  . Years of education: Not on file  . Highest education level: Not on file  Occupational History  .  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 Smoking status: Current Every Day Smoker    Packs/day: 0.25    Years: 30.00    Pack years: 7.50    Types: Cigarettes  . Smokeless tobacco: Never Used  Substance and Sexual Activity  . Alcohol use: Not Currently    Alcohol/week: 0.0 standard drinks  . Drug use: Never  . Sexual activity: Not on file  Other Topics Concern  . Not on file  Social History Narrative   Married.  Five children total between him and his wife.  He works for [employer] in [employer].     Social Determinants of Health   Financial Resource Strain: Not on file  Food  Insecurity: Not on file  Transportation Needs: Not on file  Physical Activity: Not on file  Stress: Not on file  Social Connections: Not on file  Intimate Partner Violence: Not on file     Review of Systems: General: negative for chills, fever, night sweats or weight changes.  Cardiovascular: negative for chest pain, dyspnea on exertion, edema, orthopnea, palpitations, paroxysmal nocturnal dyspnea or shortness of breath Dermatological: negative for rash Respiratory: negative for cough or wheezing Urologic: negative for hematuria Abdominal: negative for nausea, vomiting, diarrhea, bright red blood per rectum, melena, or hematemesis Neurologic: negative for visual changes, syncope, or dizziness All other systems reviewed and are otherwise negative except as noted above.    Blood pressure 116/70, pulse 72, height 6\' 1"  (1.854 m), weight 176 lb 9.6 oz (80.1 kg).  General appearance: alert and no distress Neck: no adenopathy, no carotid bruit, no JVD, supple, symmetrical, trachea midline and thyroid not enlarged, symmetric, no tenderness/mass/nodules Lungs: clear to auscultation bilaterally Heart: regular rate and rhythm, S1, S2 normal, no murmur, click, rub or gallop Extremities: extremities normal, atraumatic, no cyanosis or edema Pulses: 2+ and symmetric Skin: Skin color, texture, turgor normal. No rashes or lesions Neurologic: Alert and oriented X 3, normal strength and tone. Normal symmetric reflexes. Normal coordination and gait  EKG not performed today  ASSESSMENT AND PLAN:   ST elevation (STEMI) myocardial infarction involving left anterior descending coronary artery History of anterior STEMI 06/12/2015 treated with PCI drug-eluting stenting of his mid LAD via the right radial approach by myself.  He did have a 60% AV groove circumflex stenosis and moderately severe LV dysfunction with an EF of 25 to 30%.  His Plavix has been discontinued.  He denies chest pain or shortness of  breath.  Hyperlipidemia LDL goal <70 History of hyperlipidemia on statin and fenofibrate.  We will recheck a lipid liver profile this morning.  Cigarette nicotine dependence without complication Ongoing tobacco abuse of 4 cigarettes a day motivated to stop  Ischemic cardiomyopathy History of ischemic cardiomyopathy with an EF that remains in the 30 to 35% range by 2D echo most recently performed 12/10/2018 and appropriate pharmacology.  He denies symptoms of heart failure.  He did have an ICD implanted by Dr. 02/08/2019 for primary prevention which he follows.  Pericardial effusion History of hemorrhagic pericardial tamponade status post subxiphoid pericardial window by Dr. Royann Ellis in the past.      Devin Moras MD [facility], [facility] 11/24/2020 10:06 AM

## 2020-11-24 NOTE — Assessment & Plan Note (Signed)
History of hemorrhagic pericardial tamponade status post subxiphoid pericardial window by Dr. Cornelius Moras in the past.

## 2020-11-24 NOTE — Assessment & Plan Note (Signed)
Patient was found to have PAF on CareLink interrogation of his ICD.  He was asymptomatic and unaware of this.  He was started on Eliquis oral anticoagulation (This patients CHA2DS2-VASc Score and unadjusted Ischemic Stroke Rate (% per year) is equal to 3.2 % stroke rate/year from a score of 3  Above score calculated as 1 point each if present [CHF, HTN, DM, Vascular=MI/PAD/Aortic Plaque, Age if 65-74, or Male] Above score calculated as 2 points each if present [Age > 75, or Stroke/TIA/TE]

## 2020-11-24 NOTE — Assessment & Plan Note (Signed)
History of anterior STEMI 06/12/2015 treated with PCI drug-eluting stenting of his mid LAD via the right radial approach by myself.  He did have a 60% AV groove circumflex stenosis and moderately severe LV dysfunction with an EF of 25 to 30%.  His Plavix has been discontinued.  He denies chest pain or shortness of breath.

## 2020-11-24 NOTE — Patient Instructions (Signed)

## 2020-11-24 NOTE — Assessment & Plan Note (Signed)
History of hyperlipidemia on statin and fenofibrate.  We will recheck a lipid liver profile this morning.

## 2020-11-24 NOTE — Assessment & Plan Note (Signed)
History of ischemic cardiomyopathy with an EF that remains in the 30 to 35% range by 2D echo most recently performed 12/10/2018 and appropriate pharmacology.  He denies symptoms of heart failure.  He did have an ICD implanted by Dr. Royann Shivers for primary prevention which he follows.

## 2020-11-24 NOTE — Assessment & Plan Note (Signed)
Ongoing tobacco abuse of 4 cigarettes a day motivated to stop

## 2020-11-25 LAB — HEPATIC FUNCTION PANEL
ALT: 21 IU/L (ref 0–44)
AST: 13 IU/L (ref 0–40)
Albumin: 4.5 g/dL (ref 3.8–4.9)
Alkaline Phosphatase: 66 IU/L (ref 44–121)
Bilirubin Total: 0.2 mg/dL (ref 0.0–1.2)
Bilirubin, Direct: <0.1 mg/dL (ref 0.00–0.40)
Total Protein: 6.8 g/dL (ref 6.0–8.5)

## 2020-11-25 LAB — LIPID PANEL
Chol/HDL Ratio: 3 ratio (ref 0.0–5.0)
Cholesterol, Total: 121 mg/dL (ref 100–199)
HDL: 40 mg/dL (ref >39)
LDL Chol Calc (NIH): 56 mg/dL (ref 0–99)
Triglycerides: 146 mg/dL (ref 0–149)
VLDL Cholesterol Cal: 25 mg/dL (ref 5–40)

## 2020-12-10 ENCOUNTER — Other Ambulatory Visit (HOSPITAL_COMMUNITY): Payer: Self-pay | Admitting: Nurse Practitioner

## 2020-12-10 NOTE — Telephone Encounter (Signed)
Prescription refill request for Eliquis received. Indication: atrial fibrillation Last office visit:1/22 berry Scr: needs labs Age: 60 Weight:80.1 kg  Prescription refilled

## 2020-12-25 ENCOUNTER — Other Ambulatory Visit: Payer: Self-pay | Admitting: Cardiovascular Disease

## 2020-12-27 ENCOUNTER — Ambulatory Visit (INDEPENDENT_AMBULATORY_CARE_PROVIDER_SITE_OTHER): Payer: Medicare Other

## 2020-12-27 DIAGNOSIS — I255 Ischemic cardiomyopathy: Secondary | ICD-10-CM | POA: Diagnosis not present

## 2020-12-27 LAB — CUP PACEART REMOTE DEVICE CHECK
Battery Remaining Longevity: 94 mo
Battery Voltage: 3 V
Brady Statistic RV Percent Paced: 0.01 %
Date Time Interrogation Session: 20220228022824
HighPow Impedance: 75 Ohm
Implantable Lead Implant Date: 20180314
Implantable Lead Location: 753860
Implantable Pulse Generator Implant Date: 20180314
Lead Channel Impedance Value: 494 Ohm
Lead Channel Impedance Value: 570 Ohm
Lead Channel Pacing Threshold Amplitude: 0.5 V
Lead Channel Pacing Threshold Pulse Width: 0.4 ms
Lead Channel Sensing Intrinsic Amplitude: 12.5 mV
Lead Channel Sensing Intrinsic Amplitude: 12.5 mV
Lead Channel Setting Pacing Amplitude: 2.5 V
Lead Channel Setting Pacing Pulse Width: 0.4 ms
Lead Channel Setting Sensing Sensitivity: 0.3 mV

## 2020-12-29 MED ORDER — CARVEDILOL 12.5 MG PO TABS: 12.5000 mg | ORAL_TABLET | Freq: Two times a day (BID) | ORAL | 3 refills | Status: DC

## 2020-12-29 MED ORDER — LISINOPRIL 2.5 MG PO TABS: 2.5000 mg | ORAL_TABLET | Freq: Every day | ORAL | 3 refills | Status: DC

## 2021-01-04 NOTE — Progress Notes (Signed)
Remote ICD transmission.   

## 2021-01-05 ENCOUNTER — Other Ambulatory Visit (HOSPITAL_COMMUNITY): Payer: Self-pay | Admitting: Nurse Practitioner

## 2021-03-29 ENCOUNTER — Ambulatory Visit (INDEPENDENT_AMBULATORY_CARE_PROVIDER_SITE_OTHER): Payer: Medicare Other

## 2021-03-29 DIAGNOSIS — I255 Ischemic cardiomyopathy: Secondary | ICD-10-CM | POA: Diagnosis not present

## 2021-03-30 ENCOUNTER — Telehealth: Payer: Self-pay | Admitting: Emergency Medicine

## 2021-03-30 LAB — CUP PACEART REMOTE DEVICE CHECK
Battery Remaining Longevity: 94 mo
Battery Voltage: 2.99 V
Brady Statistic RV Percent Paced: 0.01 %
Date Time Interrogation Session: 20220531022703
HighPow Impedance: 70 Ohm
Implantable Lead Implant Date: 20180314
Implantable Lead Location: 753860
Implantable Pulse Generator Implant Date: 20180314
Lead Channel Impedance Value: 494 Ohm
Lead Channel Impedance Value: 570 Ohm
Lead Channel Pacing Threshold Amplitude: 0.5 V
Lead Channel Pacing Threshold Pulse Width: 0.4 ms
Lead Channel Sensing Intrinsic Amplitude: 17 mV
Lead Channel Sensing Intrinsic Amplitude: 17 mV
Lead Channel Setting Pacing Amplitude: 2.5 V
Lead Channel Setting Pacing Pulse Width: 0.4 ms
Lead Channel Setting Sensing Sensitivity: 0.3 mV

## 2021-03-30 NOTE — Telephone Encounter (Signed)
LMOM to call device clinic. Scheduled remote with 2 NSVT episodes. Last episodes 02/28/2021 @ 0433,  lasted  29 beats.

## 2021-03-30 NOTE — Telephone Encounter (Signed)
Patient and wife report he was asymptomatic at time of event of NSVT 02/28/21. Will continue to monitor and patient will notify the office of any change in condition. Ed precautions given for CP. Chest pressure, SOB , or syncope.

## 2021-03-30 NOTE — Telephone Encounter (Signed)
He just had labs performed on May 25 (see care everywhere) with normal electrolytes. That's a pretty long episode of nonsustained VT, but if it was asymptomatic I would probably not recommend any change in treatment.

## 2021-04-20 NOTE — Progress Notes (Signed)
Remote ICD transmission.   

## 2021-05-23 LAB — COLOGUARD: COLOGUARD: NEGATIVE

## 2021-06-23 ENCOUNTER — Other Ambulatory Visit: Payer: Self-pay | Admitting: Cardiovascular Disease

## 2021-06-23 NOTE — Telephone Encounter (Signed)
Rx(s) sent to pharmacy electronically.  

## 2021-06-27 ENCOUNTER — Ambulatory Visit (INDEPENDENT_AMBULATORY_CARE_PROVIDER_SITE_OTHER): Payer: Medicare Other

## 2021-06-27 DIAGNOSIS — I255 Ischemic cardiomyopathy: Secondary | ICD-10-CM

## 2021-06-27 LAB — CUP PACEART REMOTE DEVICE CHECK
Battery Remaining Longevity: 89 mo
Battery Voltage: 2.99 V
Brady Statistic RV Percent Paced: 0.01 %
Date Time Interrogation Session: 20220829033525
HighPow Impedance: 66 Ohm
Implantable Lead Implant Date: 20180314
Implantable Lead Location: 753860
Implantable Pulse Generator Implant Date: 20180314
Lead Channel Impedance Value: 399 Ohm
Lead Channel Impedance Value: 494 Ohm
Lead Channel Pacing Threshold Amplitude: 0.625 V
Lead Channel Pacing Threshold Pulse Width: 0.4 ms
Lead Channel Sensing Intrinsic Amplitude: 16.625 mV
Lead Channel Sensing Intrinsic Amplitude: 16.625 mV
Lead Channel Setting Pacing Amplitude: 2.5 V
Lead Channel Setting Pacing Pulse Width: 0.4 ms
Lead Channel Setting Sensing Sensitivity: 0.3 mV

## 2021-07-08 NOTE — Progress Notes (Signed)
Remote ICD transmission.   

## 2021-07-11 ENCOUNTER — Ambulatory Visit (INDEPENDENT_AMBULATORY_CARE_PROVIDER_SITE_OTHER): Payer: Medicare Other | Admitting: Cardiovascular Disease

## 2021-07-11 ENCOUNTER — Encounter: Payer: Self-pay | Admitting: Cardiovascular Disease

## 2021-07-11 ENCOUNTER — Other Ambulatory Visit: Payer: Self-pay

## 2021-07-11 VITALS — BP 112/68 | HR 63 | Ht 73.0 in | Wt 179.8 lb

## 2021-07-11 DIAGNOSIS — I5042 Chronic combined systolic (congestive) and diastolic (congestive) heart failure: Secondary | ICD-10-CM | POA: Diagnosis not present

## 2021-07-11 DIAGNOSIS — I2510 Atherosclerotic heart disease of native coronary artery without angina pectoris: Secondary | ICD-10-CM | POA: Diagnosis not present

## 2021-07-11 DIAGNOSIS — E785 Hyperlipidemia, unspecified: Secondary | ICD-10-CM

## 2021-07-11 DIAGNOSIS — I480 Paroxysmal atrial fibrillation: Secondary | ICD-10-CM | POA: Diagnosis not present

## 2021-07-11 DIAGNOSIS — Z95810 Presence of automatic (implantable) cardiac defibrillator: Secondary | ICD-10-CM | POA: Diagnosis not present

## 2021-07-11 NOTE — Progress Notes (Signed)
Cardiology Office Note    Date:  07/11/2021   ID:  Devin Ellis, DOB 1961-07-16, MRN [id number]  PCP:  Vivien Presto, MD  Cardiologist:  Nanetta Batty, MD; Thurmon Fair, MD   Chief Complaint  Patient presents with   ICD follow-up   Atrial Fibrillation    History of Present Illness:  Devin Ellis is a 60 y.o. male with ischemic CMP (anterior MI 2016, EF 30-35% - last echo 11/06/2016, CHF NYHA class II) and returns in follow-up for his defibrillator.  He has been doing remote downloads faithfully.    In October 2021, his device detected a lengthy episode of atrial fibrillation lasting about 3 hours, with occasional periods of RVR.  He was started on Eliquis anticoagulation.  He has not had recurrence of that arrhythmia since.  He has infrequent episodes of nonsustained VT every other month or so.  The patient specifically denies any chest pain at rest exertion, dyspnea at rest or with exertion, orthopnea, paroxysmal nocturnal dyspnea, syncope, palpitations, focal neurological deficits, intermittent claudication, lower extremity edema, unexplained weight gain, cough, hemoptysis or wheezing.  He has not had falls, injuries or serious bleeding since on anticoagulants.  But he is limited by stiffness in his finger joints and neuropathy in both feet.  Nevertheless, ICD shows roughly 4.3 hours/day of movement.    Normal ICD function.  His Medtronic Visia device was implanted in 2018 and has almost 7.3 more years of estimated longevity.  He has rare nonsustained VT, most recently in August of this year.  He has never received device therapy.  He does not require pacing.  OptiVol is normal.  He has a history of rheumatoid arthritis, lower extremity neuropathy and had hemopericardium regarding surgical window 2 weeks after his infarction in 2016.  Past Medical History:  Diagnosis Date   CAD in native artery, residual 60 LCX 06/14/2015   Cardiomyopathy, ischemic, EF by Echo 40-45% 06/14/2015    Hyperlipidemia LDL goal <70 06/14/2015   Pericardial effusion 06/26/2015   Pericardial tamponade 06/26/2015   Rheumatoid arthritis involving both hands (HCC) 04/2016   S/P angioplasty with stent, to LAD with Xience DES 06/12/15  06/14/2015   ST elevation (STEMI) myocardial infarction involving left anterior descending coronary artery (HCC) 06/12/2015   Tobacco abuse     Past Surgical History:  Procedure Laterality Date   CARDIAC CATHETERIZATION N/A 06/12/2015   Procedure: Left Heart Cath and Coronary Angiography;  Surgeon: Runell Gess, MD;  Location: [facility] INVASIVE CV LAB;  Service: Cardiovascular;  Laterality: N/A;   CARDIAC CATHETERIZATION N/A 06/12/2015   Procedure: Coronary Stent Intervention;  Surgeon: Runell Gess, MD;  Location: [facility] INVASIVE CV LAB;  Service: Cardiovascular;  Laterality: N/A;   ICD IMPLANT N/A 01/10/2017   Procedure: ICD Implant;  Surgeon: Thurmon Fair, MD;  Location: [facility] INVASIVE CV LAB;  Service: Cardiovascular;  Laterality: N/A;   SHOULDER SURGERY Right    Torn bicep   SUBXYPHOID PERICARDIAL WINDOW N/A 06/26/2015   Procedure: SUBXYPHOID PERICARDIAL WINDOW;  Surgeon: Purcell Nails, MD;  Location: [facility] OR;  Service: Thoracic;  Laterality: N/A;    Current Medications: Outpatient Medications Prior to Visit  Medication Sig Dispense Refill   acetaminophen (TYLENOL) 325 MG tablet Take 2 tablets (650 mg total) by mouth every 4 (four) hours as needed for headache or mild pain.     aspirin EC 81 MG tablet Take 81 mg by mouth daily.     atorvastatin (LIPITOR) 10 MG tablet TAKE 1  TABLET BY MOUTH EVERY DAY 90 tablet 0   carvedilol (COREG) 12.5 MG tablet Take 1 tablet (12.5 mg total) by mouth 2 (two) times daily. 180 tablet 3   ELIQUIS 5 MG TABS tablet TAKE 1 TABLET BY MOUTH TWICE A DAY 60 tablet 6   famotidine (PEPCID) 40 MG tablet Take 40 mg by mouth daily.     fenofibrate 160 MG tablet TAKE 1 TABLET BY MOUTH EVERY DAY 90 tablet 3   folic acid (FOLVITE) 1 MG tablet Take  1 mg by mouth daily.  3   gabapentin (NEURONTIN) 300 MG capsule Take 300 mg by mouth in the morning, at noon, and at bedtime.     HYDROcodone-acetaminophen (NORCO) 7.5-325 MG tablet Take 1 tablet by mouth every 6 (six) hours as needed.     lisinopril (ZESTRIL) 2.5 MG tablet Take 1 tablet (2.5 mg total) by mouth daily. 90 tablet 3   methotrexate (RHEUMATREX) 2.5 MG tablet 15mg  by mouth at night on saturdays     omeprazole (PRILOSEC) 10 MG capsule Take 10 mg by mouth daily.     Tofacitinib Citrate ER (XELJANZ XR) 11 MG TB24 Take by mouth.     traZODone (DESYREL) 100 MG tablet Take 100 mg by mouth at bedtime.     nitroGLYCERIN (NITROSTAT) 0.4 MG SL tablet Place 1 tablet (0.4 mg total) under the tongue every 5 (five) minutes as needed for chest pain. 25 tablet 4   No facility-administered medications prior to visit.     Allergies:   Patient has no known allergies.   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  Tobacco Use   Smoking status: Every Day    Packs/day: 0.25    Years: 30.00    Pack years: 7.50    Types: Cigarettes   Smokeless tobacco: Never  Substance and Sexual Activity   Alcohol use: Not Currently    Alcohol/week: 0.0 standard drinks   Drug use: Never   Sexual activity: Not on file  Other Topics Concern   Not on file  Social History Narrative   Married.  Five children total between him and his wife.  He works for [employer] in [employer].     Social Determinants of Health   Financial Resource Strain: Not on file  Food Insecurity: Not on file  Transportation Needs: Not on file  Physical Activity: Not on file  Stress: Not on file  Social Connections: Not on file     Family History:  The patient's family history includes  Diabetes in his father and mother; Hypertension in his mother.   ROS:   Please see the history of present illness.    ROS All other systems are reviewed and are negative.  PHYSICAL EXAM:   VS:  BP 112/68 (BP Location: Left Arm, Patient Position: Sitting, Cuff Size: Normal)   Pulse 63   Ht 6\' 1"  (1.854 m)   Wt 179 lb 12.8 oz (81.6 kg)   SpO2 96%   BMI 23.72 kg/m     General: Alert, oriented x3, no distress, healthy left subclavian ICD site Head: no evidence of trauma, PERRL, EOMI, no exophtalmos or lid lag, no myxedema, no xanthelasma; normal ears, nose and  oropharynx Neck: normal jugular venous pulsations and no hepatojugular reflux; brisk carotid pulses without delay and no carotid bruits Chest: clear to auscultation, no signs of consolidation by percussion or palpation, normal fremitus, symmetrical and full respiratory excursions Cardiovascular: normal position and quality of the apical impulse, regular rhythm, normal first and second heart sounds, no murmurs, rubs or gallops Abdomen: no tenderness or distention, no masses by palpation, no abnormal pulsatility or arterial bruits, normal bowel sounds, no hepatosplenomegaly Extremities: no clubbing, cyanosis or edema; 2+ radial, ulnar and brachial pulses bilaterally; 2+ right femoral, posterior tibial and dorsalis pedis pulses; 2+ left femoral, posterior tibial and dorsalis pedis pulses; no subclavian or femoral bruits Neurological: grossly nonfocal Psych: Normal mood and affect   Wt Readings from Last 3 Encounters:  07/11/21 179 lb 12.8 oz (81.6 kg)  11/24/20 176 lb 9.6 oz (80.1 kg)  09/14/20 173 lb (78.5 kg)      Studies/Labs Reviewed:   EKG:  EKG is  ordered today.  Unchanged from previous tracing, shows normal sinus rhythm, Q waves in leads I, aVL, V1, V2, no acute repolarization abnormalities, QTC 409 ms  Recent Labs: 11/24/2020: ALT 21   Lipid Panel    Component Value Date/Time   CHOL 121 11/24/2020 1038   TRIG 146  11/24/2020 1038   HDL 40 11/24/2020 1038   CHOLHDL 3.0 11/24/2020 1038   CHOLHDL 3.0 06/14/2015 0406   VLDL 24 06/14/2015 0406   LDLCALC 56 11/24/2020 1038     ASSESSMENT:    1. Paroxysmal atrial fibrillation (HCC)   2. Chronic combined systolic and diastolic CHF (congestive heart failure) (HCC)   3. ICD (implantable cardioverter-defibrillator) in place   4. Coronary artery disease involving native coronary artery of native heart without angina pectoris   5. Hyperlipidemia LDL goal <70      PLAN:  In order of problems listed above:  Afib: Relatively recent diagnosis.  Asymptomatic 3-hour event last fall without recurrence.  On anticoagulation.  CHA2DS2-VASc 2 (CAD, CHF), no history of TIA/stroke or embolic events. CHF: Euvolemic (clinically and by OptiVol) without loop diuretics, NYHA functional class I. On relatively low-dose of ACE inhibitor due to blood pressure limitations.  On a moderate dose of carvedilol. ICD: Normal device function.  Continue remote downloads every 3 months. CAD: Asymptomatic, on aspirin and statin.  Still struggling to quit smoking altogether, still smoking about 3-4 cigarettes a day. HLP: Most recent LDL cholesterol was excellent at 56.  Borderline HDL at 40.   Medication Adjustments/Labs and Tests Ordered: Current medicines are reviewed at length with the patient today.  Concerns regarding medicines are outlined above.  Medication changes, Labs and Tests ordered today are listed in the Patient Instructions below. Patient Instructions  Medication Instructions:  No changes *If you need a refill on your cardiac medications before your next appointment, please call your pharmacy*   Lab Work: None ordered If you have labs (blood work) drawn today and your tests are completely normal, you will receive your results only by: MyChart Message (if you have MyChart) OR A paper copy in the mail If you have any lab test that is abnormal or we need to change  your treatment, we will call you to review the results.   Testing/Procedures: None ordered   Follow-Up: At [facility], you and your health needs are our priority.  As part of our continuing mission to provide you with exceptional heart care, we have created designated Provider Care Teams.  These Care Teams  include your primary Cardiologist (physician) and Advanced Practice Providers (APPs -  Physician Assistants and Nurse Practitioners) who all work together to provide you with the care you need, when you need it.  We recommend signing up for the patient portal called "MyChart".  Sign up information is provided on this After Visit Summary.  MyChart is used to connect with patients for Virtual Visits (Telemedicine).  Patients are able to view lab/test results, encounter notes, upcoming appointments, etc.  Non-urgent messages can be sent to your provider as well.   To learn more about what you can do with MyChart, go to [email or web address].    Your next appointment:   12 month(s)  The format for your next appointment:   In Person  Provider:   Thurmon Fair, MD    Signed, Thurmon Fair, MD  07/11/2021 4:01 PM    [facility] [address] [place], [place], [place]  [address] Phone: [phone]; Fax: [phone]

## 2021-07-11 NOTE — Patient Instructions (Signed)

## 2021-07-19 ENCOUNTER — Other Ambulatory Visit: Payer: Self-pay | Admitting: Cardiovascular Disease

## 2021-08-08 ENCOUNTER — Other Ambulatory Visit (HOSPITAL_COMMUNITY): Payer: Self-pay | Admitting: Nurse Practitioner

## 2021-09-14 ENCOUNTER — Other Ambulatory Visit: Payer: Self-pay | Admitting: Cardiovascular Disease

## 2021-09-26 ENCOUNTER — Ambulatory Visit (INDEPENDENT_AMBULATORY_CARE_PROVIDER_SITE_OTHER): Payer: Medicare Other

## 2021-09-26 DIAGNOSIS — I255 Ischemic cardiomyopathy: Secondary | ICD-10-CM

## 2021-09-26 LAB — CUP PACEART REMOTE DEVICE CHECK
Battery Remaining Longevity: 82 mo
Battery Voltage: 2.99 V
Brady Statistic RV Percent Paced: 0.01 %
Date Time Interrogation Session: 20221128043725
HighPow Impedance: 80 Ohm
Implantable Lead Implant Date: 20180314
Implantable Lead Location: 753860
Implantable Pulse Generator Implant Date: 20180314
Lead Channel Impedance Value: 494 Ohm
Lead Channel Impedance Value: 570 Ohm
Lead Channel Pacing Threshold Amplitude: 0.5 V
Lead Channel Pacing Threshold Pulse Width: 0.4 ms
Lead Channel Sensing Intrinsic Amplitude: 16.75 mV
Lead Channel Sensing Intrinsic Amplitude: 16.75 mV
Lead Channel Setting Pacing Amplitude: 2.5 V
Lead Channel Setting Pacing Pulse Width: 0.4 ms
Lead Channel Setting Sensing Sensitivity: 0.3 mV

## 2021-10-04 NOTE — Progress Notes (Signed)
Remote ICD transmission.   

## 2021-11-04 ENCOUNTER — Other Ambulatory Visit: Payer: Self-pay | Admitting: Cardiovascular Disease

## 2021-11-04 DIAGNOSIS — E782 Mixed hyperlipidemia: Secondary | ICD-10-CM

## 2021-11-22 ENCOUNTER — Ambulatory Visit: Payer: Medicare Other | Admitting: Cardiovascular Disease

## 2021-12-10 ENCOUNTER — Other Ambulatory Visit: Payer: Self-pay | Admitting: Cardiovascular Disease

## 2021-12-23 ENCOUNTER — Encounter: Payer: Self-pay | Admitting: Cardiovascular Disease

## 2021-12-26 ENCOUNTER — Ambulatory Visit (INDEPENDENT_AMBULATORY_CARE_PROVIDER_SITE_OTHER): Payer: Medicare HMO

## 2021-12-26 DIAGNOSIS — I255 Ischemic cardiomyopathy: Secondary | ICD-10-CM | POA: Diagnosis not present

## 2021-12-26 LAB — CUP PACEART REMOTE DEVICE CHECK
Battery Remaining Longevity: 81 mo
Battery Voltage: 2.99 V
Brady Statistic RV Percent Paced: 0 %
Date Time Interrogation Session: 20230227042503
HighPow Impedance: 83 Ohm
Implantable Lead Implant Date: 20180314
Implantable Lead Location: 753860
Implantable Pulse Generator Implant Date: 20180314
Lead Channel Impedance Value: 494 Ohm
Lead Channel Impedance Value: 589 Ohm
Lead Channel Pacing Threshold Amplitude: 0.625 V
Lead Channel Pacing Threshold Pulse Width: 0.4 ms
Lead Channel Sensing Intrinsic Amplitude: 15.75 mV
Lead Channel Sensing Intrinsic Amplitude: 15.75 mV
Lead Channel Setting Pacing Amplitude: 2.5 V
Lead Channel Setting Pacing Pulse Width: 0.4 ms
Lead Channel Setting Sensing Sensitivity: 0.3 mV

## 2021-12-30 NOTE — Progress Notes (Signed)
Remote ICD transmission.   

## 2022-01-03 ENCOUNTER — Other Ambulatory Visit: Payer: Self-pay | Admitting: Cardiovascular Disease

## 2022-01-04 MED ORDER — ATORVASTATIN CALCIUM 10 MG PO TABS: 10.0000 mg | ORAL_TABLET | Freq: Every day | ORAL | 3 refills | Status: DC

## 2022-01-06 ENCOUNTER — Encounter: Payer: Self-pay | Admitting: Cardiovascular Disease

## 2022-01-17 ENCOUNTER — Ambulatory Visit: Payer: Medicare HMO | Admitting: Cardiovascular Disease

## 2022-01-17 ENCOUNTER — Encounter: Payer: Self-pay | Admitting: Cardiovascular Disease

## 2022-01-17 ENCOUNTER — Other Ambulatory Visit: Payer: Self-pay

## 2022-01-17 VITALS — BP 100/62 | HR 70 | Ht 73.0 in | Wt 197.6 lb

## 2022-01-17 DIAGNOSIS — I2102 ST elevation (STEMI) myocardial infarction involving left anterior descending coronary artery: Secondary | ICD-10-CM | POA: Diagnosis not present

## 2022-01-17 DIAGNOSIS — I5042 Chronic combined systolic (congestive) and diastolic (congestive) heart failure: Secondary | ICD-10-CM

## 2022-01-17 DIAGNOSIS — E785 Hyperlipidemia, unspecified: Secondary | ICD-10-CM | POA: Diagnosis not present

## 2022-01-17 DIAGNOSIS — I480 Paroxysmal atrial fibrillation: Secondary | ICD-10-CM | POA: Diagnosis not present

## 2022-01-17 DIAGNOSIS — I255 Ischemic cardiomyopathy: Secondary | ICD-10-CM

## 2022-01-17 DIAGNOSIS — F17210 Nicotine dependence, cigarettes, uncomplicated: Secondary | ICD-10-CM

## 2022-01-17 DIAGNOSIS — Z95810 Presence of automatic (implantable) cardiac defibrillator: Secondary | ICD-10-CM

## 2022-01-17 NOTE — Progress Notes (Signed)
? ? ? ?01/17/2022 ?Devin Finlandonald Ellis   ?04/02/1961  ?[id number] ? ?Primary Physician Corrington, Meredith ModyKip A, MD ?Primary Cardiologist: Runell GessJonathan J Quinterious Walraven MD Nicholes CalamityFACP, FACC, FAHA, [place] ? ?HPI:  Devin Ellis is a 61 y.o.  thin-appearing married Caucasian male father of 5 children who worked at [employer] in the past but has been unemployed since his heart attack.[name].  He just started seeing a [facility].  I last saw him in the office 11/24/2020.  [name] accompanied by his wife [name] today.  He had an acute anterior wall myocardial infarction on 06/12/15 treated with stenting of his mid LAD with a drug-eluting stent in the right radial approach by myself. His EF at that time was 25-35% with anteroapical wall motion and mobility. He had a 60% AV groove circumflex stenosis. His risk factor profile is notable for 80 pack years of tobacco smoking 2 packs a day up until the point now smoking 4 cigarettes a day. 2 weeks post discharge from the hospital he was admitted with pericardial tamponade due to  hemorrhagic pericardial effusion undergoing a subxiphoid pericardial window by Dr. Cornelius Moraswen . Since discharge she still remarkably well. He has no shortness of breath or chest pain. I released him to go back to work.  A 2-D echo recently performed 11/06/16 revealed a decline in his ejection fraction to 30-35% range. Based on this, I referred him to Dr. Royann Shiversroitoru for evaluation of ICD implantation for primary prevention. This was performed successfully on 01/10/17 and he has been followed regularly since.  ?  ?Since I saw him a year ago he is remained stable.  He denies chest pain or shortness of breath.  He continues to smoke 4 cigarettes a day.  He was found to have asymptomatic A. fib on CareLink interrogation followed by Dr. Royann Shiversroitoru and was begun on Eliquis followed in the A. fib clinic by Rudi Cocoonna Carroll, NP. ? ? ?Current Meds  ?Medication Sig  ? acetaminophen (TYLENOL) 325 MG tablet Take 2 tablets (650 mg total) by mouth  every 4 (four) hours as needed for headache or mild pain.  ? aspirin EC 81 MG tablet Take 81 mg by mouth daily.  ? atorvastatin (LIPITOR) 10 MG tablet Take 1 tablet (10 mg total) by mouth daily.  ? carvedilol (COREG) 12.5 MG tablet Take 1 tablet (12.5 mg total) by mouth 2 (two) times daily.  ? ELIQUIS 5 MG TABS tablet TAKE 1 TABLET BY MOUTH TWICE A DAY  ? fenofibrate 160 MG tablet TAKE 1 TABLET BY MOUTH EVERY DAY  ? folic acid (FOLVITE) 1 MG tablet Take 1 mg by mouth daily.  ? gabapentin (NEURONTIN) 300 MG capsule Take 300 mg by mouth in the morning, at noon, and at bedtime.  ? HYDROcodone-acetaminophen (NORCO) 7.5-325 MG tablet Take 1 tablet by mouth every 6 (six) hours as needed.  ? lisinopril (ZESTRIL) 2.5 MG tablet Take 1 tablet (2.5 mg total) by mouth daily.  ? methotrexate (RHEUMATREX) 2.5 MG tablet 15mg  by mouth at night on saturdays  ? nitroGLYCERIN (NITROSTAT) 0.4 MG SL tablet Place 1 tablet (0.4 mg total) under the tongue every 5 (five) minutes as needed for chest pain.  ? omeprazole (PRILOSEC) 10 MG capsule Take 10 mg by mouth daily.  ? traZODone (DESYREL) 100 MG tablet Take 100 mg by mouth at bedtime.  ?  ? ?No Known Allergies ? ?Social History  ? ?Socioeconomic History  ? Marital status: Married  ?  Spouse name: Not  on file  ? Number of children: 5  ? Years of education: Not on file  ? Highest education level: Not on file  ?Occupational History  ? 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  ?Tobacco Use  ? Smoking status: Every Day  ?  Packs/day: 0.25  ?  Years: 30.00  ?  Pack years: 7.50  ?  Types: Cigarettes  ? Smokeless tobacco: Never  ?Substance and Sexual Activity  ? Alcohol use: Not Currently  ?  Alcohol/week: 0.0 standard drinks  ? Drug use: Never  ? Sexual activity: Not on file  ?Other Topics Concern  ? Not on file  ?Social History Narrative  ? Married.  Five children total between him and his wife.  He works for [employer] in [employer].    ? ?Social Determinants of  Health  ? ?Financial Resource Strain: Not on file  ?Food Insecurity: Not on file  ?Transportation Needs: Not on file  ?Physical Activity: Not on file  ?Stress: Not on file  ?Social Connections: Not on file  ?Intimate Partner Violence: Not on file  ?  ? ?Review of Systems: ?General: negative for chills, fever, night sweats or weight changes.  ?Cardiovascular: negative for chest pain, dyspnea on exertion, edema, orthopnea, palpitations, paroxysmal nocturnal dyspnea or shortness of breath ?Dermatological: negative for rash ?Respiratory: negative for cough or wheezing ?Urologic: negative for hematuria ?Abdominal: negative for nausea, vomiting, diarrhea, bright red blood per rectum, melena, or hematemesis ?Neurologic: negative for visual changes, syncope, or dizziness ?All other systems reviewed and are otherwise negative except as noted above. ? ? ? ?Blood pressure 100/62, pulse 70, height  (1.854 m), weight 197 lb 9.6 oz (89.6 kg), SpO2 95 %.  ?General appearance: alert and no distress ?Neck: no adenopathy, no carotid bruit, no JVD, supple, symmetrical, trachea midline, and thyroid not enlarged, symmetric, no tenderness/mass/nodules ?Lungs: clear to auscultation bilaterally ?Heart: regular rate and rhythm, S1, S2 normal, no murmur, click, rub or gallop ?Extremities: extremities normal, atraumatic, no cyanosis or edema ?Pulses: 2+ and symmetric ?Skin: Skin color, texture, turgor normal. No rashes or lesions ?Neurologic: Grossly normal ? ?EKG sinus rhythm at 70 with septal Q waves.  I personally reviewed this EKG. ? ?ASSESSMENT AND PLAN:  ? ?ST elevation (STEMI) myocardial infarction involving left anterior descending coronary artery (HCC) ?History of STEMI 06/12/2015 treated with PCI and stenting of the LAD with a Xience drug-eluting stent by myself.  He did have 60% residual AV groove circumflex disease.  His EF at that time was 25 to 35%.  He did come back to the hospital 2 weeks after discharge with a hemorrhagic  pericardial effusion and tamponade and underwent subxiphoid pericardial window by Dr. Cornelius Moras   He denies chest pain or shortness of breath. ? ?Hyperlipidemia LDL goal <70 ?History of hyperlipidemia on statin therapy with lipid profile performed 11/24/2020 revealing total cholesterol of 121, LDL 56 and 8 and HDL 40.  We will recheck a lipid liver profile. ? ?Cigarette nicotine dependence without complication ?Ongoing tobacco abuse of 4 cigarettes a day recalcitrant to respect modification. ? ?Ischemic cardiomyopathy ?History of ischemic cardiomyopathy with an EF in the 30 to 35% range by 2D echo performed 12/10/2018 on carvedilol and low-dose ACE inhibitor.  His blood pressure is fairly soft.  He denies chest pain or shortness of breath. ? ?ICD (implantable cardioverter-defibrillator) in place, placed 01/10/17 MTD ?History of ICD implantation by Dr. Royann Shivers 01/10/2017 at my request which she follows on  an annual basis.  He has not had any discharge.  He at least has several years left of battery life ? ?Paroxysmal atrial fibrillation (HCC) ?History of PAF found on CareLink download of his ICD currently in sinus rhythm on Eliquis oral anticoagulation. ? ? ? ? ? ?Runell Gess MD FACP,FACC,FAHA, FSCAI ?01/17/2022 ?2:32 PM ?

## 2022-01-17 NOTE — Assessment & Plan Note (Signed)
History of STEMI 06/12/2015 treated with PCI and stenting of the LAD with a Xience drug-eluting stent by myself.  He did have 60% residual AV groove circumflex disease.  His EF at that time was 25 to 35%.  He did come back to the hospital 2 weeks after discharge with a hemorrhagic pericardial effusion and tamponade and underwent subxiphoid pericardial window by Dr. Roxy Manns   He denies chest pain or shortness of breath. ?

## 2022-01-17 NOTE — Patient Instructions (Signed)
Medication Instructions:  Your physician recommends that you continue on your current medications as directed. Please refer to the Current Medication list given to you today.  *If you need a refill on your cardiac medications before your next appointment, please call your pharmacy*   Lab Work: Your physician recommends that you return for lab work in: next week or 2 for FASTING lipid/liver profile.  If you have labs (blood work) drawn today and your tests are completely normal, you will receive your results only by: MyChart Message (if you have MyChart) OR A paper copy in the mail If you have any lab test that is abnormal or we need to change your treatment, we will call you to review the results.   Follow-Up: At [facility], you and your health needs are our priority.  As part of our continuing mission to provide you with exceptional heart care, we have created designated Provider Care Teams.  These Care Teams include your primary Cardiologist (physician) and Advanced Practice Providers (APPs -  Physician Assistants and Nurse Practitioners) who all work together to provide you with the care you need, when you need it.  We recommend signing up for the patient portal called "MyChart".  Sign up information is provided on this After Visit Summary.  MyChart is used to connect with patients for Virtual Visits (Telemedicine).  Patients are able to view lab/test results, encounter notes, upcoming appointments, etc.  Non-urgent messages can be sent to your provider as well.   To learn more about what you can do with MyChart, go to [email or web address].    Your next appointment:   12 month(s)  The format for your next appointment:   In Person  Provider:   Jonathan Berry, MD  

## 2022-01-17 NOTE — Assessment & Plan Note (Signed)
History of ischemic cardiomyopathy with an EF in the 30 to 35% range by 2D echo performed 12/10/2018 on carvedilol and low-dose ACE inhibitor.  His blood pressure is fairly soft.  He denies chest pain or shortness of breath. ?

## 2022-01-17 NOTE — Assessment & Plan Note (Signed)
History of hyperlipidemia on statin therapy with lipid profile performed 11/24/2020 revealing total cholesterol of 121, LDL 56 and 8 and HDL 40.  We will recheck a lipid liver profile. ?

## 2022-01-17 NOTE — Assessment & Plan Note (Signed)
Ongoing tobacco abuse of 4 cigarettes a day recalcitrant to respect modification. ?

## 2022-01-17 NOTE — Assessment & Plan Note (Signed)
History of ICD implantation by Dr. Royann Shivers 01/10/2017 at my request which she follows on an annual basis.  He has not had any discharge.  He at least has several years left of battery life ?

## 2022-01-17 NOTE — Assessment & Plan Note (Signed)
History of PAF found on CareLink download of his ICD currently in sinus rhythm on Eliquis oral anticoagulation. ?

## 2022-01-18 ENCOUNTER — Ambulatory Visit: Payer: Self-pay | Admitting: Cardiovascular Disease

## 2022-01-18 ENCOUNTER — Other Ambulatory Visit (HOSPITAL_COMMUNITY): Payer: Self-pay | Admitting: Nurse Practitioner

## 2022-01-26 LAB — HEPATIC FUNCTION PANEL
ALT: 16 IU/L (ref 0–44)
AST: 13 IU/L (ref 0–40)
Albumin: 4.2 g/dL (ref 3.8–4.9)
Alkaline Phosphatase: 67 IU/L (ref 44–121)
Bilirubin Total: 0.3 mg/dL (ref 0.0–1.2)
Bilirubin, Direct: 0.12 mg/dL (ref 0.00–0.40)
Total Protein: 6.7 g/dL (ref 6.0–8.5)

## 2022-01-26 LAB — LIPID PANEL
Chol/HDL Ratio: 3.3 ratio (ref 0.0–5.0)
Cholesterol, Total: 116 mg/dL (ref 100–199)
HDL: 35 mg/dL — ABNORMAL LOW (ref >39)
LDL Chol Calc (NIH): 57 mg/dL (ref 0–99)
Triglycerides: 139 mg/dL (ref 0–149)
VLDL Cholesterol Cal: 24 mg/dL (ref 5–40)

## 2022-03-01 ENCOUNTER — Other Ambulatory Visit: Payer: Self-pay | Admitting: Cardiovascular Disease

## 2022-03-28 ENCOUNTER — Ambulatory Visit (INDEPENDENT_AMBULATORY_CARE_PROVIDER_SITE_OTHER): Payer: Medicare HMO

## 2022-03-28 DIAGNOSIS — I255 Ischemic cardiomyopathy: Secondary | ICD-10-CM

## 2022-03-28 LAB — CUP PACEART REMOTE DEVICE CHECK
Battery Remaining Longevity: 72 mo
Battery Voltage: 2.99 V
Brady Statistic RV Percent Paced: 0.01 %
Date Time Interrogation Session: 20230530043824
HighPow Impedance: 77 Ohm
Implantable Lead Implant Date: 20180314
Implantable Lead Location: 753860
Implantable Pulse Generator Implant Date: 20180314
Lead Channel Impedance Value: 494 Ohm
Lead Channel Impedance Value: 532 Ohm
Lead Channel Pacing Threshold Amplitude: 0.5 V
Lead Channel Pacing Threshold Pulse Width: 0.4 ms
Lead Channel Sensing Intrinsic Amplitude: 18.125 mV
Lead Channel Sensing Intrinsic Amplitude: 18.125 mV
Lead Channel Setting Pacing Amplitude: 2.5 V
Lead Channel Setting Pacing Pulse Width: 0.4 ms
Lead Channel Setting Sensing Sensitivity: 0.3 mV

## 2022-04-13 NOTE — Progress Notes (Signed)
Remote ICD transmission.   

## 2022-06-27 ENCOUNTER — Ambulatory Visit (INDEPENDENT_AMBULATORY_CARE_PROVIDER_SITE_OTHER): Payer: Medicare Other

## 2022-06-27 DIAGNOSIS — I255 Ischemic cardiomyopathy: Secondary | ICD-10-CM | POA: Diagnosis not present

## 2022-06-27 LAB — CUP PACEART REMOTE DEVICE CHECK
Battery Remaining Longevity: 67 mo
Battery Voltage: 2.98 V
Brady Statistic RV Percent Paced: 0.01 %
Date Time Interrogation Session: 20230829031707
HighPow Impedance: 75 Ohm
Implantable Lead Implant Date: 20180314
Implantable Lead Location: 753860
Implantable Pulse Generator Implant Date: 20180314
Lead Channel Impedance Value: 456 Ohm
Lead Channel Impedance Value: 532 Ohm
Lead Channel Pacing Threshold Amplitude: 0.75 V
Lead Channel Pacing Threshold Pulse Width: 0.4 ms
Lead Channel Sensing Intrinsic Amplitude: 16 mV
Lead Channel Sensing Intrinsic Amplitude: 16 mV
Lead Channel Setting Pacing Amplitude: 2.5 V
Lead Channel Setting Pacing Pulse Width: 0.4 ms
Lead Channel Setting Sensing Sensitivity: 0.3 mV

## 2022-07-10 ENCOUNTER — Ambulatory Visit: Payer: Medicare Other | Admitting: Cardiovascular Disease

## 2022-07-18 ENCOUNTER — Other Ambulatory Visit (HOSPITAL_COMMUNITY): Payer: Self-pay | Admitting: Cardiovascular Disease

## 2022-07-18 DIAGNOSIS — I480 Paroxysmal atrial fibrillation: Secondary | ICD-10-CM

## 2022-07-18 NOTE — Telephone Encounter (Signed)
Eliquis 5mg  refill request received. Patient is 61 years old, weight-89.6kg, Crea-0.81 on 05/16/2022 via [place] from [place], [place], and last seen by Dr. Gwenlyn Found on 01/17/2022. Dose is appropriate based on dosing criteria. Will send in refill to requested pharmacy.

## 2022-07-18 NOTE — Telephone Encounter (Signed)
Refill request

## 2022-07-21 NOTE — Progress Notes (Signed)
Remote ICD transmission.   

## 2022-07-31 ENCOUNTER — Encounter: Payer: Self-pay | Admitting: Cardiovascular Disease

## 2022-07-31 ENCOUNTER — Ambulatory Visit: Payer: Medicare Other | Attending: Cardiovascular Disease | Admitting: Cardiovascular Disease

## 2022-07-31 VITALS — BP 112/78 | HR 66 | Ht 73.0 in | Wt 178.4 lb

## 2022-07-31 DIAGNOSIS — I2510 Atherosclerotic heart disease of native coronary artery without angina pectoris: Secondary | ICD-10-CM

## 2022-07-31 DIAGNOSIS — Z95810 Presence of automatic (implantable) cardiac defibrillator: Secondary | ICD-10-CM

## 2022-07-31 DIAGNOSIS — N200 Calculus of kidney: Secondary | ICD-10-CM

## 2022-07-31 DIAGNOSIS — E785 Hyperlipidemia, unspecified: Secondary | ICD-10-CM

## 2022-07-31 DIAGNOSIS — I480 Paroxysmal atrial fibrillation: Secondary | ICD-10-CM

## 2022-07-31 DIAGNOSIS — I5042 Chronic combined systolic (congestive) and diastolic (congestive) heart failure: Secondary | ICD-10-CM | POA: Diagnosis not present

## 2022-07-31 NOTE — Progress Notes (Signed)
Cardiology Office Note    Date:  08/01/2022   ID:  Devin Ellis, DOB [birth year], MRN [id number]  PCP:  Curly Rim, MD  Cardiologist:  Quay Burow, MD; Sanda Klein, MD   Chief Complaint  Patient presents with   ICD check    History of Present Illness:  Devin Ellis is a 61 y.o. male with ischemic CMP (anterior MI 2016, EF 30-35% - last echo 11/06/2016, CHF NYHA class II) and returns in follow-up for his defibrillator.  He had a lengthy episode of atrial fibrillation with RVR lasting about 3 hours in October 2021 and has been on anticoagulation since.  He has infrequent brief episodes of nonsustained VT, on the average every other month.  He has had an uneventful year from a cardiac point of view.  He denies any issues with angina or dyspnea either at rest or with activity.  He has not had lower extremity edema, orthopnea, PND, palpitations, dizziness, syncope or defibrillator discharges.  He denies any falls injuries or serious bleeding problems.  He has not had any focal neurological events.  Earlier this month he had a renal colic and has a roughly 4 mm left kidney stone, close to the UPJ.  It is possible that he will need lithotripsy unless he passes it on his own.  He has been taking Toradol on a regular basis for the pain and has continued taking it even though the pain has now subsided.  He has not had hematuria, fever or chills.  His ICD shows normal function.  Implanted in 2018 but still has roughly 5 years of estimated longevity.  He has had 1 very brief episode of nonsustained VT(197 bpm, 16 beats).  He does not require pacing and has not had any atrial fibrillation.  His OptiVol shows a steep and distinct rise of fluid (decrease in thoracic impedance) just in the last week, coinciding with his problems with kidney stone and taking NSAIDs).  He has a history of rheumatoid arthritis, lower extremity neuropathy and had hemopericardium regarding surgical window 2 weeks  after his infarction in 2016.  Past Medical History:  Diagnosis Date   CAD in native artery, residual 60 LCX 06/14/2015   Cardiomyopathy, ischemic, EF by Echo 40-45% 06/14/2015   Hyperlipidemia LDL goal <70 06/14/2015   Pericardial effusion 06/26/2015   Pericardial tamponade 06/26/2015   Rheumatoid arthritis involving both hands ([place]) 04/2016   S/P angioplasty with stent, to LAD with Xience DES 06/12/15  06/14/2015   ST elevation (STEMI) myocardial infarction involving left anterior descending coronary artery ([place]) 06/12/2015   Tobacco abuse     Past Surgical History:  Procedure Laterality Date   CARDIAC CATHETERIZATION N/A 06/12/2015   Procedure: Left Heart Cath and Coronary Angiography;  Surgeon: Lorretta Harp, MD;  Location: [facility];  Service: Cardiovascular;  Laterality: N/A;   CARDIAC CATHETERIZATION N/A 06/12/2015   Procedure: Coronary Stent Intervention;  Surgeon: Lorretta Harp, MD;  Location: [facility];  Service: Cardiovascular;  Laterality: N/A;   ICD IMPLANT N/A 01/10/2017   Procedure: ICD Implant;  Surgeon: Sanda Klein, MD;  Location: [facility];  Service: Cardiovascular;  Laterality: N/A;   SHOULDER SURGERY Right    Torn bicep   SUBXYPHOID PERICARDIAL WINDOW N/A 06/26/2015   Procedure: SUBXYPHOID PERICARDIAL WINDOW;  Surgeon: Rexene Alberts, MD;  Location: [facility];  Service: Thoracic;  Laterality: N/A;    Current Medications: Outpatient Medications Prior to Visit  Medication Sig  Dispense Refill   aspirin EC 81 MG tablet Take 81 mg by mouth daily.     atorvastatin (LIPITOR) 10 MG tablet Take 1 tablet (10 mg total) by mouth daily. 90 tablet 3   carvedilol (COREG) 12.5 MG tablet TAKE 1 TABLET BY MOUTH 2 TIMES DAILY. 180 tablet 3   [facility] VITAMIN B12 1000 MCG tablet Take 1,000 mcg by mouth daily.     ELIQUIS 5 MG TABS tablet TAKE 1 TABLET BY MOUTH TWICE A DAY 180 tablet 1   esomeprazole (NEXIUM) 40 MG capsule Take 40 mg by mouth daily.      fenofibrate 160 MG tablet TAKE 1 TABLET BY MOUTH EVERY DAY 90 tablet 3   folic acid (FOLVITE) 1 MG tablet Take 1 mg by mouth daily.  3   gabapentin (NEURONTIN) 800 MG tablet Take 800 mg by mouth 3 (three) times daily.     HYDROcodone-acetaminophen (NORCO) 7.5-325 MG tablet Take 1 tablet by mouth every 6 (six) hours as needed.     HYDROcodone-acetaminophen (NORCO/VICODIN) 5-325 MG tablet Take by mouth.     ketorolac (TORADOL) 10 MG tablet Take 10 mg by mouth.     lisinopril (ZESTRIL) 2.5 MG tablet TAKE 1 TABLET BY MOUTH EVERY DAY 90 tablet 3   metFORMIN (GLUCOPHAGE-XR) 500 MG 24 hr tablet Take 500 mg by mouth every morning.     methocarbamol (ROBAXIN) 500 MG tablet Take 500 mg by mouth 3 (three) times daily.     methotrexate (RHEUMATREX) 2.5 MG tablet 15mg  by mouth at night on saturdays     ondansetron (ZOFRAN-ODT) 4 MG disintegrating tablet Take 4 mg by mouth every 8 (eight) hours as needed.     RINVOQ 15 MG TB24 Take 1 tablet by mouth daily.     tamsulosin (FLOMAX) 0.4 MG CAPS capsule Take 0.4 mg by mouth daily.     traZODone (DESYREL) 100 MG tablet Take 100 mg by mouth at bedtime.     acetaminophen (TYLENOL) 325 MG tablet Take 2 tablets (650 mg total) by mouth every 4 (four) hours as needed for headache or mild pain. (Patient not taking: Reported on 07/31/2022)     gabapentin (NEURONTIN) 300 MG capsule Take 300 mg by mouth in the morning, at noon, and at bedtime. (Patient not taking: Reported on 07/31/2022)     nitroGLYCERIN (NITROSTAT) 0.4 MG SL tablet Place 1 tablet (0.4 mg total) under the tongue every 5 (five) minutes as needed for chest pain. (Patient not taking: Reported on 07/31/2022) 25 tablet 4   omeprazole (PRILOSEC) 10 MG capsule Take 10 mg by mouth daily. (Patient not taking: Reported on 07/31/2022)     No facility-administered medications prior to visit.     Allergies:   Patient has no known allergies.   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  Five children total between him and his wife.  He works for [employer] in [employer].     Social Determinants of Health   Financial Resource Strain: Not on file  Food Insecurity: Not on file  Transportation Needs: Not on file  Physical Activity: Not on file  Stress: Not on file  Social Connections: Not on file     Family History:  The patient's family history includes Diabetes in his father and mother; Hypertension in his mother.   ROS:   Please see the history of present illness.    ROS All other systems are reviewed and are negative.  PHYSICAL EXAM:   VS:  BP 112/78 (BP Location: Left Arm, Patient Position: Sitting, Cuff Size: Normal)   Pulse 66   Ht 6\' 1"  (1.854 m)   Wt 178 lb 6.4 oz (80.9 kg)   SpO2 96%   BMI 23.54 kg/m      General: Alert, oriented x3, no distress, healthy left subclavian ICD site Head: no evidence of trauma, PERRL, EOMI, no exophtalmos or lid lag, no myxedema, no xanthelasma; normal ears, nose and oropharynx Neck: normal jugular venous pulsations and no hepatojugular reflux; brisk carotid pulses without delay and no carotid bruits Chest: clear to auscultation, no signs of consolidation by percussion or palpation, normal fremitus, symmetrical and full respiratory excursions Cardiovascular: normal position and quality of the apical impulse, regular rhythm, normal first  and second heart sounds, no murmurs, rubs or gallops Abdomen: no tenderness or distention, no masses by palpation, no abnormal pulsatility or arterial bruits, normal bowel sounds, no hepatosplenomegaly Extremities: no clubbing, cyanosis or edema; 2+ radial, ulnar and brachial pulses bilaterally; 2+ right femoral, posterior tibial and dorsalis pedis pulses; 2+ left femoral, posterior tibial and dorsalis pedis pulses; no subclavian or femoral bruits Neurological: grossly nonfocal Psych: Normal mood and affect    Wt Readings from Last 3 Encounters:  07/31/22 178 lb 6.4 oz (80.9 kg)  01/17/22 197 lb 9.6 oz (89.6 kg)  07/11/21 179 lb 12.8 oz (81.6 kg)      Studies/Labs Reviewed:   EKG:  EKG is  ordered today.  It is very similar to previous tracings and shows normal sinus rhythm with Q waves in leads V1-V3 as well as in 1 and aVL with flat T waves in the precordial leads, normal QTc 415 ms   Recent Labs: 01/25/2022: ALT 16   Lipid Panel    Component Value Date/Time   CHOL 116 01/25/2022 0846   TRIG 139 01/25/2022 0846   HDL 35 (L) 01/25/2022 0846   CHOLHDL 3.3 01/25/2022 0846   CHOLHDL 3.0 06/14/2015 0406   VLDL 24 06/14/2015 0406   LDLCALC 57 01/25/2022 0846     ASSESSMENT:    1. Paroxysmal atrial fibrillation (HCC)   2. Chronic combined systolic and diastolic CHF (congestive heart failure) (HCC)   3. ICD (implantable cardioverter-defibrillator) in place, placed 01/10/17 MTD   4. Coronary artery disease involving native coronary artery of native heart without angina pectoris   5. Dyslipidemia (high LDL; low HDL)   6. Nephrolithiasis      PLAN:  In order of problems listed above:  Afib: No recent recurrence.  On anticoagulation.  CHA2DS2-VASc 2 (CAD, CHF), no history of TIA/stroke or embolic events. CHF: Good functional status. Euvolemic clinically, but OptiVol suggests that he is gaining fluid rapidly, likely due to NSAID daily  use. Stop Toradol. On relatively low-dose of  ACE inhibitor due to blood pressure limitations.  On a moderate dose of carvedilol. ICD: Normal device function.  Continue remote downloads every 3 months. CAD: Asymptomatic, on aspirin and statin and beta blocker HLP: Excellent LDL on statin. Chronically low HDL. Nephrolithiasis: ICD is not a contraindication to ESWL if deemed necessary. Can stop Eliquis temporarily for hematuria or for lithotripsy. Avoid NSAIDs due to risk of CHF exacerbation and increased GI bleeding risk on Eliquis.   Medication Adjustments/Labs and Tests Ordered: Current medicines are reviewed at length with the patient today.  Concerns regarding medicines are outlined above.  Medication changes, Labs and Tests ordered today are listed in the Patient Instructions below. Patient Instructions  Medication Instructions:  No changes *If you need a refill on your cardiac medications before your next appointment, please call your pharmacy*   Lab Work: None ordered If you have labs (blood work) drawn today and your tests are completely normal, you will receive your results only by: MyChart Message (if you have MyChart) OR A paper copy in the mail If you have any lab test that is abnormal or we need to change your treatment, we will call you to review the results.   Testing/Procedures: None ordered   Follow-Up: At [facility], you and your health needs are our priority.  As part of our continuing mission to provide you with exceptional heart care, we have created designated Provider Care Teams.  These Care Teams include your primary Cardiologist (physician) and Advanced Practice Providers (APPs -  Physician Assistants and Nurse Practitioners) who all work together to provide you with the care you need, when you need it.  We recommend signing up for the patient portal called "MyChart".  Sign up information is provided on this After Visit Summary.  MyChart is used to connect with patients for Virtual Visits  (Telemedicine).  Patients are able to view lab/test results, encounter notes, upcoming appointments, etc.  Non-urgent messages can be sent to your provider as well.   To learn more about what you can do with MyChart, go to [email or web address].    Your next appointment:   12 month(s)  The format for your next appointment:   In Person  Provider:   Dr. Royann Shivers  Important Information About Sugar         Signed, Thurmon Fair, MD  08/01/2022 6:18 PM    [facility] [address] [place], [place], [place]  [address] Phone: [phone]; Fax: [phone]

## 2022-07-31 NOTE — Patient Instructions (Signed)
Medication Instructions:  No changes *If you need a refill on your cardiac medications before your next appointment, please call your pharmacy*   Lab Work: None ordered If you have labs (blood work) drawn today and your tests are completely normal, you will receive your results only by: MyChart Message (if you have MyChart) OR A paper copy in the mail If you have any lab test that is abnormal or we need to change your treatment, we will call you to review the results.   Testing/Procedures: None ordered   Follow-Up: At [facility], you and your health needs are our priority.  As part of our continuing mission to provide you with exceptional heart care, we have created designated Provider Care Teams.  These Care Teams include your primary Cardiologist (physician) and Advanced Practice Providers (APPs -  Physician Assistants and Nurse Practitioners) who all work together to provide you with the care you need, when you need it.  We recommend signing up for the patient portal called "MyChart".  Sign up information is provided on this After Visit Summary.  MyChart is used to connect with patients for Virtual Visits (Telemedicine).  Patients are able to view lab/test results, encounter notes, upcoming appointments, etc.  Non-urgent messages can be sent to your provider as well.   To learn more about what you can do with MyChart, go to [email or web address].    Your next appointment:   12 month(s)  The format for your next appointment:   In Person  Provider:   Dr. Croitoru  Important Information About Sugar       

## 2022-08-01 ENCOUNTER — Encounter: Payer: Self-pay | Admitting: Cardiovascular Disease

## 2022-08-10 ENCOUNTER — Telehealth: Payer: Self-pay | Admitting: Cardiovascular Disease

## 2022-08-10 NOTE — Telephone Encounter (Signed)
Outreach made to Devin Ellis to clarify needs.  Requesting last EKG and recent ICD report that indicates Pt programming.  Information faxed as requested.  Await further needs.

## 2022-08-10 NOTE — Telephone Encounter (Signed)
[facility] is calling to get patients  EKG and pace maker perimeters for ICD results before test tomorrow. Request sent to:    Fax # [phone]

## 2022-09-26 ENCOUNTER — Ambulatory Visit (INDEPENDENT_AMBULATORY_CARE_PROVIDER_SITE_OTHER): Payer: Medicare Other

## 2022-09-26 DIAGNOSIS — I255 Ischemic cardiomyopathy: Secondary | ICD-10-CM | POA: Diagnosis not present

## 2022-09-27 LAB — CUP PACEART REMOTE DEVICE CHECK
Battery Remaining Longevity: 61 mo
Battery Voltage: 2.98 V
Brady Statistic RV Percent Paced: 0.01 %
Date Time Interrogation Session: 20231129135757
HighPow Impedance: 88 Ohm
Implantable Lead Connection Status: 753985
Implantable Lead Implant Date: 20180314
Implantable Lead Location: 753860
Implantable Pulse Generator Implant Date: 20180314
Lead Channel Impedance Value: 513 Ohm
Lead Channel Impedance Value: 627 Ohm
Lead Channel Pacing Threshold Amplitude: 0.5 V
Lead Channel Pacing Threshold Pulse Width: 0.4 ms
Lead Channel Sensing Intrinsic Amplitude: 15.75 mV
Lead Channel Sensing Intrinsic Amplitude: 15.75 mV
Lead Channel Setting Pacing Amplitude: 2.5 V
Lead Channel Setting Pacing Pulse Width: 0.4 ms
Lead Channel Setting Sensing Sensitivity: 0.3 mV
Zone Setting Status: 755011
Zone Setting Status: 755011

## 2022-09-30 ENCOUNTER — Other Ambulatory Visit: Payer: Self-pay | Admitting: Cardiovascular Disease

## 2022-09-30 DIAGNOSIS — E782 Mixed hyperlipidemia: Secondary | ICD-10-CM

## 2022-10-09 ENCOUNTER — Other Ambulatory Visit: Payer: Self-pay | Admitting: Cardiovascular Disease

## 2022-10-26 NOTE — Progress Notes (Signed)
Remote ICD transmission.   

## 2022-12-07 ENCOUNTER — Encounter (HOSPITAL_COMMUNITY): Payer: Self-pay | Admitting: *Deleted

## 2022-12-21 ENCOUNTER — Telehealth: Payer: Self-pay

## 2022-12-21 NOTE — Telephone Encounter (Signed)
   Pre-operative Risk Assessment    Patient Name: Devin Ellis  DOB: Oct 23, 1961 MRN: [id number]      Request for Surgical Clearance    Procedure:   Caudal ESI  Date of Surgery:  Clearance TBD                                 Surgeon:  Unknown Surgeon's Group or Practice Name:  [facility] Phone number:  [phone] Fax number:  [phone]   Type of Clearance Requested:   - Pharmacy:  Hold Apixaban (Eliquis) 3 days prior to surgery.   Type of Anesthesia:  Not Indicated   Additional requests/questions:    Marylene Buerger   12/21/2022, 2:59 PM

## 2022-12-22 NOTE — Telephone Encounter (Signed)
Patient with diagnosis of afib on Eliquis for anticoagulation.    Procedure: Caudal ESI Date of procedure: TBD   CHA2DS2-VASc Score = 2   This indicates a 2.2% annual risk of stroke. The patient's score is based upon: CHF History: 1 HTN History: 0 Diabetes History: 0 Stroke History: 0 Vascular Disease History: 1 Age Score: 0 Gender Score: 0      CrCl 60 ml/min  Per office protocol, patient can hold Eliquis for 3 days prior to procedure.    **This guidance is not considered finalized until pre-operative APP has relayed final recommendations.**

## 2022-12-22 NOTE — Telephone Encounter (Signed)
   Patient Name: Devin Ellis  DOB: 05/07/1961 MRN: [id number]  Primary Cardiologist: None  Clinical pharmacists have reviewed the patient's past medical history, labs, and current medications as part of preoperative protocol coverage. The following recommendations have been made:    Patient with diagnosis of afib on Eliquis for anticoagulation.     Procedure: Caudal ESI Date of procedure: TBD     CHA2DS2-VASc Score = 2   This indicates a 2.2% annual risk of stroke. The patient's score is based upon: CHF History: 1 HTN History: 0 Diabetes History: 0 Stroke History: 0 Vascular Disease History: 1 Age Score: 0 Gender Score: 0     CrCl 60 ml/min   Per office protocol, patient can hold Eliquis for 3 days prior to procedure.  Please resume Eliquis as soon as possible postprocedure, at the discretion of the surgeon.   I will route this recommendation to the requesting party via Epic fax function and remove from pre-op pool.  Please call with questions.  Lenna Sciara, NP 12/22/2022, 9:20 AM

## 2022-12-26 ENCOUNTER — Ambulatory Visit: Payer: Medicare HMO

## 2022-12-26 DIAGNOSIS — I255 Ischemic cardiomyopathy: Secondary | ICD-10-CM | POA: Diagnosis not present

## 2022-12-27 LAB — CUP PACEART REMOTE DEVICE CHECK
Battery Remaining Longevity: 56 mo
Battery Voltage: 2.98 V
Brady Statistic RV Percent Paced: 0.01 %
Date Time Interrogation Session: 20240227012203
HighPow Impedance: 70 Ohm
Implantable Lead Connection Status: 753985
Implantable Lead Implant Date: 20180314
Implantable Lead Location: 753860
Implantable Pulse Generator Implant Date: 20180314
Lead Channel Impedance Value: 437 Ohm
Lead Channel Impedance Value: 513 Ohm
Lead Channel Pacing Threshold Amplitude: 0.625 V
Lead Channel Pacing Threshold Pulse Width: 0.4 ms
Lead Channel Sensing Intrinsic Amplitude: 18.875 mV
Lead Channel Sensing Intrinsic Amplitude: 18.875 mV
Lead Channel Setting Pacing Amplitude: 2.5 V
Lead Channel Setting Pacing Pulse Width: 0.4 ms
Lead Channel Setting Sensing Sensitivity: 0.3 mV
Zone Setting Status: 755011
Zone Setting Status: 755011

## 2023-01-14 ENCOUNTER — Other Ambulatory Visit: Payer: Self-pay | Admitting: Cardiovascular Disease

## 2023-01-16 ENCOUNTER — Encounter: Payer: Self-pay | Admitting: Cardiovascular Disease

## 2023-01-17 ENCOUNTER — Other Ambulatory Visit: Payer: Self-pay

## 2023-01-17 ENCOUNTER — Encounter: Payer: Self-pay | Admitting: Cardiovascular Disease

## 2023-01-17 DIAGNOSIS — I480 Paroxysmal atrial fibrillation: Secondary | ICD-10-CM

## 2023-01-17 MED ORDER — LISINOPRIL 2.5 MG PO TABS: 2.5000 mg | ORAL_TABLET | Freq: Every day | ORAL | 3 refills | Status: DC

## 2023-01-17 MED ORDER — APIXABAN 5 MG PO TABS: 5.0000 mg | ORAL_TABLET | Freq: Two times a day (BID) | ORAL | 1 refills | Status: DC

## 2023-01-17 MED ORDER — CARVEDILOL 12.5 MG PO TABS: 12.5000 mg | ORAL_TABLET | Freq: Two times a day (BID) | ORAL | 3 refills | Status: DC

## 2023-01-18 NOTE — Telephone Encounter (Signed)
I was unable to reach the patient. Left patient a voicemail to call the office. All desired medications was sent to desired pharmacy.

## 2023-01-18 NOTE — Telephone Encounter (Signed)
All desired medication was sent to desired pharmacy

## 2023-01-31 NOTE — Progress Notes (Signed)
Remote ICD transmission.   

## 2023-02-07 ENCOUNTER — Telehealth: Payer: Self-pay

## 2023-02-07 NOTE — Telephone Encounter (Signed)
Left message for pt to call back  °

## 2023-02-13 ENCOUNTER — Ambulatory Visit: Payer: Medicare HMO | Attending: Cardiovascular Disease | Admitting: Cardiovascular Disease

## 2023-02-13 ENCOUNTER — Encounter: Payer: Self-pay | Admitting: Cardiovascular Disease

## 2023-02-13 VITALS — BP 110/62 | HR 64 | Ht 73.0 in | Wt 173.0 lb

## 2023-02-13 DIAGNOSIS — I3139 Other pericardial effusion (noninflammatory): Secondary | ICD-10-CM

## 2023-02-13 DIAGNOSIS — Z95810 Presence of automatic (implantable) cardiac defibrillator: Secondary | ICD-10-CM

## 2023-02-13 DIAGNOSIS — I480 Paroxysmal atrial fibrillation: Secondary | ICD-10-CM

## 2023-02-13 DIAGNOSIS — F17210 Nicotine dependence, cigarettes, uncomplicated: Secondary | ICD-10-CM

## 2023-02-13 DIAGNOSIS — I255 Ischemic cardiomyopathy: Secondary | ICD-10-CM | POA: Diagnosis not present

## 2023-02-13 DIAGNOSIS — I2102 ST elevation (STEMI) myocardial infarction involving left anterior descending coronary artery: Secondary | ICD-10-CM | POA: Diagnosis not present

## 2023-02-13 DIAGNOSIS — E785 Hyperlipidemia, unspecified: Secondary | ICD-10-CM

## 2023-02-13 DIAGNOSIS — I2510 Atherosclerotic heart disease of native coronary artery without angina pectoris: Secondary | ICD-10-CM | POA: Diagnosis not present

## 2023-02-13 NOTE — Assessment & Plan Note (Signed)
Ongoing tobacco use of 3 to 4 cigarettes a day.

## 2023-02-13 NOTE — Assessment & Plan Note (Signed)
History of pericardial tamponade status post subxiphoid pericardial window performed by Dr. Cornelius Moras  in the past.

## 2023-02-13 NOTE — Assessment & Plan Note (Signed)
History of the status post anterior wall STEMI 06/12/2015 treated with stenting of his mid LAD by myself via the right radial approach.  He has been asymptomatic since.

## 2023-02-13 NOTE — Assessment & Plan Note (Signed)
History of hyperlipidemia on statin therapy with lipid profile performed a year ago revealing a total cholesterol of 116, LDL 57 and HDL of 35.

## 2023-02-13 NOTE — Assessment & Plan Note (Signed)
History of PAF and Barney Russomanno in sinus rhythm on Eliquis oral anticoagulation.

## 2023-02-13 NOTE — Patient Instructions (Signed)
Medication Instructions:  Your physician recommends that you continue on your current medications as directed. Please refer to the Current Medication list given to you today.  *If you need a refill on your cardiac medications before your next appointment, please call your pharmacy*   Testing/Procedures: Your physician has requested that you have an echocardiogram. Echocardiography is a painless test that uses sound waves to create images of your heart. It provides your doctor with information about the size and shape of your heart and how well your heart's chambers and valves are working. This procedure takes approximately one hour. There are no restrictions for this procedure. Please do NOT wear cologne, perfume, aftershave, or lotions (deodorant is allowed). Please arrive 15 minutes prior to your appointment time. This procedure will be done at [address]    Follow-Up: At [facility], you and your health needs are our priority.  As part of our continuing mission to provide you with exceptional heart care, we have created designated Provider Care Teams.  These Care Teams include your primary Cardiologist (physician) and Advanced Practice Providers (APPs -  Physician Assistants and Nurse Practitioners) who all work together to provide you with the care you need, when you need it.  We recommend signing up for the patient portal called "MyChart".  Sign up information is provided on this After Visit Summary.  MyChart is used to connect with patients for Virtual Visits (Telemedicine).  Patients are able to view lab/test results, encounter notes, upcoming appointments, etc.  Non-urgent messages can be sent to your provider as well.   To learn more about what you can do with MyChart, go to [email or web address].    Your next appointment:   12 month(s)  Provider:   Jonathan Berry, MD  

## 2023-02-13 NOTE — Progress Notes (Signed)
02/13/2023 [name]   05-06-1961  [id number]  Primary Physician Corrington, Meredith Mody, MD Primary Cardiologist: Runell Gess MD Nicholes Calamity, [place]  HPI:  Roxie Gueye is a 62 y.o.   thin-appearing married Caucasian male father of 5 children who worked at [employer] in the past but has been unemployed since his heart attack.[name]  He just started seeing a new primary care physician at [facility].  I last saw him in the office 01/17/2022.  [name] accompanied by his wife Toniann Fail today.  He had an acute anterior wall myocardial infarction on 06/12/15 treated with stenting of his mid LAD with a drug-eluting stent in the right radial approach by myself. His EF at that time was 25-35% with anteroapical wall motion and mobility. He had a 60% AV groove circumflex stenosis. His risk factor profile is notable for 80 pack years of tobacco smoking 2 packs a day up until the point now smoking 4 cigarettes a day. 2 weeks post discharge from the hospital he was admitted with pericardial tamponade due to  hemorrhagic pericardial effusion undergoing a subxiphoid pericardial window by Dr. Cornelius Moras . Since discharge she still remarkably well. He has no shortness of breath or chest pain. I released him to go back to work.  A 2-D echo recently performed 11/06/16 revealed a decline in his ejection fraction to 30-35% range. Based on this, I referred him to Dr. Royann Shivers for evaluation of ICD implantation for primary prevention. This was performed successfully on 01/10/17 and he has been followed regularly since.    Since I saw him a year ago he is remained stable.  He denies chest pain or shortness of breath.  He walks his dog and does yard work without symptoms.  He continues to smoke 4 cigarettes a day.  He was found to have asymptomatic A. fib on [facility] interrogation followed by Dr. Royann Shivers and was begun on Eliquis    Current Meds  Medication Sig   acetaminophen (TYLENOL) 325 MG tablet Take 2 tablets (650 mg total) by  mouth every 4 (four) hours as needed for headache or mild pain.   apixaban (ELIQUIS) 5 MG TABS tablet Take 1 tablet (5 mg total) by mouth 2 (two) times daily.   aspirin EC 81 MG tablet Take 81 mg by mouth daily.   atorvastatin (LIPITOR) 20 MG tablet TAKE ONE TABLET BY MOUTH AT BEDTIME.   carvedilol (COREG) 12.5 MG tablet Take 1 tablet (12.5 mg total) by mouth 2 (two) times daily.   [employer] VITAMIN B12 1000 MCG tablet Take 1,000 mcg by mouth daily.   esomeprazole (NEXIUM) 40 MG capsule Take 40 mg by mouth daily.   fenofibrate 160 MG tablet TAKE 1 TABLET BY MOUTH EVERY DAY   folic acid (FOLVITE) 1 MG tablet Take 1 mg by mouth daily.   gabapentin (NEURONTIN) 800 MG tablet Take 800 mg by mouth 3 (three) times daily.   HYDROcodone-acetaminophen (NORCO) 7.5-325 MG tablet Take 1 tablet by mouth every 6 (six) hours as needed.   lisinopril (ZESTRIL) 2.5 MG tablet Take 1 tablet (2.5 mg total) by mouth daily.   metFORMIN (GLUCOPHAGE-XR) 500 MG 24 hr tablet Take 500 mg by mouth every morning.   methocarbamol (ROBAXIN) 500 MG tablet Take 500 mg by mouth 3 (three) times daily.   methotrexate (RHEUMATREX) 2.5 MG tablet  by mouth at night on saturdays   nitroGLYCERIN (NITROSTAT) 0.4 MG SL tablet Place 1 tablet (0.4 mg total) under the tongue  every 5 (five) minutes as needed for chest pain.   traZODone (DESYREL) 100 MG tablet Take 100 mg by mouth at bedtime.     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  Tobacco Use   Smoking status: Every Day    Packs/day: 0.25    Years: 30.00    Additional pack years: 0.00    Total pack years: 7.50    Types: Cigarettes   Smokeless tobacco: Never  Substance and Sexual Activity   Alcohol use: Not Currently     Alcohol/week: 0.0 standard drinks of alcohol   Drug use: Never   Sexual activity: Not on file  Other Topics Concern   Not on file  Social History Narrative   Married.  Five children total between him and his wife.  He works for [employer] in [employer].     Social Determinants of Health   Financial Resource Strain: Not on file  Food Insecurity: Not on file  Transportation Needs: Not on file  Physical Activity: Not on file  Stress: Not on file  Social Connections: Not on file  Intimate Partner Violence: Not on file     Review of Systems: General: negative for chills, fever, night sweats or weight changes.  Cardiovascular: negative for chest pain, dyspnea on exertion, edema, orthopnea, palpitations, paroxysmal nocturnal dyspnea or shortness of breath Dermatological: negative for rash Respiratory: negative for cough or wheezing Urologic: negative for hematuria Abdominal: negative for nausea, vomiting, diarrhea, bright red blood per rectum, melena, or hematemesis Neurologic: negative for visual changes, syncope, or dizziness All other systems reviewed and are otherwise negative except as noted above.    Blood pressure 110/62, pulse 64, height 6\' 1"  (1.854 m), weight 173 lb (78.5 kg), SpO2 97 %.  General appearance: alert and no distress Neck: no adenopathy, no carotid bruit, no JVD, supple, symmetrical, trachea midline, and thyroid not enlarged, symmetric, no tenderness/mass/nodules Lungs: clear to auscultation bilaterally Heart: regular rate and rhythm, S1, S2 normal, no murmur, click, rub or gallop Extremities: extremities normal, atraumatic, no cyanosis or edema Pulses: 2+ and symmetric Skin: Skin color, texture, turgor normal. No rashes or lesions Neurologic: Grossly normal  EKG sinus rhythm at 64 with septal Q waves.  I personally reviewed this EKG.  ASSESSMENT AND PLAN:   ST elevation (STEMI) myocardial infarction involving left anterior descending coronary artery  (HCC) History of the status post anterior wall STEMI 06/12/2015 treated with stenting of his mid LAD by myself via the right radial approach.  He has been asymptomatic since.  Hyperlipidemia LDL goal <70 History of hyperlipidemia on statin therapy with lipid profile performed a year ago revealing a total cholesterol of 116, LDL 57 and HDL of 35.  Cigarette nicotine dependence without complication Ongoing tobacco use of 3 to 4 cigarettes a day.  Ischemic cardiomyopathy History of ischemic cardiomyopathy with an EF in the 25 to 30% range on GDMT and completely asymptomatic.  Pericardial effusion History of pericardial tamponade status post subxiphoid pericardial window performed by Dr. Cornelius Moras  in the past.  ICD (implantable cardioverter-defibrillator) in place, placed 01/10/17 MTD History of ICD implantation by Dr. Royann Shivers at my request for primary prevention 01/10/2017 which he  follows.  Paroxysmal atrial fibrillation (HCC) History of PAF and Linnie Mcglocklin in sinus rhythm on Eliquis oral anticoagulation.     Runell Gess MD FACP,FACC,FAHA, [facility] 02/13/2023 10:25 AM

## 2023-02-13 NOTE — Assessment & Plan Note (Signed)
History of ICD implantation by Dr. Royann Shivers at my request for primary prevention 01/10/2017 which he follows.

## 2023-02-13 NOTE — Assessment & Plan Note (Signed)
History of ischemic cardiomyopathy with an EF in the 25 to 30% range on GDMT and completely asymptomatic.

## 2023-03-15 ENCOUNTER — Ambulatory Visit (HOSPITAL_COMMUNITY): Payer: Medicare HMO | Attending: Cardiology

## 2023-03-15 DIAGNOSIS — E785 Hyperlipidemia, unspecified: Secondary | ICD-10-CM | POA: Diagnosis present

## 2023-03-15 DIAGNOSIS — I255 Ischemic cardiomyopathy: Secondary | ICD-10-CM | POA: Diagnosis not present

## 2023-03-15 DIAGNOSIS — I2510 Atherosclerotic heart disease of native coronary artery without angina pectoris: Secondary | ICD-10-CM | POA: Diagnosis present

## 2023-03-15 LAB — ECHOCARDIOGRAM COMPLETE
Area-P 1/2: 3.53 cm2
S' Lateral: 3.6 cm

## 2023-03-15 MED ORDER — PERFLUTREN LIPID MICROSPHERE
1.0000 mL | INTRAVENOUS | Status: AC | PRN
Start: 2023-03-15 — End: 2023-03-15
  Administered 2023-03-15: 2 mL via INTRAVENOUS

## 2023-03-27 ENCOUNTER — Ambulatory Visit: Payer: Medicare HMO

## 2023-06-26 ENCOUNTER — Ambulatory Visit (INDEPENDENT_AMBULATORY_CARE_PROVIDER_SITE_OTHER): Payer: Medicare HMO

## 2023-06-26 DIAGNOSIS — I255 Ischemic cardiomyopathy: Secondary | ICD-10-CM

## 2023-06-26 DIAGNOSIS — I480 Paroxysmal atrial fibrillation: Secondary | ICD-10-CM

## 2023-06-27 ENCOUNTER — Other Ambulatory Visit: Payer: Self-pay | Admitting: Cardiovascular Disease

## 2023-06-27 DIAGNOSIS — I480 Paroxysmal atrial fibrillation: Secondary | ICD-10-CM

## 2023-06-27 LAB — CUP PACEART REMOTE DEVICE CHECK
Battery Remaining Longevity: 52 mo
Battery Voltage: 2.97 V
Brady Statistic RV Percent Paced: 0.01 %
Date Time Interrogation Session: 20240828013828
HighPow Impedance: 67 Ohm
Implantable Lead Connection Status: 753985
Implantable Lead Implant Date: 20180314
Implantable Lead Location: 753860
Implantable Pulse Generator Implant Date: 20180314
Lead Channel Impedance Value: 437 Ohm
Lead Channel Impedance Value: 494 Ohm
Lead Channel Pacing Threshold Amplitude: 0.75 V
Lead Channel Pacing Threshold Pulse Width: 0.4 ms
Lead Channel Sensing Intrinsic Amplitude: 16.25 mV
Lead Channel Sensing Intrinsic Amplitude: 16.25 mV
Lead Channel Setting Pacing Amplitude: 2.5 V
Lead Channel Setting Pacing Pulse Width: 0.4 ms
Lead Channel Setting Sensing Sensitivity: 0.3 mV
Zone Setting Status: 755011
Zone Setting Status: 755011

## 2023-07-09 NOTE — Progress Notes (Signed)
Remote ICD transmission.   

## 2023-09-03 ENCOUNTER — Ambulatory Visit: Payer: Medicare HMO | Attending: Cardiovascular Disease | Admitting: Cardiovascular Disease

## 2023-09-03 ENCOUNTER — Encounter: Payer: Self-pay | Admitting: Cardiovascular Disease

## 2023-09-03 VITALS — BP 110/70 | HR 76 | Ht 73.0 in | Wt 181.2 lb

## 2023-09-03 DIAGNOSIS — Z95810 Presence of automatic (implantable) cardiac defibrillator: Secondary | ICD-10-CM | POA: Diagnosis not present

## 2023-09-03 DIAGNOSIS — I5042 Chronic combined systolic (congestive) and diastolic (congestive) heart failure: Secondary | ICD-10-CM

## 2023-09-03 DIAGNOSIS — I480 Paroxysmal atrial fibrillation: Secondary | ICD-10-CM | POA: Diagnosis not present

## 2023-09-03 DIAGNOSIS — I2510 Atherosclerotic heart disease of native coronary artery without angina pectoris: Secondary | ICD-10-CM | POA: Diagnosis not present

## 2023-09-03 NOTE — Progress Notes (Signed)
Cardiology Office Note    Date:  09/03/2023   ID:  Kennith Morss, DOB 05/05/1961, MRN [id number]  PCP:  Vivien Presto, MD  Cardiologist:  Nanetta Batty, MD; Thurmon Fair, MD   No chief complaint on file.   History of Present Illness:  Devin Ellis is a 62 y.o. male with ischemic CMP (anterior MI 2016, EF 30-35% - last echo 11/06/2016, CHF NYHA class II) and returns in follow-up for his defibrillator.  He had a lengthy episode of atrial fibrillation with RVR lasting about 3 hours in October 2021 and has been on anticoagulation since.  He has infrequent brief episodes of nonsustained VT, on the average every other month.  He has had an uneventful year from a cardiac point of view.  He denies any issues with angina or dyspnea either at rest or with activity.  He has not had lower extremity edema, orthopnea, PND, palpitations, dizziness, syncope or defibrillator discharges.   He has not had any focal neurological events.  He remains physically active.  He mows 6 lawns.  [name] always helping with various home construction projects.  He has not held back by shortness of breath or chest discomfort.  He has not had any falls, injuries or bleeding problems.  His single-chamber Medtronic Visio AF ICD shows normal function.  Implanted in 2018 but still has roughly 4 years of battery longevity. No need for pacing and no interventions for VT extension orientation.  His device has reported 9 episodes of nonsustained VT in the last year, but in the last 3 months, all of the 3 recorded episodes are paroxysmal atrial tachycardia with 1: 1 AV conduction or a very brief burst of atrial fibrillation.  There has been no sustained atrial fibrillation in the last 12 months.  OptiVol has been briefly and marginally out of range at 3 times this year.  He has a history of rheumatoid arthritis, lower extremity neuropathy and had hemopericardium regarding surgical window 2 weeks after his infarction in  2016.  Past Medical History:  Diagnosis Date   CAD in native artery, residual 60 LCX 06/14/2015   Cardiomyopathy, ischemic, EF by Echo 40-45% 06/14/2015   Hyperlipidemia LDL goal <70 06/14/2015   Pericardial effusion 06/26/2015   Pericardial tamponade 06/26/2015   Rheumatoid arthritis involving both hands (HCC) 04/2016   S/P angioplasty with stent, to LAD with Xience DES 06/12/15  06/14/2015   ST elevation (STEMI) myocardial infarction involving left anterior descending coronary artery (HCC) 06/12/2015   Tobacco abuse     Past Surgical History:  Procedure Laterality Date   CARDIAC CATHETERIZATION N/A 06/12/2015   Procedure: Left Heart Cath and Coronary Angiography;  Surgeon: Runell Gess, MD;  [facility] INVASIVE CV LAB;  Service: Cardiovascular;  Laterality: N/A;   CARDIAC CATHETERIZATION N/A 06/12/2015   Procedure: Coronary Stent Intervention;  Surgeon: Runell Gess, MD;  Location: [facility] INVASIVE CV LAB;  Service: Cardiovascular;  Laterality: N/A;   ICD IMPLANT N/A 01/10/2017   Procedure: ICD Implant;  Surgeon: Thurmon Fair, MD;  Location: [facility] INVASIVE CV LAB;  Service: Cardiovascular;  Laterality: N/A;   SHOULDER SURGERY Right    Torn bicep   SUBXYPHOID PERICARDIAL WINDOW N/A 06/26/2015   Procedure: SUBXYPHOID PERICARDIAL WINDOW;  Surgeon: Purcell Nails, MD;  Location: [facility] OR;  Service: Thoracic;  Laterality: N/A;    Current Medications: Outpatient Medications Prior to Visit  Medication Sig Dispense Refill   acetaminophen (TYLENOL) 325 MG tablet Take 2 tablets (  650 mg total) by mouth every 4 (four) hours as needed for headache or mild pain.     aspirin EC 81 MG tablet Take 81 mg by mouth daily.     atorvastatin (LIPITOR) 20 MG tablet TAKE ONE TABLET BY MOUTH AT BEDTIME. 90 tablet 3   carvedilol (COREG) 12.5 MG tablet Take 1 tablet (12.5 mg total) by mouth 2 (two) times daily. 180 tablet 3   CVS VITAMIN B12 1000 MCG tablet Take 1,000 mcg by mouth daily.     ELIQUIS 5 MG TABS  tablet TAKE 1 TABLET BY MOUTH TWICE A DAY 180 tablet 1   esomeprazole (NEXIUM) 40 MG capsule Take 40 mg by mouth daily.     fenofibrate 160 MG tablet TAKE 1 TABLET BY MOUTH EVERY DAY 90 tablet 3   folic acid (FOLVITE) 1 MG tablet Take 1 mg by mouth daily.  3   gabapentin (NEURONTIN) 800 MG tablet Take 800 mg by mouth 3 (three) times daily.     lisinopril (ZESTRIL) 2.5 MG tablet Take 1 tablet (2.5 mg total) by mouth daily. 90 tablet 3   metFORMIN (GLUCOPHAGE-XR) 500 MG 24 hr tablet Take 500 mg by mouth every morning.     methocarbamol (ROBAXIN) 500 MG tablet Take 500 mg by mouth 3 (three) times daily.     methotrexate (RHEUMATREX) 2.5 MG tablet 15mg  by mouth at night on saturdays     traZODone (DESYREL) 100 MG tablet Take 100 mg by mouth at bedtime.     HYDROcodone-acetaminophen (NORCO) 7.5-325 MG tablet Take 1 tablet by mouth every 6 (six) hours as needed. (Patient not taking: Reported on 09/03/2023)     nitroGLYCERIN (NITROSTAT) 0.4 MG SL tablet Place 1 tablet (0.4 mg total) under the tongue every 5 (five) minutes as needed for chest pain. (Patient not taking: Reported on 09/03/2023) 25 tablet 4   No facility-administered medications prior to visit.     Allergies:   Patient has no known allergies.   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  Tobacco Use   Smoking status: Every Day    Current packs/day: 0.25    Average packs/day: 0.3 packs/day for 30.0 years (7.5 ttl pk-yrs)    Types: Cigarettes   Smokeless tobacco: Never   Tobacco comments:    09/02/2024 patient smokes about 3-4 cigarettes daily depending on the day.  Substance and Sexual Activity   Alcohol use: Not Currently    Alcohol/week: 0.0 standard drinks of alcohol   Drug use: Never    Sexual activity: Not on file  Other Topics Concern   Not on file  Social History Narrative   Married.  Five children total between him and his wife.  He works for [employer] in [employer].     Social Determinants of Health   Financial Resource Strain: Medium Risk (05/01/2023)   [facility]   Overall Financial Resource Strain (CARDIA)    Difficulty of Paying Living Expenses: Somewhat hard  Food Insecurity: Food Insecurity Present (05/01/2023)   Received from [facility]   Hunger Vital Sign    Worried About Running Out of Food in the Last Year: Sometimes true    Ran Out of Food  in the Last Year: Sometimes true  Transportation Needs: No Transportation Needs (05/01/2023)   Received from [facility] - Transportation    Lack of Transportation (Medical): No    Lack of Transportation (Non-Medical): No  Physical Activity: Unknown (05/01/2023)   [facility] from [facility]   Exercise Vital Sign    Days of Exercise per Week: 0 days    Minutes of Exercise per Session: Not on file  Recent Concern: Physical Activity - Inactive (03/13/2023)   Received from [facility]   Exercise Vital Sign    Days of Exercise per Week: 7 days    Minutes of Exercise per Session: 0 min  Stress: Stress Concern Present (05/01/2023)   Received from [facility] - Occupational Stress Questionnaire    Feeling of Stress : To some extent  Social Connections: Moderately Integrated (05/01/2023)   Received from [facility]   Social Network    How would you rate your social network (family, work, friends)?: Adequate participation with social networks     Family History:  The patient's family history includes Diabetes in his father and mother; Hypertension in his mother.   ROS:   Please see the history of present illness.    ROS All other systems are reviewed and are negative.  PHYSICAL EXAM:   VS:  BP 110/70 (BP Location: Left Arm, Patient  Position: Sitting, Cuff Size: Normal)   Pulse 76   Ht 6\' 1"  (1.854 m)   Wt 181 lb 3.2 oz (82.2 kg)   SpO2 93%   BMI 23.91 kg/m      General: Alert, oriented x3, no distress, healthy left subclavian ICD site Head: no evidence of trauma, PERRL, EOMI, no exophtalmos or lid lag, no myxedema, no xanthelasma; normal ears, nose and oropharynx Neck: normal jugular venous pulsations and no hepatojugular reflux; brisk carotid pulses without delay and no carotid bruits Chest: clear to auscultation, no signs of consolidation by percussion or palpation, normal fremitus, symmetrical and full respiratory excursions Cardiovascular: normal position and quality of the apical impulse, regular rhythm, normal first and second heart sounds, no murmurs, rubs or gallops Abdomen: no tenderness or distention, no masses by palpation, no abnormal pulsatility or arterial bruits, normal bowel sounds, no hepatosplenomegaly Extremities: no clubbing, cyanosis or edema; 2+ radial, ulnar and brachial pulses bilaterally; 2+ right femoral, posterior tibial and dorsalis pedis pulses; 2+ left femoral, posterior tibial and dorsalis pedis pulses; no subclavian or femoral bruits Neurological: grossly nonfocal Psych: Normal mood and affect    Wt Readings from Last 3 Encounters:  09/03/23 181 lb 3.2 oz (82.2 kg)  02/13/23 173 lb (78.5 kg)  07/31/22 178 lb 6.4 oz (80.9 kg)     Studies Reviewed: [name]       EKG Interpretation Date/Time:  Monday September 03 2023 08:10:52 EST Ventricular Rate:  76 PR Interval:  144 QRS Duration:  86 QT Interval:  360 QTC Calculation: 405 R Axis:   62  Text Interpretation: Normal sinus rhythm Septal infarct (cited on or before 17-Aug-2020) When compared with ECG of 14-Sep-2020 09:00, No significant change since last tracing Confirmed by Dominque Marlin [phone]) on 09/03/2023 8:14:33 AM   Risk Assessment/Calculations:    CHA2DS2-VASc Score = 2   This indicates a 2.2% annual risk of stroke. The  patient's score is based upon: CHF History: 1 HTN History: 0 Diabetes History: 0 Stroke History: 0 Vascular Disease History: 1 Age Score: 0 Gender Score:  0         Recent Labs: No results found for requested labs within last 365 days.   Lipid Panel    Component Value Date/Time   CHOL 116 01/25/2022 0846   TRIG 139 01/25/2022 0846   HDL 35 (L) 01/25/2022 0846   CHOLHDL 3.3 01/25/2022 0846   CHOLHDL 3.0 06/14/2015 0406   VLDL 24 06/14/2015 0406   LDLCALC 57 01/25/2022 0846     ASSESSMENT:    1. Paroxysmal atrial fibrillation (HCC)   2. Chronic combined systolic and diastolic CHF (congestive heart failure) (HCC)   3. ICD (implantable cardioverter-defibrillator) in place   4. Coronary artery disease involving native coronary artery of native heart without angina pectoris       PLAN:  In order of problems listed above:  Afib: No recent recurrence, none seen in the last 12 months.  On anticoagulation.  CHA2DS2-VASc 2 (CAD, CHF), no history of TIA/stroke or embolic events. CHF: NYHA functional class I. Euvolemic clinically, although OptiVol is occasionally out of range.  On relatively low-dose of ACE inhibitor due to blood pressure limitations.  On a moderate dose of carvedilol.  No change in medications. ICD: Normal device function.  Continue remote downloads every 3 months. CAD: Asymptomatic, on aspirin and statin and beta blocker HLP: Last labs were checked in March 2023 and his LDL was excellent, but he has a chronically low HDL. Nephrolithiasis: No recurrence of symptoms or hematuria in a year.       Dispo: Check c-Met and lipids today.  Follow-up in 1 year.  Continue remote ICD downloads every 3 months.  Signed, Thurmon Fair, MD  09/03/2023 8:59 AM    [facility] [address] [place], [place], [place]  [address] Phone: [phone]; Fax: [phone]  One of the medications

## 2023-09-03 NOTE — Patient Instructions (Addendum)
Medication Instructions:  Your physician recommends that you continue on your current medications as directed. Please refer to the Current Medication list given to you today.  *If you need a refill on your cardiac medications before your next appointment, please call your pharmacy*   Lab Work:  Lipid Panel and CMET (Comprehensive Metabolic Panel) today If you have labs (blood work) drawn today and your tests are completely normal, you will receive your results only by: MyChart Message (if you have MyChart) OR A paper copy in the mail If you have any lab test that is abnormal or we need to change your treatment, we will call you to review the results.    Follow-Up: At [facility], you and your health needs are our priority.  As part of our continuing mission to provide you with exceptional heart care, we have created designated Provider Care Teams.  These Care Teams include your primary Cardiologist (physician) and Advanced Practice Providers (APPs -  Physician Assistants and Nurse Practitioners) who all work together to provide you with the care you need, when you need it.   Your next appointment:   12 month(s)  Provider:   Thurmon Fair, MD

## 2023-09-04 LAB — LIPID PANEL
Chol/HDL Ratio: 2.9 ratio (ref 0.0–5.0)
Cholesterol, Total: 115 mg/dL (ref 100–199)
HDL: 40 mg/dL (ref >39)
LDL Chol Calc (NIH): 53 mg/dL (ref 0–99)
Triglycerides: 126 mg/dL (ref 0–149)
VLDL Cholesterol Cal: 22 mg/dL (ref 5–40)

## 2023-09-04 LAB — COMPREHENSIVE METABOLIC PANEL
ALT: 12 [IU]/L (ref 0–44)
AST: 14 [IU]/L (ref 0–40)
Albumin: 4.2 g/dL (ref 3.8–4.9)
Alkaline Phosphatase: 69 [IU]/L (ref 44–121)
BUN/Creatinine Ratio: 22 (ref 10–24)
BUN: 17 mg/dL (ref 8–27)
Bilirubin Total: 0.3 mg/dL (ref 0.0–1.2)
CO2: 28 mmol/L (ref 20–29)
Calcium: 9.5 mg/dL (ref 8.6–10.2)
Chloride: 101 mmol/L (ref 96–106)
Creatinine, Ser: 0.77 mg/dL (ref 0.76–1.27)
Globulin, Total: 2.4 g/dL (ref 1.5–4.5)
Glucose: 83 mg/dL (ref 70–99)
Potassium: 5.1 mmol/L (ref 3.5–5.2)
Sodium: 140 mmol/L (ref 134–144)
Total Protein: 6.6 g/dL (ref 6.0–8.5)
eGFR: 102 mL/min/{1.73_m2} (ref >59)

## 2023-09-25 ENCOUNTER — Ambulatory Visit: Payer: Medicare HMO

## 2023-09-27 ENCOUNTER — Other Ambulatory Visit: Payer: Self-pay | Admitting: Cardiovascular Disease

## 2023-09-27 DIAGNOSIS — E782 Mixed hyperlipidemia: Secondary | ICD-10-CM

## 2023-10-10 ENCOUNTER — Encounter: Payer: Self-pay | Admitting: Cardiovascular Disease

## 2023-10-10 MED ORDER — NITROGLYCERIN 0.4 MG SL SUBL: 0.4000 mg | SUBLINGUAL_TABLET | SUBLINGUAL | 4 refills | Status: AC | PRN

## 2023-12-25 ENCOUNTER — Ambulatory Visit (INDEPENDENT_AMBULATORY_CARE_PROVIDER_SITE_OTHER): Payer: Medicare HMO

## 2023-12-25 DIAGNOSIS — I480 Paroxysmal atrial fibrillation: Secondary | ICD-10-CM

## 2023-12-26 LAB — CUP PACEART REMOTE DEVICE CHECK
Battery Remaining Longevity: 47 mo
Battery Voltage: 2.97 V
Brady Statistic RV Percent Paced: 0.01 %
Date Time Interrogation Session: 20250225022606
HighPow Impedance: 83 Ohm
Implantable Lead Connection Status: 753985
Implantable Lead Implant Date: 20180314
Implantable Lead Location: 753860
Implantable Pulse Generator Implant Date: 20180314
Lead Channel Impedance Value: 399 Ohm
Lead Channel Impedance Value: 494 Ohm
Lead Channel Pacing Threshold Amplitude: 0.75 V
Lead Channel Pacing Threshold Pulse Width: 0.4 ms
Lead Channel Sensing Intrinsic Amplitude: 15.5 mV
Lead Channel Sensing Intrinsic Amplitude: 15.5 mV
Lead Channel Setting Pacing Amplitude: 2.5 V
Lead Channel Setting Pacing Pulse Width: 0.4 ms
Lead Channel Setting Sensing Sensitivity: 0.3 mV
Zone Setting Status: 755011
Zone Setting Status: 755011

## 2024-01-01 ENCOUNTER — Encounter: Payer: Self-pay | Admitting: Cardiovascular Disease

## 2024-01-03 ENCOUNTER — Other Ambulatory Visit: Payer: Self-pay | Admitting: Cardiovascular Disease

## 2024-01-26 ENCOUNTER — Other Ambulatory Visit: Payer: Self-pay | Admitting: Cardiovascular Disease

## 2024-01-26 DIAGNOSIS — I480 Paroxysmal atrial fibrillation: Secondary | ICD-10-CM

## 2024-01-28 NOTE — Telephone Encounter (Signed)
 Prescription refill request for Eliquis received. Indication:afib Last office visit:11/24 Scr:0.77  11/24 Age: 63 Weight:82.2  kg  Prescription refilled

## 2024-01-31 NOTE — Progress Notes (Signed)
 Remote ICD transmission.

## 2024-01-31 NOTE — Addendum Note (Signed)
 Addended by: Elease Etienne A on: 01/31/2024 11:29 AM   Modules accepted: Orders

## 2024-02-18 ENCOUNTER — Ambulatory Visit: Payer: Medicare HMO | Admitting: Cardiovascular Disease

## 2024-03-12 ENCOUNTER — Ambulatory Visit: Attending: Cardiovascular Disease | Admitting: Cardiovascular Disease

## 2024-03-12 ENCOUNTER — Encounter: Payer: Self-pay | Admitting: Cardiovascular Disease

## 2024-03-12 VITALS — BP 130/70 | HR 63 | Ht 73.0 in | Wt 170.6 lb

## 2024-03-12 DIAGNOSIS — I2102 ST elevation (STEMI) myocardial infarction involving left anterior descending coronary artery: Secondary | ICD-10-CM | POA: Diagnosis not present

## 2024-03-12 DIAGNOSIS — I255 Ischemic cardiomyopathy: Secondary | ICD-10-CM

## 2024-03-12 DIAGNOSIS — I480 Paroxysmal atrial fibrillation: Secondary | ICD-10-CM

## 2024-03-12 DIAGNOSIS — E785 Hyperlipidemia, unspecified: Secondary | ICD-10-CM

## 2024-03-12 NOTE — Assessment & Plan Note (Signed)
 History of PAF captured on remote telemetry from his ICD reviewed by Dr. Alvis Ba.  The patient was placed on Eliquis  oral anticoagulation.  His EKG from November revealed sinus rhythm.

## 2024-03-12 NOTE — Assessment & Plan Note (Signed)
 History of ischemic cardiomyopathy with an EF in the 30 to 35% range by 2D echo performed 03/15/2023.  [name] on GDMT and is asymptomatic.

## 2024-03-12 NOTE — Patient Instructions (Addendum)
 Medication Instructions:  Your physician recommends that you continue on your current medications as directed. Please refer to the Current Medication list given to you today.  *If you need a refill on your cardiac medications before your next appointment, please call your pharmacy*   Follow-Up: At [facility], you and your health needs are our priority.  As part of our continuing mission to provide you with exceptional heart care, our providers are all part of one team.  This team includes your primary Cardiologist (physician) and Advanced Practice Providers or APPs (Physician Assistants and Nurse Practitioners) who all work together to provide you with the care you need, when you need it.  Your next appointment:   6 month(s)  Provider:   Marcie Sever, PA-C, Callie Goodrich, PA-C, Hao Meng, PA-C, Marlana Silvan, NP, or Katlyn West, NP         Then, Lauro Portal, MD will plan to see you again in 12 month(s).   We recommend signing up for the patient portal called "MyChart".  Sign up information is provided on this After Visit Summary.  MyChart is used to connect with patients for Virtual Visits (Telemedicine).  Patients are able to view lab/test results, encounter notes, upcoming appointments, etc.  Non-urgent messages can be sent to your provider as well.   To learn more about what you can do with MyChart, go to [email or web address].

## 2024-03-12 NOTE — Assessment & Plan Note (Signed)
 History of anterior wall STEMI 06/12/2015 treated with stenting of his mid LAD by myself with a drug-eluting stent via the right radial approach.  He did have a hemorrhagic pericardial effusion with tamponade after that requiring subxiphoid pericardial window by Dr. Alva Jewels.  He has been stable since.

## 2024-03-12 NOTE — Progress Notes (Signed)
 03/12/2024 Devin Ellis   1961-01-26  [id number]  Primary Physician Corrington, Irma Mans, MD Primary Cardiologist: Avanell Leigh MD Bennye Bravo, [place]  HPI:  Devin Ellis is a 63 y.o.   thin-appearing married Caucasian male father of 5 children who worked at [employer] in the past but has been unemployed since his heart attack.Devin Ellis  He just started seeing a new primary care physician at [facility].  I last saw him in the office 02/13/23.  [name] accompanied by his wife Devin Ellis today.  He had an acute anterior wall myocardial infarction on 06/12/15 treated with stenting of his mid LAD with a drug-eluting stent in the right radial approach by myself. His EF at that time was 25-35% with anteroapical wall motion and mobility. He had a 60% AV groove circumflex stenosis. His risk factor profile is notable for 80 pack years of tobacco smoking 2 packs a day up until the point now smoking 4 cigarettes a day. 2 weeks post discharge from the hospital he was admitted with pericardial tamponade due to  hemorrhagic pericardial effusion undergoing a subxiphoid pericardial window by Dr. Alva Jewels . Since discharge she still remarkably well. He has no shortness of breath or chest pain. I released him to go back to work.  A 2-D echo recently performed 11/06/16 revealed a decline in his ejection fraction to 30-35% range. Based on this, I referred him to Dr. Alvis Ba for evaluation of ICD implantation for primary prevention. This was performed successfully on 01/10/17 and he has been followed regularly since.    Since I saw him a year ago he is remained stable.  He denies chest pain or shortness of breath.  He walks his dog and does yard work without symptoms.  He continues to smoke 4 cigarettes a day.  He was found to have asymptomatic A. fib on [facility] interrogation followed by Dr. Alvis Ba and was begun on Eliquis  his 2D echo from 1 year ago revealed an EF of 30 to 35% which has remained stable.   Current Meds  Medication  Sig   acetaminophen  (TYLENOL ) 325 MG tablet Take 2 tablets (650 mg total) by mouth every 4 (four) hours as needed for headache or mild pain.   aspirin  EC 81 MG tablet Take 81 mg by mouth daily.   atorvastatin  (LIPITOR ) 20 MG tablet TAKE 1 TABLET BY MOUTH EVERYDAY AT BEDTIME   carvedilol  (COREG ) 12.5 MG tablet TAKE 1 TABLET BY MOUTH 2 TIMES DAILY.   [facility] VITAMIN B12 1000 MCG tablet Take 1,000 mcg by mouth daily.   ELIQUIS  5 MG TABS tablet TAKE 1 TABLET BY MOUTH TWICE A DAY   esomeprazole (NEXIUM) 40 MG capsule Take 40 mg by mouth daily.   fenofibrate  160 MG tablet TAKE 1 TABLET BY MOUTH EVERY DAY   folic acid  (FOLVITE ) 1 MG tablet Take 1 mg by mouth daily.   gabapentin (NEURONTIN) 800 MG tablet Take 800 mg by mouth 3 (three) times daily.   lisinopril  (ZESTRIL ) 2.5 MG tablet TAKE 1 TABLET BY MOUTH EVERY DAY   metFORMIN (GLUCOPHAGE-XR) 500 MG 24 hr tablet Take 500 mg by mouth every morning.   methocarbamol (ROBAXIN) 500 MG tablet Take 500 mg by mouth 3 (three) times daily.   Needles & Syringes (1CC TB SYRINGE) MISC 27g 1/2" SQ weekly with Methotrexate for 90 days   nitroGLYCERIN  (NITROSTAT ) 0.4 MG SL tablet Place 1 tablet (0.4 mg total) under the tongue every 5 (five) minutes as needed for  chest pain.   traZODone (DESYREL) 100 MG tablet Take 100 mg by mouth at bedtime.   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  Tobacco Use   Smoking status: Every Day    Current packs/day: 0.25    Average packs/day: 0.3 packs/day for 30.0 years (7.5 ttl pk-yrs)    Types: Cigarettes   Smokeless tobacco: Never   Tobacco comments:    09/02/2024 patient  smokes about 3-4 cigarettes daily depending on the day.  Substance and Sexual Activity   Alcohol use: Not Currently    Alcohol/week: 0.0 standard drinks of alcohol   Drug use: Never   Sexual activity: Not on file  Other Topics Concern   Not on file  Social History Narrative   Married.  Five children total between him and his wife.  He works for [employer] in [employer].     Social Drivers of Health   Financial Resource Strain: Medium Risk (02/13/2024)   Received from [facility]   Overall Financial Resource Strain (CARDIA)    Difficulty of Paying Living Expenses: Somewhat hard  Food Insecurity: Food Insecurity Present (02/13/2024)   [facility]   Hunger Vital Sign    Worried About Running Out of Food in the Last Year: Sometimes true    Ran Out of Food in the Last Year: Sometimes true  Transportation Needs: No Transportation Needs (02/13/2024)   Received from [facility] - Transportation    Lack of Transportation (Medical): No    Lack of Transportation (Non-Medical): No  Physical Activity: Sufficiently Active (02/13/2024)   Received from [facility]   Exercise Vital Sign    Days of Exercise per Week: 7 days    Minutes of Exercise per Session: 90 min  Stress: No Stress Concern Present (02/13/2024)   Received from [facility] - Occupational Stress Questionnaire    Feeling of Stress : Only a little  Social Connections: Moderately Integrated (02/13/2024)   Received from [facility]   Social Network    How would you rate your social network (family, work, friends)?: Adequate participation with social networks  Intimate Partner Violence: Not At Risk (02/13/2024)   Received from [facility]   HITS    Over the last 12 months how often did your partner physically hurt you?: Never    Over the last 12 months how often did your partner insult you or talk down to you?: Never    Over the last 12 months how  often did your partner threaten you with physical harm?: Never    Over the last 12 months how often did your partner scream or curse at you?: Never     Review of Systems: General: negative for chills, fever, night sweats or weight changes.  Cardiovascular: negative for chest pain, dyspnea on exertion, edema, orthopnea, palpitations, paroxysmal nocturnal dyspnea or shortness of breath Dermatological: negative for rash Respiratory: negative for cough or wheezing Urologic: negative for hematuria Abdominal: negative for nausea, vomiting, diarrhea, bright red blood per rectum, melena, or hematemesis  Neurologic: negative for visual changes, syncope, or dizziness All other systems reviewed and are otherwise negative except as noted above.    Blood pressure 130/70, pulse 63, height 6\' 1"  (1.854 m), weight 170 lb 9.6 oz (77.4 kg), SpO2 95%.  General appearance: alert and no distress Neck: no adenopathy, no carotid bruit, no JVD, supple, symmetrical, trachea midline, and thyroid not enlarged, symmetric, no tenderness/mass/nodules Lungs: clear to auscultation bilaterally Heart: regular rate and rhythm, S1, S2 normal, no murmur, click, rub or gallop Extremities: extremities normal, atraumatic, no cyanosis or edema Pulses: 2+ and symmetric Skin: Skin color, texture, turgor normal. No rashes or lesions Neurologic: Grossly normal  EKG EKG Interpretation Date/Time:  Wednesday Mar 12 2024 09:16:31 EDT Ventricular Rate:  59 PR Interval:  146 QRS Duration:  98 QT Interval:  388 QTC Calculation: 384 R Axis:   63  Text Interpretation: Sinus bradycardia Septal infarct (cited on or before 17-Aug-2020) Possible Lateral infarct , age undetermined When compared with ECG of 03-Sep-2023 08:10, No significant change was found Confirmed by Lauro Portal [phone]) on 03/12/2024 9:18:03 AM    ASSESSMENT AND PLAN:   ST elevation (STEMI) myocardial infarction involving left anterior descending coronary artery  (HCC) History of anterior wall STEMI 06/12/2015 treated with stenting of his mid LAD by myself with a drug-eluting stent via the right radial approach.  He did have a hemorrhagic pericardial effusion with tamponade after that requiring subxiphoid pericardial window by Dr. Alva Jewels.  He has been stable since.  Hyperlipidemia LDL goal <70 History of hyperlipidemia on statin therapy with lipid profile performed 11//24 revealing total cholesterol 115, LDL 53 and HDL 40.  Ischemic cardiomyopathy History of ischemic cardiomyopathy with an EF in the 30 to 35% range by 2D echo performed 03/15/2023.  [name] on GDMT and is asymptomatic.  Paroxysmal atrial fibrillation (HCC) History of PAF captured on remote telemetry from his ICD reviewed by Dr. Alvis Ba.  The patient was placed on Eliquis  oral anticoagulation.  His EKG from November revealed sinus rhythm.     Avanell Leigh MD FACP,FACC,FAHA, [facility] 03/12/2024 9:34 AM

## 2024-03-12 NOTE — Assessment & Plan Note (Signed)
 History of hyperlipidemia on statin therapy with lipid profile performed 11//24 revealing total cholesterol 115, LDL 53 and HDL 40.

## 2024-03-25 ENCOUNTER — Ambulatory Visit (INDEPENDENT_AMBULATORY_CARE_PROVIDER_SITE_OTHER): Payer: Medicare HMO

## 2024-03-25 DIAGNOSIS — I255 Ischemic cardiomyopathy: Secondary | ICD-10-CM | POA: Diagnosis not present

## 2024-03-25 DIAGNOSIS — I5042 Chronic combined systolic (congestive) and diastolic (congestive) heart failure: Secondary | ICD-10-CM

## 2024-03-26 LAB — CUP PACEART REMOTE DEVICE CHECK
Battery Remaining Longevity: 44 mo
Battery Voltage: 2.97 V
Brady Statistic RV Percent Paced: 0.01 %
Date Time Interrogation Session: 20250527023327
HighPow Impedance: 78 Ohm
Implantable Lead Connection Status: 753985
Implantable Lead Implant Date: 20180314
Implantable Lead Location: 753860
Implantable Pulse Generator Implant Date: 20180314
Lead Channel Impedance Value: 399 Ohm
Lead Channel Impedance Value: 513 Ohm
Lead Channel Pacing Threshold Amplitude: 0.5 V
Lead Channel Pacing Threshold Pulse Width: 0.4 ms
Lead Channel Sensing Intrinsic Amplitude: 14.375 mV
Lead Channel Sensing Intrinsic Amplitude: 14.375 mV
Lead Channel Setting Pacing Amplitude: 2.5 V
Lead Channel Setting Pacing Pulse Width: 0.4 ms
Lead Channel Setting Sensing Sensitivity: 0.3 mV
Zone Setting Status: 755011
Zone Setting Status: 755011

## 2024-04-02 ENCOUNTER — Ambulatory Visit: Payer: Self-pay | Admitting: Cardiovascular Disease

## 2024-05-14 NOTE — Progress Notes (Signed)
 Remote ICD transmission.

## 2024-05-14 NOTE — Addendum Note (Signed)
 Addended by: TAWNI DRILLING D on: 05/14/2024 10:37 AM   Modules accepted: Orders

## 2024-06-24 ENCOUNTER — Ambulatory Visit: Payer: Medicare HMO

## 2024-07-18 ENCOUNTER — Other Ambulatory Visit: Payer: Self-pay | Admitting: Cardiovascular Disease

## 2024-07-18 DIAGNOSIS — I480 Paroxysmal atrial fibrillation: Secondary | ICD-10-CM

## 2024-07-18 NOTE — Telephone Encounter (Signed)
 Prescription refill request for Eliquis  received. Indication:afib Last office visit:5/25 Scr:0.77  11/24 Age: 63 Weight:77.4  kg  Prescription refilled

## 2024-09-01 LAB — COLOGUARD: COLOGUARD: NEGATIVE

## 2024-09-16 ENCOUNTER — Ambulatory Visit: Admitting: Cardiovascular Disease

## 2024-09-18 ENCOUNTER — Other Ambulatory Visit: Payer: Self-pay | Admitting: Cardiovascular Disease

## 2024-09-18 DIAGNOSIS — E782 Mixed hyperlipidemia: Secondary | ICD-10-CM

## 2024-09-22 MED ORDER — LISINOPRIL 2.5 MG PO TABS
2.5000 mg | ORAL_TABLET | Freq: Every day | ORAL | 1 refills | Status: AC
Start: 2024-09-22 — End: ?

## 2024-09-22 MED ORDER — CARVEDILOL 12.5 MG PO TABS: 12.5000 mg | ORAL_TABLET | Freq: Two times a day (BID) | ORAL | 1 refills | Status: AC

## 2024-09-23 ENCOUNTER — Ambulatory Visit: Payer: Medicare HMO

## 2024-09-23 DIAGNOSIS — I480 Paroxysmal atrial fibrillation: Secondary | ICD-10-CM | POA: Diagnosis not present

## 2024-09-24 LAB — CUP PACEART REMOTE DEVICE CHECK
Battery Remaining Longevity: 34 mo
Battery Voltage: 2.97 V
Brady Statistic RV Percent Paced: 0.01 %
Date Time Interrogation Session: 20251125001605
HighPow Impedance: 69 Ohm
Implantable Lead Connection Status: 753985
Implantable Lead Implant Date: 20180314
Implantable Lead Location: 753860
Implantable Pulse Generator Implant Date: 20180314
Lead Channel Impedance Value: 399 Ohm
Lead Channel Impedance Value: 456 Ohm
Lead Channel Pacing Threshold Amplitude: 0.875 V
Lead Channel Pacing Threshold Pulse Width: 0.4 ms
Lead Channel Sensing Intrinsic Amplitude: 13.875 mV
Lead Channel Sensing Intrinsic Amplitude: 13.875 mV
Lead Channel Setting Pacing Amplitude: 2.5 V
Lead Channel Setting Pacing Pulse Width: 0.4 ms
Lead Channel Setting Sensing Sensitivity: 0.3 mV
Zone Setting Status: 755011
Zone Setting Status: 755011

## 2024-09-26 ENCOUNTER — Ambulatory Visit: Payer: Self-pay | Admitting: Cardiovascular Disease

## 2024-09-29 NOTE — Progress Notes (Signed)
 Remote ICD Transmission

## 2024-12-15 ENCOUNTER — Ambulatory Visit: Admitting: Cardiovascular Disease

## 2024-12-23 ENCOUNTER — Ambulatory Visit: Payer: Medicare HMO
